# Patient Record
Sex: Male | Born: 1945 | Race: White | Hispanic: No | Smoking: Former smoker
Health system: Southern US, Community
[De-identification: ages and names within clinical notes are randomized; demographics above are authoritative.]

## PROBLEM LIST (undated history)

## (undated) DIAGNOSIS — J9691 Respiratory failure, unspecified with hypoxia: Secondary | ICD-10-CM

## (undated) DIAGNOSIS — Z8709 Personal history of other diseases of the respiratory system: Secondary | ICD-10-CM

## (undated) DIAGNOSIS — I10 Essential (primary) hypertension: Secondary | ICD-10-CM

## (undated) DIAGNOSIS — J449 Chronic obstructive pulmonary disease, unspecified: Secondary | ICD-10-CM

## (undated) DIAGNOSIS — Z9889 Other specified postprocedural states: Secondary | ICD-10-CM

## (undated) HISTORY — PX: HEMORRHOID SURGERY: SHX153

## (undated) HISTORY — DX: Other specified postprocedural states: Z98.890

## (undated) HISTORY — PX: CHOLECYSTECTOMY: SHX55

## (undated) HISTORY — DX: Essential (primary) hypertension: I10

## (undated) HISTORY — PX: CARDIAC CATHETERIZATION: SHX172

## (undated) HISTORY — DX: Respiratory failure, unspecified with hypoxia: J96.91

---

## 1996-10-28 HISTORY — PX: VASECTOMY: SHX75

## 2006-10-28 HISTORY — PX: HERNIA REPAIR: SHX51

## 2008-10-28 DIAGNOSIS — Z8709 Personal history of other diseases of the respiratory system: Secondary | ICD-10-CM

## 2008-10-28 HISTORY — DX: Personal history of other diseases of the respiratory system: Z87.09

## 2010-10-28 DIAGNOSIS — Z9889 Other specified postprocedural states: Secondary | ICD-10-CM

## 2010-10-28 HISTORY — DX: Other specified postprocedural states: Z98.890

## 2017-12-08 ENCOUNTER — Other Ambulatory Visit: Payer: Self-pay | Admitting: Urology

## 2017-12-12 NOTE — Patient Instructions (Addendum)
Timothy [name]  12/12/2017   Your procedure is scheduled on: 12-17-17   Report to [facility] Main  Entrance Report to Admitting at 6:30 AM   Call this number if you have problems the morning of surgery [phone]   Remember: Do not eat food or drink liquids :After Midnight.     Take these medicines the morning of surgery with A SIP OF WATER: None                                You may not have any metal on your body including hair pins and              piercings  Do not wear jewelry, lotions, powders or deodorant             Men may shave face and neck.   Do not bring valuables to the hospital. [place].  Contacts, dentures or bridgework may not be worn into surgery.      Patients discharged the day of surgery will not be allowed to drive home.  Name and phone number of your driver: Timothy [name] [phone]               Please read over the following fact sheets you were given: _____________________________________________________________________             [facility] - Preparing for Surgery Before surgery, you can play an important role.  Because skin is not sterile, your skin needs to be as [place] of germs as possible.  You can reduce the number of germs on your skin by washing with CHG (chlorahexidine gluconate) soap before surgery.  CHG is an antiseptic cleaner which kills germs and bonds with the skin to continue killing germs even after washing. Please DO NOT use if you have an allergy to CHG or antibacterial soaps.  If your skin becomes reddened/irritated stop using the CHG and inform your nurse when you arrive at Short Stay. Do not shave (including legs and underarms) for at least 48 hours prior to the first CHG shower.  You may shave your face/neck. Please follow these instructions carefully:  1.  Shower with CHG Soap the night before surgery and the  morning of Surgery.  2.  If you choose to  wash your hair, wash your hair first as usual with your  normal  shampoo.  3.  After you shampoo, rinse your hair and body thoroughly to remove the  shampoo.                           4.  Use CHG as you would any other liquid soap.  You can apply chg directly  to the skin and wash                       Gently with a scrungie or clean washcloth.  5.  Apply the CHG Soap to your body ONLY FROM THE NECK DOWN.   Do not use on face/ open                           Wound or open sores.  Avoid contact with eyes, ears mouth and genitals (private parts).                       Wash face,  Genitals (private parts) with your normal soap.             6.  Wash thoroughly, paying special attention to the area where your surgery  will be performed.  7.  Thoroughly rinse your body with warm water from the neck down.  8.  DO NOT shower/wash with your normal soap after using and rinsing off  the CHG Soap.                9.  Pat yourself dry with a clean towel.            10.  Wear clean pajamas.            11.  Place clean sheets on your bed the night of your first shower and do not  sleep with pets. Day of Surgery : Do not apply any lotions/deodorants the morning of surgery.  Please wear clean clothes to the hospital/surgery center.  FAILURE TO FOLLOW THESE INSTRUCTIONS MAY RESULT IN THE CANCELLATION OF YOUR SURGERY PATIENT SIGNATURE_________________________________  NURSE SIGNATURE__________________________________  ________________________________________________________________________

## 2017-12-15 ENCOUNTER — Encounter (HOSPITAL_COMMUNITY): Payer: Self-pay

## 2017-12-15 ENCOUNTER — Other Ambulatory Visit: Payer: Self-pay

## 2017-12-15 ENCOUNTER — Encounter (HOSPITAL_COMMUNITY)
Admission: RE | Admit: 2017-12-15 | Discharge: 2017-12-15 | Disposition: A | Discharge status: Home / Self Care | Payer: Medicare Other | Source: Ambulatory Visit | Attending: Urology | Admitting: Urology

## 2017-12-15 DIAGNOSIS — Z01812 Encounter for preprocedural laboratory examination: Secondary | ICD-10-CM | POA: Diagnosis not present

## 2017-12-15 DIAGNOSIS — I10 Essential (primary) hypertension: Secondary | ICD-10-CM | POA: Diagnosis not present

## 2017-12-15 DIAGNOSIS — J449 Chronic obstructive pulmonary disease, unspecified: Secondary | ICD-10-CM | POA: Diagnosis not present

## 2017-12-15 DIAGNOSIS — Z7951 Long term (current) use of inhaled steroids: Secondary | ICD-10-CM | POA: Diagnosis not present

## 2017-12-15 DIAGNOSIS — Z87891 Personal history of nicotine dependence: Secondary | ICD-10-CM | POA: Diagnosis not present

## 2017-12-15 DIAGNOSIS — Z9114 Patient's other noncompliance with medication regimen: Secondary | ICD-10-CM | POA: Diagnosis not present

## 2017-12-15 DIAGNOSIS — C674 Malignant neoplasm of posterior wall of bladder: Secondary | ICD-10-CM | POA: Diagnosis not present

## 2017-12-15 DIAGNOSIS — D494 Neoplasm of unspecified behavior of bladder: Secondary | ICD-10-CM | POA: Diagnosis present

## 2017-12-15 DIAGNOSIS — Z01810 Encounter for preprocedural cardiovascular examination: Secondary | ICD-10-CM | POA: Diagnosis not present

## 2017-12-15 DIAGNOSIS — Z77090 Contact with and (suspected) exposure to asbestos: Secondary | ICD-10-CM | POA: Diagnosis not present

## 2017-12-15 HISTORY — DX: Personal history of other diseases of the respiratory system: Z87.09

## 2017-12-15 HISTORY — DX: Chronic obstructive pulmonary disease, unspecified: J44.9

## 2017-12-15 LAB — CBC
HCT: 45.6 % (ref 39.0–52.0)
Hemoglobin: 15.3 g/dL (ref 13.0–17.0)
MCH: 31.3 pg (ref 26.0–34.0)
MCHC: 33.6 g/dL (ref 30.0–36.0)
MCV: 93.3 fL (ref 78.0–100.0)
PLATELETS: 212 10*3/uL (ref 150–400)
RBC: 4.89 MIL/uL (ref 4.22–5.81)
RDW: 13.4 % (ref 11.5–15.5)
WBC: 6.4 10*3/uL (ref 4.0–10.5)

## 2017-12-15 LAB — BASIC METABOLIC PANEL
Anion gap: 9 (ref 5–15)
BUN: 12 mg/dL (ref 6–20)
CALCIUM: 9.1 mg/dL (ref 8.9–10.3)
CO2: 25 mmol/L (ref 22–32)
CREATININE: 0.87 mg/dL (ref 0.61–1.24)
Chloride: 108 mmol/L (ref 101–111)
GFR calc Af Amer: >60 mL/min (ref >60)
Glucose, Bld: 103 mg/dL — ABNORMAL HIGH (ref 65–99)
Potassium: 4.9 mmol/L (ref 3.5–5.1)
SODIUM: 142 mmol/L (ref 135–145)

## 2017-12-15 NOTE — H&P (Signed)
Urology Preoperative H&P   Chief Complaint: bladder lesion  History of Present Illness: Timothy [name] is a 72 y.o. male initially seen in the [place], [place] emergency department in November 2018 with painless gross hematuria.  He had a CT performed at that time that demonstrated a large bladder lesion involving the posterior aspects of the bladder wall.  No other upper urinary tract lesions noted on his CT.  He previously smoked 2 ppd for 40 years, but quit in approximately 15 years ago.  He has no prior history of kidney stones or UTIs.  He denies a personal/family history of GU malignancies.       Past Medical History:  Diagnosis Date  . COPD (chronic obstructive pulmonary disease) ([place])   . History of asbestosis 2010  . Hypertension    2010-prescribed Lisinopril, non-complinant with medication    Past Surgical History:  Procedure Laterality Date  . CHOLECYSTECTOMY     2015  . HEMORRHOID SURGERY    . HERNIA REPAIR  2008   Abdominal   . VASECTOMY  1998    Allergies: No Known Allergies  No family history on file.  Social History:  reports that he quit smoking about 15 years ago. His smoking use included cigarettes. He has a 40.00 pack-year smoking history. he has never used smokeless tobacco. He reports that he does not use drugs. His alcohol history is not on file.  ROS: A complete review of systems was performed.  All systems are negative except for pertinent findings as noted.  Physical Exam:  Vital signs in last 24 hours: Temp:  [98.5 F (36.9 C)] 98.5 F (36.9 C) (02/18 1345) Pulse Rate:  [67] 67 (02/18 1345) Resp:  [20] 20 (02/18 1345) BP: (145)/(82) 145/82 (02/18 1345) SpO2:  [98 %] 98 % (02/18 1345) Weight:  [73.6 kg (162 lb 4 oz)] 73.6 kg (162 lb 4 oz) (02/18 1345) Constitutional:  Alert and oriented, No acute distress Cardiovascular: Regular rate and rhythm, No JVD Respiratory: Normal respiratory effort, Lungs clear bilaterally GI: Abdomen is soft, nontender,  nondistended, no abdominal masses GU: No CVA tenderness Lymphatic: No lymphadenopathy Neurologic: Grossly intact, no focal deficits Psychiatric: Normal mood and affect   Laboratory Data:  Recent Labs    12/15/17 1430  WBC 6.4  HGB 15.3  HCT 45.6  PLT 212    Recent Labs    12/15/17 1430  NA 142  K 4.9  CL 108  GLUCOSE 103*  BUN 12  CALCIUM 9.1  CREATININE 0.87     Results for orders placed or performed during the hospital encounter of 12/15/17 (from the past 24 hour(s))  Basic metabolic panel     Status: Abnormal   Collection Time: 12/15/17  2:30 PM  Result Value Ref Range   Sodium 142 135 - 145 mmol/L   Potassium 4.9 3.5 - 5.1 mmol/L   Chloride 108 101 - 111 mmol/L   CO2 25 22 - 32 mmol/L   Glucose, Bld 103 (H) 65 - 99 mg/dL   BUN 12 6 - 20 mg/dL   Creatinine, Ser 0.87 0.61 - 1.24 mg/dL   Calcium 9.1 8.9 - 10.3 mg/dL   GFR calc non Af Amer >60 >60 mL/min   GFR calc Af Amer >60 >60 mL/min   Anion gap 9 5 - 15  CBC     Status: None   Collection Time: 12/15/17  2:30 PM  Result Value Ref Range   WBC 6.4 4.0 - 10.5 K/uL  RBC 4.89 4.22 - 5.81 MIL/uL   Hemoglobin 15.3 13.0 - 17.0 g/dL   HCT 45.6 39.0 - 52.0 %   MCV 93.3 78.0 - 100.0 fL   MCH 31.3 26.0 - 34.0 pg   MCHC 33.6 30.0 - 36.0 g/dL   RDW 13.4 11.5 - 15.5 %   Platelets 212 150 - 400 K/uL   No results found for this or any previous visit (from the past 240 hour(s)).  Renal Function: Recent Labs    12/15/17 1430  CREATININE 0.87   Estimated Creatinine Clearance: 76.7 mL/min (by C-G formula based on SCr of 0.87 mg/dL).  Radiologic Imaging: No results found.  I independently reviewed the above imaging studies.  Assessment and Plan Son Barkan Prezioso is a 72 y.o. male with gross hematuria and a bladder lesion on recent CT   -The risks, benefits and alternatives of cystoscopy with transurethral resection of bladder tumor was discussed with the patient. Risks include bleeding, urinary tract infection,  bladder perforation, the need for multiple surgeries to fully resect the tumor, the possibility for prolonged ureteral catheterization and the inherent risks of general anesthesia. He voices understanding and wishes to proceed.   Ellison Hughs, MD 12/15/2017, 7:55 PM  [facility] Pager: [phone]

## 2017-12-16 NOTE — Anesthesia Preprocedure Evaluation (Addendum)
Anesthesia Evaluation  Patient identified by MRN, date of birth, ID band Patient awake    Reviewed: Allergy & Precautions, NPO status , Patient's Chart, lab work & pertinent test results  Airway Mallampati: II  TM Distance: >3 FB Neck ROM: Full    Dental  (+) Missing,    Pulmonary COPD, former smoker,     + decreased breath sounds      Cardiovascular hypertension, negative cardio ROS   Rhythm:Regular Rate:Normal     Neuro/Psych negative neurological ROS  negative psych ROS   GI/Hepatic negative GI ROS, Neg liver ROS,   Endo/Other  negative endocrine ROS  Renal/GU negative Renal ROS  negative genitourinary   Musculoskeletal negative musculoskeletal ROS (+)   Abdominal Normal abdominal exam  (+)   Peds  Hematology negative hematology ROS (+)   Anesthesia Other Findings   Reproductive/Obstetrics                            Lab Results  Component Value Date   WBC 6.4 12/15/2017   HGB 15.3 12/15/2017   HCT 45.6 12/15/2017   MCV 93.3 12/15/2017   PLT 212 12/15/2017   Lab Results  Component Value Date   CREATININE 0.87 12/15/2017   BUN 12 12/15/2017   NA 142 12/15/2017   K 4.9 12/15/2017   CL 108 12/15/2017   CO2 25 12/15/2017   No results found for: INR, PROTIME  EKG: normal sinus rhythm, PAC's noted.   Anesthesia Physical Anesthesia Plan  ASA: III  Anesthesia Plan: General   Post-op Pain Management:    Induction: Intravenous  PONV Risk Score and Plan: Treatment may vary due to age or medical condition, Ondansetron and Dexamethasone  Airway Management Planned: LMA  Additional Equipment:   Intra-op Plan:   Post-operative Plan: Extubation in OR  Informed Consent: I have reviewed the patients History and Physical, chart, labs and discussed the procedure including the risks, benefits and alternatives for the proposed anesthesia with the patient or authorized  representative who has indicated his/her understanding and acceptance.   Dental advisory given  Plan Discussed with: Anesthesiologist and CRNA  Anesthesia Plan Comments:         Anesthesia Quick Evaluation

## 2017-12-17 ENCOUNTER — Ambulatory Visit (HOSPITAL_COMMUNITY): Payer: Medicare Other

## 2017-12-17 ENCOUNTER — Ambulatory Visit (HOSPITAL_COMMUNITY): Payer: Medicare Other | Admitting: Anesthesiology

## 2017-12-17 ENCOUNTER — Encounter (HOSPITAL_COMMUNITY)
Admission: RE | Disposition: A | Discharge status: Home / Self Care | Payer: Self-pay | Source: Ambulatory Visit | Attending: Urology

## 2017-12-17 ENCOUNTER — Ambulatory Visit (HOSPITAL_COMMUNITY)
Admission: RE | Admit: 2017-12-17 | Discharge: 2017-12-17 | Disposition: A | Discharge status: Home / Self Care | Payer: Medicare Other | Source: Ambulatory Visit | Attending: Urology | Admitting: Urology

## 2017-12-17 ENCOUNTER — Encounter (HOSPITAL_COMMUNITY): Payer: Self-pay

## 2017-12-17 DIAGNOSIS — Z77090 Contact with and (suspected) exposure to asbestos: Secondary | ICD-10-CM | POA: Insufficient documentation

## 2017-12-17 DIAGNOSIS — I10 Essential (primary) hypertension: Secondary | ICD-10-CM | POA: Insufficient documentation

## 2017-12-17 DIAGNOSIS — C674 Malignant neoplasm of posterior wall of bladder: Secondary | ICD-10-CM | POA: Diagnosis not present

## 2017-12-17 DIAGNOSIS — Z7951 Long term (current) use of inhaled steroids: Secondary | ICD-10-CM | POA: Insufficient documentation

## 2017-12-17 DIAGNOSIS — Z01810 Encounter for preprocedural cardiovascular examination: Secondary | ICD-10-CM | POA: Insufficient documentation

## 2017-12-17 DIAGNOSIS — Z01812 Encounter for preprocedural laboratory examination: Secondary | ICD-10-CM | POA: Diagnosis not present

## 2017-12-17 DIAGNOSIS — Z9114 Patient's other noncompliance with medication regimen: Secondary | ICD-10-CM | POA: Insufficient documentation

## 2017-12-17 DIAGNOSIS — Z87891 Personal history of nicotine dependence: Secondary | ICD-10-CM | POA: Insufficient documentation

## 2017-12-17 DIAGNOSIS — J449 Chronic obstructive pulmonary disease, unspecified: Secondary | ICD-10-CM | POA: Insufficient documentation

## 2017-12-17 HISTORY — PX: CYSTOSCOPY W/ RETROGRADES: SHX1426

## 2017-12-17 HISTORY — PX: TRANSURETHRAL RESECTION OF BLADDER TUMOR: SHX2575

## 2017-12-17 SURGERY — TURBT (TRANSURETHRAL RESECTION OF BLADDER TUMOR)
Anesthesia: General

## 2017-12-17 MED ORDER — LACTATED RINGERS IV SOLN
INTRAVENOUS | Status: DC | PRN
Start: 2017-12-17 — End: 2017-12-17
  Administered 2017-12-17: 07:00:00 via INTRAVENOUS

## 2017-12-17 MED ORDER — ROCURONIUM BROMIDE 10 MG/ML (PF) SYRINGE
PREFILLED_SYRINGE | INTRAVENOUS | Status: AC
  Filled 2017-12-17: qty 5

## 2017-12-17 MED ORDER — IOHEXOL 300 MG/ML  SOLN
INTRAMUSCULAR | Status: DC | PRN
  Administered 2017-12-17: 10 mL

## 2017-12-17 MED ORDER — SUCCINYLCHOLINE CHLORIDE 200 MG/10ML IV SOSY
PREFILLED_SYRINGE | INTRAVENOUS | Status: AC
  Filled 2017-12-17: qty 10

## 2017-12-17 MED ORDER — HYDROMORPHONE HCL 1 MG/ML IJ SOLN
INTRAMUSCULAR | Status: AC
  Filled 2017-12-17: qty 1

## 2017-12-17 MED ORDER — HYDROMORPHONE HCL 1 MG/ML IJ SOLN
0.2500 mg | INTRAMUSCULAR | Status: DC | PRN
  Administered 2017-12-17: 0.25 mg via INTRAVENOUS

## 2017-12-17 MED ORDER — PHENAZOPYRIDINE HCL 200 MG PO TABS: 200.0000 mg | ORAL_TABLET | Freq: Three times a day (TID) | ORAL | 0 refills | Status: DC | PRN

## 2017-12-17 MED ORDER — PHENYLEPHRINE HCL 10 MG/ML IJ SOLN
INTRAMUSCULAR | Status: DC | PRN
  Administered 2017-12-17: 50 ug/min via INTRAVENOUS

## 2017-12-17 MED ORDER — ONDANSETRON HCL 4 MG/2ML IJ SOLN
INTRAMUSCULAR | Status: AC
  Filled 2017-12-17: qty 2

## 2017-12-17 MED ORDER — ONDANSETRON HCL 4 MG/2ML IJ SOLN
INTRAMUSCULAR | Status: DC | PRN
  Administered 2017-12-17: 4 mg via INTRAVENOUS

## 2017-12-17 MED ORDER — SUCCINYLCHOLINE CHLORIDE 20 MG/ML IJ SOLN
INTRAMUSCULAR | Status: DC | PRN
  Administered 2017-12-17: 120 mg via INTRAVENOUS

## 2017-12-17 MED ORDER — MEPERIDINE HCL 50 MG/ML IJ SOLN: 6.2500 mg | INTRAMUSCULAR | Status: DC | PRN

## 2017-12-17 MED ORDER — HYDROCODONE-ACETAMINOPHEN 5-325 MG PO TABS: 1.0000 | ORAL_TABLET | ORAL | 0 refills | Status: DC | PRN

## 2017-12-17 MED ORDER — LACTATED RINGERS IV SOLN: INTRAVENOUS | Status: DC

## 2017-12-17 MED ORDER — FENTANYL CITRATE (PF) 100 MCG/2ML IJ SOLN
INTRAMUSCULAR | Status: DC | PRN
  Administered 2017-12-17 (×2): 25 ug via INTRAVENOUS
  Administered 2017-12-17: 50 ug via INTRAVENOUS

## 2017-12-17 MED ORDER — PHENYLEPHRINE 40 MCG/ML (10ML) SYRINGE FOR IV PUSH (FOR BLOOD PRESSURE SUPPORT)
PREFILLED_SYRINGE | INTRAVENOUS | Status: AC
  Filled 2017-12-17: qty 10

## 2017-12-17 MED ORDER — PROPOFOL 10 MG/ML IV BOLUS
INTRAVENOUS | Status: AC
  Filled 2017-12-17: qty 20

## 2017-12-17 MED ORDER — PHENYLEPHRINE HCL 10 MG/ML IJ SOLN
INTRAMUSCULAR | Status: DC | PRN
  Administered 2017-12-17: 40 ug via INTRAVENOUS
  Administered 2017-12-17: 120 ug via INTRAVENOUS
  Administered 2017-12-17: 80 ug via INTRAVENOUS
  Administered 2017-12-17: 40 ug via INTRAVENOUS
  Administered 2017-12-17: 80 ug via INTRAVENOUS

## 2017-12-17 MED ORDER — PROPOFOL 10 MG/ML IV BOLUS
INTRAVENOUS | Status: DC | PRN
  Administered 2017-12-17: 160 mg via INTRAVENOUS
  Administered 2017-12-17: 40 mg via INTRAVENOUS

## 2017-12-17 MED ORDER — BELLADONNA ALKALOIDS-OPIUM 16.2-60 MG RE SUPP
RECTAL | Status: DC | PRN
  Administered 2017-12-17: 1 via RECTAL

## 2017-12-17 MED ORDER — SUGAMMADEX SODIUM 200 MG/2ML IV SOLN
INTRAVENOUS | Status: DC | PRN
  Administered 2017-12-17: 200 mg via INTRAVENOUS

## 2017-12-17 MED ORDER — DEXAMETHASONE SODIUM PHOSPHATE 10 MG/ML IJ SOLN
INTRAMUSCULAR | Status: AC
  Filled 2017-12-17: qty 1

## 2017-12-17 MED ORDER — SUGAMMADEX SODIUM 200 MG/2ML IV SOLN
INTRAVENOUS | Status: AC
  Filled 2017-12-17: qty 2

## 2017-12-17 MED ORDER — LIDOCAINE 2% (20 MG/ML) 5 ML SYRINGE
INTRAMUSCULAR | Status: AC
  Filled 2017-12-17: qty 5

## 2017-12-17 MED ORDER — CEFAZOLIN SODIUM-DEXTROSE 2-4 GM/100ML-% IV SOLN
2.0000 g | Freq: Once | INTRAVENOUS | Status: AC
  Administered 2017-12-17: 2 g via INTRAVENOUS
  Filled 2017-12-17: qty 100

## 2017-12-17 MED ORDER — SULFAMETHOXAZOLE-TRIMETHOPRIM 800-160 MG PO TABS: 1.0000 | ORAL_TABLET | Freq: Two times a day (BID) | ORAL | 0 refills | Status: AC

## 2017-12-17 MED ORDER — LIDOCAINE HCL (CARDIAC) 20 MG/ML IV SOLN
INTRAVENOUS | Status: DC | PRN
  Administered 2017-12-17 (×2): 50 mg via INTRAVENOUS

## 2017-12-17 MED ORDER — ROCURONIUM BROMIDE 100 MG/10ML IV SOLN
INTRAVENOUS | Status: DC | PRN
  Administered 2017-12-17: 30 mg via INTRAVENOUS

## 2017-12-17 MED ORDER — DEXAMETHASONE SODIUM PHOSPHATE 10 MG/ML IJ SOLN
INTRAMUSCULAR | Status: DC | PRN
  Administered 2017-12-17: 10 mg via INTRAVENOUS

## 2017-12-17 MED ORDER — OXYBUTYNIN CHLORIDE ER 10 MG PO TB24: 10.0000 mg | ORAL_TABLET | Freq: Every day | ORAL | 1 refills | Status: DC

## 2017-12-17 MED ORDER — PHENYLEPHRINE HCL 10 MG/ML IJ SOLN
INTRAMUSCULAR | Status: AC
  Filled 2017-12-17: qty 1

## 2017-12-17 MED ORDER — BELLADONNA-OPIUM 16.2-30 MG RE SUPP
RECTAL | Status: AC
  Filled 2017-12-17: qty 1

## 2017-12-17 MED ORDER — ONDANSETRON HCL 4 MG PO TABS: 4.0000 mg | ORAL_TABLET | Freq: Every day | ORAL | 1 refills | Status: DC | PRN

## 2017-12-17 MED ORDER — SODIUM CHLORIDE 0.9 % IR SOLN
Status: DC | PRN
  Administered 2017-12-17: 15000 mL via INTRAVESICAL

## 2017-12-17 MED ORDER — FENTANYL CITRATE (PF) 100 MCG/2ML IJ SOLN
INTRAMUSCULAR | Status: AC
  Filled 2017-12-17: qty 2

## 2017-12-17 MED ORDER — PROMETHAZINE HCL 25 MG/ML IJ SOLN: 6.2500 mg | INTRAMUSCULAR | Status: DC | PRN

## 2017-12-17 SURGICAL SUPPLY — 19 items
BAG URINE DRAINAGE (UROLOGICAL SUPPLIES) ×4 IMPLANT
BAG URO CATCHER STRL LF (MISCELLANEOUS) ×4 IMPLANT
CATH FOLEY 3WAY 30CC 20FR (CATHETERS) ×4 IMPLANT
CATH INTERMIT  6FR 70CM (CATHETERS) ×4 IMPLANT
CLOTH BEACON ORANGE TIMEOUT ST (SAFETY) ×4 IMPLANT
COVER FOOTSWITCH UNIV (MISCELLANEOUS) ×4 IMPLANT
COVER SURGICAL LIGHT HANDLE (MISCELLANEOUS) ×4 IMPLANT
ELECT REM PT RETURN 15FT ADLT (MISCELLANEOUS) ×4 IMPLANT
GLOVE BIOGEL M STRL SZ7.5 (GLOVE) ×4 IMPLANT
GOWN STRL REUS W/TWL LRG LVL3 (GOWN DISPOSABLE) ×8 IMPLANT
GOWN STRL REUS W/TWL XL LVL3 (GOWN DISPOSABLE) ×4 IMPLANT
GUIDEWIRE STR DUAL SENSOR (WIRE) ×4 IMPLANT
LOOP CUT BIPOLAR 24F LRG (ELECTROSURGICAL) ×4 IMPLANT
MANIFOLD NEPTUNE II (INSTRUMENTS) ×4 IMPLANT
PACK CYSTO (CUSTOM PROCEDURE TRAY) ×4 IMPLANT
SET ASPIRATION TUBING (TUBING) IMPLANT
SYRINGE IRR TOOMEY STRL 70CC (SYRINGE) ×4 IMPLANT
TUBING CONNECTING 10 (TUBING) ×3 IMPLANT
TUBING CONNECTING 10' (TUBING) ×1

## 2017-12-17 NOTE — Anesthesia Postprocedure Evaluation (Signed)
Anesthesia Post Note  Patient: Timothy [name]  Procedure(s) Performed: TRANSURETHRAL RESECTION OF BLADDER TUMOR (TURBT) (N/A ) CYSTOSCOPY WITH RETROGRADE PYELOGRAM (Bilateral )     Patient location during evaluation: PACU Anesthesia Type: General Level of consciousness: awake and alert Pain management: pain level controlled Vital Signs Assessment: post-procedure vital signs reviewed and stable Respiratory status: spontaneous breathing, nonlabored ventilation, respiratory function stable and patient connected to nasal cannula oxygen Cardiovascular status: blood pressure returned to baseline and stable Postop Assessment: no apparent nausea or vomiting Anesthetic complications: no    Last Vitals:  Vitals:   12/17/17 1100 12/17/17 1115  BP: (!) 175/89 (!) 175/92  Pulse: 78 93  Resp: 12 16  Temp: (!) 36.3 C   SpO2: 92% 90%    Last Pain:  Vitals:   12/17/17 1115  TempSrc:   PainSc: 0-No pain                 Effie Berkshire

## 2017-12-17 NOTE — Op Note (Signed)
Operative Note  Preoperative diagnosis:  1.  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  Cystoscopy with bilateral retrograde pyelograms 2.  TURBT medium  Surgeon: Ellison Hughs, MD  Assistants: None  Anesthesia: General endotracheal  Complications: None  EBL: 10 mL  Specimens: 1.  Superficial posterior bladder tumor 2.  Deep margin of posterior bladder tumor  Drains/Catheters: 1.  20 French three-way Foley catheter with 20 mL and the balloon  Intraoperative findings:  1.  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  Solitary right collecting system with no filling defects or dilation involving the right ureter or right renal pelvis seen on retrograde pyelogram 3.  Solitary left collecting system with no filling defects or dilation involving the left ureter or left renal pelvis seen on retrograde pyelogram  Indication:  Timothy [name] is a 72 y.o. male initially seen in the [place], [place] emergency department in November 2018 with painless gross hematuria.  He had a CT performed at that time that demonstrated a large bladder lesion involving the posterior aspects of the bladder wall.  No other upper urinary tract lesions noted on his CT.  He previously smoked 2 ppd for 40 years, but quit in approximately 15 years ago.  He has no prior history of kidney stones or UTIs.  He denies a personal/family history of GU malignancies.     He has been consented for the above procedures, voices understanding and wishes to proceed  Description of procedure:  After informed consent was obtained, the patient was brought to the operating room and general endotracheal anesthesia was administered. The patient was then placed in the dorsolithotomy position and prepped and draped in usual sterile fashion.  A timeout was performed. A 21 French rigid cystoscope was then inserted into the urethral meatus and advanced into the bladder under direct vision. 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  Using a 6 [place] open-ended catheter, bilateral retrograde pyelograms were obtained, with the findings listed above.  The rigid cystoscope was then exchanged for a 26 French resectoscope with a bipolar loop working element.  The posterior bladder tumor was then resected superficially and sent as a separate specimen.  I then resected the bladder tumor down to the detrusor musculature.  There are multiple bladder diverticula seen during the deep resection, but all tumor was removed from them.  There was no evidence of bladder perforation identified during cystoscopy.  The area of resection was then extensively fulgurated until hemostasis was achieved.  All bladder tumor was removed from the bladder.  A 20 French three-way Foley catheter was then inserted with the balloon inflated with 20 mL of sterile water.  The catheter was then placed to continuous bladder irrigation.  The patient tolerated the procedure well and was transferred to the postanesthesia unit in stable condition.    Plan: CBI and PACU.  Home with Foley catheter as long as his urine remains clear.  He will follow-up in the clinic in 1 week for a voiding trial and to discuss pathology results.

## 2017-12-17 NOTE — Interval H&P Note (Signed)
History and Physical Interval Note:  12/17/2017 8:36 AM  Timothy [name]  has presented today for surgery, with the diagnosis of BLADDER MASS  The various methods of treatment have been discussed with the patient and family. After consideration of risks, benefits and other options for treatment, the patient has consented to  Procedure(s): TRANSURETHRAL RESECTION OF BLADDER TUMOR (TURBT) (N/A) CYSTOSCOPY WITH RETROGRADE PYELOGRAM (Bilateral) as a surgical intervention .  The patient's history has been reviewed, patient examined, no change in status, stable for surgery.  I have reviewed the patient's chart and labs.  Questions were answered to the patient's satisfaction.     Conception Oms [place] Leap

## 2017-12-17 NOTE — Anesthesia Procedure Notes (Signed)
Procedure Name: Intubation Date/Time: 12/17/2017 8:43 AM Performed by: Glory Buff, CRNA Pre-anesthesia Checklist: Patient identified, Emergency Drugs available, Suction available and Patient being monitored Patient Re-evaluated:Patient Re-evaluated prior to induction Oxygen Delivery Method: Circle system utilized Preoxygenation: Pre-oxygenation with 100% oxygen Induction Type: IV induction Ventilation: Mask ventilation without difficulty Laryngoscope Size: Mac and 4 Tube type: Oral Tube size: 7.5 mm Number of attempts: 1 Airway Equipment and Method: Stylet and Oral airway Placement Confirmation: ETT inserted through vocal cords under direct vision,  positive ETCO2 and breath sounds checked- equal and bilateral Secured at: 21 cm Tube secured with: Tape Dental Injury: Teeth and Oropharynx as per pre-operative assessment  Comments: Placed by Dene Gentry SRNA

## 2017-12-17 NOTE — Transfer of Care (Signed)
Immediate Anesthesia Transfer of Care Note  Patient: Timothy [name]  Procedure(s) Performed: TRANSURETHRAL RESECTION OF BLADDER TUMOR (TURBT) (N/A ) CYSTOSCOPY WITH RETROGRADE PYELOGRAM (Bilateral )  Patient Location: PACU  Anesthesia Type:General  Level of Consciousness: awake, alert  and oriented  Airway & Oxygen Therapy: Patient Spontanous Breathing and Patient connected to face mask oxygen  Post-op Assessment: Report given to RN and Post -op Vital signs reviewed and stable  Post vital signs: Reviewed and stable  Last Vitals:  Vitals:   12/17/17 0622  BP: (!) 171/98  Pulse: 91  Resp: 20  Temp: 36.8 C  SpO2: 98%    Last Pain:  Vitals:   12/17/17 0622  TempSrc: Oral         Complications: No apparent anesthesia complications

## 2017-12-18 ENCOUNTER — Encounter (HOSPITAL_COMMUNITY): Payer: Self-pay | Admitting: Urology

## 2018-01-08 ENCOUNTER — Other Ambulatory Visit: Payer: Self-pay | Admitting: Urology

## 2018-01-28 NOTE — Patient Instructions (Addendum)
Timothy [name]  01/28/2018   Your procedure is scheduled on: Tuesday 02/03/2018  Report to [facility] Main  Entrance   Report to admitting at  0900  AM    Call this number if you have problems the morning of surgery [phone]    Remember: Do not eat food or drink liquids :After Midnight.     Take these medicines the morning of surgery with A SIP OF WATER: Use Albuterol and [place] nebulizer if needed                                 You may not have any metal on your body including hair pins and              piercings  Do not wear jewelry, make-up, lotions, powders or perfumes, deodorant             Do not wear nail polish.  Do not shave  48 hours prior to surgery.              Men may shave face and neck.   Do not bring valuables to the hospital. [place].  Contacts, dentures or bridgework may not be worn into surgery.  Leave suitcase in the car. After surgery it may be brought to your room.     Patients discharged the day of surgery will not be allowed to drive home.  Name and phone number of your driver: [name]                Please read over the following fact sheets you were given: _____________________________________________________________________             [facility] - Preparing for Surgery Before surgery, you can play an important role.  Because skin is not sterile, your skin needs to be as [place] of germs as possible.  You can reduce the number of germs on your skin by washing with CHG (chlorahexidine gluconate) soap before surgery.  CHG is an antiseptic cleaner which kills germs and bonds with the skin to continue killing germs even after washing. Please DO NOT use if you have an allergy to CHG or antibacterial soaps.  If your skin becomes reddened/irritated stop using the CHG and inform your nurse when you arrive at Short Stay. Do not shave (including legs and underarms) for at least  48 hours prior to the first CHG shower.  You may shave your face/neck. Please follow these instructions carefully:  1.  Shower with CHG Soap the night before surgery and the  morning of Surgery.  2.  If you choose to wash your hair, wash your hair first as usual with your  normal  shampoo.  3.  After you shampoo, rinse your hair and body thoroughly to remove the  shampoo.                           4.  Use CHG as you would any other liquid soap.  You can apply chg directly  to the skin and wash                       Gently with a  scrungie or clean washcloth.  5.  Apply the CHG Soap to your body ONLY FROM THE NECK DOWN.   Do not use on face/ open                           Wound or open sores. Avoid contact with eyes, ears mouth and genitals (private parts).                       Wash face,  Genitals (private parts) with your normal soap.             6.  Wash thoroughly, paying special attention to the area where your surgery  will be performed.  7.  Thoroughly rinse your body with warm water from the neck down.  8.  DO NOT shower/wash with your normal soap after using and rinsing off  the CHG Soap.                9.  Pat yourself dry with a clean towel.            10.  Wear clean pajamas.            11.  Place clean sheets on your bed the night of your first shower and do not  sleep with pets. Day of Surgery : Do not apply any lotions/deodorants the morning of surgery.  Please wear clean clothes to the hospital/surgery center.  FAILURE TO FOLLOW THESE INSTRUCTIONS MAY RESULT IN THE CANCELLATION OF YOUR SURGERY PATIENT SIGNATURE_________________________________  NURSE SIGNATURE__________________________________  ________________________________________________________________________

## 2018-01-28 NOTE — Progress Notes (Signed)
12/15/2017- noted in Epic- EKG

## 2018-01-29 ENCOUNTER — Other Ambulatory Visit: Payer: Self-pay

## 2018-01-29 ENCOUNTER — Encounter (HOSPITAL_COMMUNITY): Payer: Self-pay

## 2018-01-29 ENCOUNTER — Encounter (HOSPITAL_COMMUNITY)
Admission: RE | Admit: 2018-01-29 | Discharge: 2018-01-29 | Disposition: A | Discharge status: Home / Self Care | Payer: Medicare Other | Source: Ambulatory Visit | Attending: Urology | Admitting: Urology

## 2018-01-29 DIAGNOSIS — C679 Malignant neoplasm of bladder, unspecified: Secondary | ICD-10-CM | POA: Diagnosis not present

## 2018-01-29 DIAGNOSIS — Z01812 Encounter for preprocedural laboratory examination: Secondary | ICD-10-CM | POA: Diagnosis present

## 2018-01-29 LAB — CBC
HEMATOCRIT: 45.2 % (ref 39.0–52.0)
Hemoglobin: 14.9 g/dL (ref 13.0–17.0)
MCH: 31 pg (ref 26.0–34.0)
MCHC: 33 g/dL (ref 30.0–36.0)
MCV: 94.2 fL (ref 78.0–100.0)
Platelets: 195 10*3/uL (ref 150–400)
RBC: 4.8 MIL/uL (ref 4.22–5.81)
RDW: 13.8 % (ref 11.5–15.5)
WBC: 6.7 10*3/uL (ref 4.0–10.5)

## 2018-01-29 LAB — BASIC METABOLIC PANEL
Anion gap: 7 (ref 5–15)
BUN: 13 mg/dL (ref 6–20)
CALCIUM: 9.1 mg/dL (ref 8.9–10.3)
CO2: 28 mmol/L (ref 22–32)
CREATININE: 0.9 mg/dL (ref 0.61–1.24)
Chloride: 108 mmol/L (ref 101–111)
GFR calc Af Amer: >60 mL/min (ref >60)
GFR calc non Af Amer: >60 mL/min (ref >60)
GLUCOSE: 104 mg/dL — AB (ref 65–99)
Potassium: 5 mmol/L (ref 3.5–5.1)
Sodium: 143 mmol/L (ref 135–145)

## 2018-02-03 NOTE — Progress Notes (Signed)
Patient called with new date of surgery being 02/11/2018 and to ararive at 0900am for 1100am surgery and to follow same preop instructions as for surgery would have been on 02/04/2016.

## 2018-02-05 ENCOUNTER — Other Ambulatory Visit: Payer: Self-pay

## 2018-02-05 ENCOUNTER — Inpatient Hospital Stay (HOSPITAL_COMMUNITY)
Admission: EM | Admit: 2018-02-05 | Discharge: 2018-02-08 | DRG: 190 | Disposition: A | Discharge status: Home Health | Payer: Medicare Other | Attending: Internal Medicine | Admitting: Internal Medicine

## 2018-02-05 ENCOUNTER — Emergency Department (HOSPITAL_COMMUNITY): Payer: Medicare Other

## 2018-02-05 ENCOUNTER — Encounter (HOSPITAL_COMMUNITY): Payer: Self-pay | Admitting: Emergency Medicine

## 2018-02-05 DIAGNOSIS — J61 Pneumoconiosis due to asbestos and other mineral fibers: Secondary | ICD-10-CM | POA: Diagnosis present

## 2018-02-05 DIAGNOSIS — J9600 Acute respiratory failure, unspecified whether with hypoxia or hypercapnia: Secondary | ICD-10-CM | POA: Diagnosis present

## 2018-02-05 DIAGNOSIS — Z7951 Long term (current) use of inhaled steroids: Secondary | ICD-10-CM | POA: Diagnosis not present

## 2018-02-05 DIAGNOSIS — Z87891 Personal history of nicotine dependence: Secondary | ICD-10-CM

## 2018-02-05 DIAGNOSIS — T380X5A Adverse effect of glucocorticoids and synthetic analogues, initial encounter: Secondary | ICD-10-CM | POA: Diagnosis present

## 2018-02-05 DIAGNOSIS — Z79899 Other long term (current) drug therapy: Secondary | ICD-10-CM

## 2018-02-05 DIAGNOSIS — Z885 Allergy status to narcotic agent status: Secondary | ICD-10-CM | POA: Diagnosis not present

## 2018-02-05 DIAGNOSIS — R739 Hyperglycemia, unspecified: Secondary | ICD-10-CM | POA: Diagnosis present

## 2018-02-05 DIAGNOSIS — L989 Disorder of the skin and subcutaneous tissue, unspecified: Secondary | ICD-10-CM

## 2018-02-05 DIAGNOSIS — I5032 Chronic diastolic (congestive) heart failure: Secondary | ICD-10-CM | POA: Diagnosis present

## 2018-02-05 DIAGNOSIS — J441 Chronic obstructive pulmonary disease with (acute) exacerbation: Principal | ICD-10-CM | POA: Diagnosis present

## 2018-02-05 DIAGNOSIS — I10 Essential (primary) hypertension: Secondary | ICD-10-CM | POA: Diagnosis not present

## 2018-02-05 DIAGNOSIS — I110 Hypertensive heart disease with heart failure: Secondary | ICD-10-CM | POA: Diagnosis present

## 2018-02-05 DIAGNOSIS — L409 Psoriasis, unspecified: Secondary | ICD-10-CM | POA: Diagnosis present

## 2018-02-05 DIAGNOSIS — I447 Left bundle-branch block, unspecified: Secondary | ICD-10-CM | POA: Diagnosis present

## 2018-02-05 DIAGNOSIS — Z77090 Contact with and (suspected) exposure to asbestos: Secondary | ICD-10-CM | POA: Diagnosis present

## 2018-02-05 DIAGNOSIS — Z9981 Dependence on supplemental oxygen: Secondary | ICD-10-CM | POA: Diagnosis not present

## 2018-02-05 DIAGNOSIS — J449 Chronic obstructive pulmonary disease, unspecified: Secondary | ICD-10-CM | POA: Diagnosis present

## 2018-02-05 DIAGNOSIS — J9621 Acute and chronic respiratory failure with hypoxia: Secondary | ICD-10-CM | POA: Diagnosis not present

## 2018-02-05 DIAGNOSIS — R9431 Abnormal electrocardiogram [ECG] [EKG]: Secondary | ICD-10-CM | POA: Diagnosis present

## 2018-02-05 DIAGNOSIS — I361 Nonrheumatic tricuspid (valve) insufficiency: Secondary | ICD-10-CM | POA: Diagnosis not present

## 2018-02-05 LAB — COMPREHENSIVE METABOLIC PANEL
ALK PHOS: 53 U/L (ref 38–126)
ALT: 20 U/L (ref 17–63)
ANION GAP: 13 (ref 5–15)
AST: 32 U/L (ref 15–41)
Albumin: 3.6 g/dL (ref 3.5–5.0)
BILIRUBIN TOTAL: 0.7 mg/dL (ref 0.3–1.2)
BUN: 16 mg/dL (ref 6–20)
CALCIUM: 9 mg/dL (ref 8.9–10.3)
CO2: 23 mmol/L (ref 22–32)
CREATININE: 1.15 mg/dL (ref 0.61–1.24)
Chloride: 103 mmol/L (ref 101–111)
GFR calc non Af Amer: >60 mL/min (ref >60)
GLUCOSE: 187 mg/dL — AB (ref 65–99)
Potassium: 3.8 mmol/L (ref 3.5–5.1)
Sodium: 139 mmol/L (ref 135–145)
TOTAL PROTEIN: 7.3 g/dL (ref 6.5–8.1)

## 2018-02-05 LAB — CBC WITH DIFFERENTIAL/PLATELET
BASOS PCT: 0 %
Basophils Absolute: 0 10*3/uL (ref 0.0–0.1)
EOS ABS: 0.2 10*3/uL (ref 0.0–0.7)
EOS PCT: 2 %
HCT: 46 % (ref 39.0–52.0)
Hemoglobin: 14.4 g/dL (ref 13.0–17.0)
LYMPHS ABS: 4.2 10*3/uL — AB (ref 0.7–4.0)
Lymphocytes Relative: 41 %
MCH: 29.8 pg (ref 26.0–34.0)
MCHC: 31.3 g/dL (ref 30.0–36.0)
MCV: 95.2 fL (ref 78.0–100.0)
MONOS PCT: 10 %
Monocytes Absolute: 1 10*3/uL (ref 0.1–1.0)
Neutro Abs: 5 10*3/uL (ref 1.7–7.7)
Neutrophils Relative %: 47 %
PLATELETS: 228 10*3/uL (ref 150–400)
RBC: 4.83 MIL/uL (ref 4.22–5.81)
RDW: 14 % (ref 11.5–15.5)
WBC: 10.5 10*3/uL (ref 4.0–10.5)

## 2018-02-05 LAB — I-STAT TROPONIN, ED: TROPONIN I, POC: 0.03 ng/mL (ref 0.00–0.08)

## 2018-02-05 LAB — BRAIN NATRIURETIC PEPTIDE: B Natriuretic Peptide: 128 pg/mL — ABNORMAL HIGH (ref 0.0–100.0)

## 2018-02-05 LAB — PHOSPHORUS: Phosphorus: 6 mg/dL — ABNORMAL HIGH (ref 2.5–4.6)

## 2018-02-05 LAB — MAGNESIUM: Magnesium: 2.2 mg/dL (ref 1.7–2.4)

## 2018-02-05 MED ORDER — ACETAMINOPHEN 650 MG RE SUPP: 650.0000 mg | Freq: Four times a day (QID) | RECTAL | Status: DC | PRN

## 2018-02-05 MED ORDER — SODIUM CHLORIDE 0.9 % IV BOLUS
500.0000 mL | Freq: Once | INTRAVENOUS | Status: AC
Start: 2018-02-05 — End: 2018-02-05
  Administered 2018-02-05: 500 mL via INTRAVENOUS

## 2018-02-05 MED ORDER — ALBUTEROL SULFATE (2.5 MG/3ML) 0.083% IN NEBU: 2.5000 mg | INHALATION_SOLUTION | RESPIRATORY_TRACT | Status: DC | PRN

## 2018-02-05 MED ORDER — ACETAMINOPHEN 325 MG PO TABS: 650.0000 mg | ORAL_TABLET | Freq: Four times a day (QID) | ORAL | Status: DC | PRN

## 2018-02-05 MED ORDER — IPRATROPIUM-ALBUTEROL 0.5-2.5 (3) MG/3ML IN SOLN
3.0000 mL | Freq: Once | RESPIRATORY_TRACT | Status: AC
Start: 2018-02-05 — End: 2018-02-05
  Administered 2018-02-05: 3 mL via RESPIRATORY_TRACT
  Filled 2018-02-05: qty 3

## 2018-02-05 MED ORDER — METHYLPREDNISOLONE SODIUM SUCC 40 MG IJ SOLR
40.0000 mg | Freq: Four times a day (QID) | INTRAMUSCULAR | Status: AC
  Administered 2018-02-06 (×4): 40 mg via INTRAVENOUS
  Filled 2018-02-05 (×4): qty 1

## 2018-02-05 MED ORDER — IPRATROPIUM-ALBUTEROL 0.5-2.5 (3) MG/3ML IN SOLN
3.0000 mL | Freq: Four times a day (QID) | RESPIRATORY_TRACT | Status: DC
Start: 2018-02-06 — End: 2018-02-08
  Administered 2018-02-06 – 2018-02-08 (×10): 3 mL via RESPIRATORY_TRACT
  Filled 2018-02-05 (×10): qty 3

## 2018-02-05 MED ORDER — IPRATROPIUM-ALBUTEROL 0.5-2.5 (3) MG/3ML IN SOLN
3.0000 mL | Freq: Once | RESPIRATORY_TRACT | Status: AC
  Administered 2018-02-05: 3 mL via RESPIRATORY_TRACT
  Filled 2018-02-05: qty 3

## 2018-02-05 MED ORDER — MAGNESIUM SULFATE 2 GM/50ML IV SOLN
2.0000 g | Freq: Once | INTRAVENOUS | Status: AC
  Administered 2018-02-06: 2 g via INTRAVENOUS
  Filled 2018-02-05: qty 50

## 2018-02-05 MED ORDER — ONDANSETRON HCL 4 MG PO TABS: 4.0000 mg | ORAL_TABLET | Freq: Four times a day (QID) | ORAL | Status: DC | PRN

## 2018-02-05 MED ORDER — ALBUTEROL SULFATE (2.5 MG/3ML) 0.083% IN NEBU: 2.5000 mg | INHALATION_SOLUTION | Freq: Four times a day (QID) | RESPIRATORY_TRACT | Status: DC

## 2018-02-05 MED ORDER — ONDANSETRON HCL 4 MG/2ML IJ SOLN
4.0000 mg | Freq: Four times a day (QID) | INTRAMUSCULAR | Status: DC | PRN
Start: 2018-02-05 — End: 2018-02-08

## 2018-02-05 MED ORDER — IPRATROPIUM BROMIDE 0.02 % IN SOLN: 0.5000 mg | Freq: Four times a day (QID) | RESPIRATORY_TRACT | Status: DC

## 2018-02-05 MED ORDER — ENOXAPARIN SODIUM 40 MG/0.4ML ~~LOC~~ SOLN
40.0000 mg | SUBCUTANEOUS | Status: DC
  Administered 2018-02-06 – 2018-02-07 (×3): 40 mg via SUBCUTANEOUS
  Filled 2018-02-05 (×3): qty 0.4

## 2018-02-05 MED ORDER — POTASSIUM CHLORIDE IN NACL 20-0.45 MEQ/L-% IV SOLN
INTRAVENOUS | Status: DC
  Administered 2018-02-06 (×2): via INTRAVENOUS
  Filled 2018-02-05 (×3): qty 1000

## 2018-02-05 NOTE — ED Notes (Signed)
Per pt request, called Tomi Bamberger at [phone] to notify where pt is, she states they have been at [facility] in the waiting room bc they thought that is where he was since they live just down the street, advised pt is here in room 1 and stable (oked by pt to discuss), family on the way, pt notified of ph call

## 2018-02-05 NOTE — H&P (Signed)
History and Physical    Timothy [name] EGB:[id number] DOB: 1946/08/25 DOA: 02/05/2018  PCP: Patient, [name]   Patient coming from: Home.  I have personally briefly reviewed patient's old medical records in [place]  Chief Complaint: Shortness of breath.  HPI: Timothy [name] is a 72 y.o. male with medical history significant of COPD, asbestosis, hypertension who is coming to the emergency department complaints of shortness of breath since about 1700 today that happened while he was eating watching TV.  He reports his chest felt tight and he was mildly dyspneic.  He tried an albuterol nebulizer treatment without any significant results.  Then he subsequently turned on the oxygen machine on, but this did not help as well.  He mentions that after this he started feeling warm, anxious and was feeling palpitations.  His son subsequently called the patient's ex-wife, which in turn called 44.  When EMS arrived, they found the patient tachypneic gave him a 5 mg albuterol neb, Solu-Medrol 125 in place and on CPAP ventilation.  He denies fever, chills, sore throat, runny nose, lightheadedness, chest pain, PND, orthopnea or pitting edema of the lower extremities.  No abdominal pain, no nausea or emesis, no constipation or diarrhea, no melena or hematochezia.  Denies dysuria, frequency or hematuria.  No polyuria, polydipsia or blurred vision.  He complains of recurrent lower extremities erythematosus rashes, which are recurring at this time.  ED Course: Initial vital signs temperature 35.9C (96.7 F), pulse 128, respirations 18, blood pressure 152/106 mmHg and O2 sat 98% on CPAP.  He was put on BiPAP ventilation and given 2 DuoNeb's.  I added magnesium sulfate 2 g IVPB.  White count was 10.5 with a normal differential, hemoglobin 14.4 g/dL and platelets 228.  His CMP showed a glucose of 187 mg/dL, but all other values were normal.  BNP was 128.0 pg/mL.  EKG was sinus tachycardiac with LBBB.  Chest  radiograph show hyperinflation with emphysematous disease coarse interstitial opacity at the bases.  Please see images and full radiology report for further detail.  Review of Systems: As per HPI otherwise 10 point review of systems negative.    Past Medical History:  Diagnosis Date  . COPD (chronic obstructive pulmonary disease) ([place])   . History of asbestosis 2010  . Hypertension    2010-prescribed Lisinopril, non-complinant with medication    Past Surgical History:  Procedure Laterality Date  . CARDIAC CATHETERIZATION     x 2  . CHOLECYSTECTOMY     2015  . CYSTOSCOPY W/ RETROGRADES Bilateral 12/17/2017   Procedure: CYSTOSCOPY WITH RETROGRADE PYELOGRAM;  Surgeon: Ceasar Mons, MD;  Location: [facility];  Service: Urology;  Laterality: Bilateral;  . HEMORRHOID SURGERY    . HERNIA REPAIR  2008   Abdominal   . TRANSURETHRAL RESECTION OF BLADDER TUMOR N/A 12/17/2017   Procedure: TRANSURETHRAL RESECTION OF BLADDER TUMOR (TURBT);  Surgeon: Ceasar Mons, MD;  Location: [facility];  Service: Urology;  Laterality: N/A;  . [place]     reports that he quit smoking about 15 years ago. His smoking use included cigarettes. He has a 40.00 pack-year smoking history. He has never used smokeless tobacco. He reports that he drinks about 0.6 oz of alcohol per week. He reports that he does not use drugs.  Allergies  Allergen Reactions  . Codeine Itching    Family History  Problem Relation Age of Onset  . Lung disease Mother  Asbestosis.  . Pancreatic cancer Father     Prior to Admission medications   Medication Sig Start Date End Date Taking? Authorizing Provider  albuterol (PROVENTIL) (2.5 MG/3ML) 0.083% nebulizer solution Take 2.5 mg by nebulization every 6 (six) hours as needed for wheezing or shortness of breath.   Yes [provider]  arformoterol (BROVANA) 15 MCG/2ML NEBU Take 15 mcg by nebulization 2 (two) times daily as needed (up to twice  daily (scheduled once daily)).    Yes [provider]  budesonide (PULMICORT) 0.5 MG/2ML nebulizer solution Take 0.5 mg by nebulization 2 (two) times daily as needed (up to twice daily (scheduled once a day)).    Yes [provider]  ibuprofen (ADVIL,MOTRIN) 200 MG tablet Take 600 mg by mouth daily as needed for headache or moderate pain.   Yes [provider]  HYDROcodone-acetaminophen (NORCO) 5-325 MG tablet Take 1 tablet by mouth every 4 (four) hours as needed for moderate pain. Patient not taking: Reported on 01/23/2018 12/17/17   Ceasar Mons, MD  ondansetron (ZOFRAN) 4 MG tablet Take 1 tablet (4 mg total) by mouth daily as needed for nausea or vomiting. Patient not taking: Reported on 01/23/2018 12/17/17 12/17/18  Ceasar Mons, MD  oxybutynin (DITROPAN XL) 10 MG 24 hr tablet Take 1 tablet (10 mg total) by mouth at bedtime. Patient not taking: Reported on 01/23/2018 12/17/17   Ceasar Mons, MD  phenazopyridine (PYRIDIUM) 200 MG tablet Take 1 tablet (200 mg total) by mouth 3 (three) times daily as needed for pain. Patient not taking: Reported on 01/23/2018 12/17/17 12/17/18  Ceasar Mons, MD    Physical Exam: Vitals:   02/05/18 1932 02/05/18 1933 02/05/18 1938 02/05/18 1945  BP: (!) 152/106     Pulse: (!) 134  (!) 124   Resp: 20  (!) 24   Temp: (!) 96.7 F (35.9 C)     TempSrc: Oral     SpO2: 99%  99% 99%  Weight:  73.5 kg (162 lb)    Height:  5' 8.5" (1.74 m)      Constitutional: NAD, calm, comfortable Eyes: PERRL, lids and conjunctivae normal ENMT: Mucous membranes are moist. Posterior pharynx clear of any exudate or lesions. Neck: normal, supple, no masses, no thyromegaly Respiratory: Decreased breath sounds with bibasilar crackles.  Bilateral wheezing.  Normal respiratory effort. No accessory muscle use.  Cardiovascular: Regular rate and rhythm, no murmurs / rubs / gallops. No extremity edema. 2+ pedal  pulses. No carotid bruits.  Abdomen: Soft, no tenderness, no masses palpated. No hepatosplenomegaly. Bowel sounds positive.  Musculoskeletal: no clubbing / cyanosis. Good ROM, no contractures. Normal muscle tone.  Skin: Upper extremities shows ecchymosis from venipuncture, particularly on the left.  Positive abrassion like lesion on left thigh.  Bilateral pretibial erythema areas.  Please see images below for further detail. Neurologic: CN 2-12 grossly intact. Sensation intact, DTR normal. Strength 5/5 in all 4.  Psychiatric: Normal judgment and insight. Alert and oriented x 3. Normal mood.    Left thigh lower medial area.   Left lower extremity mid lateral area.   Right lower extremity mid lateral pretibial area.   Labs on Admission: I have personally reviewed following labs and imaging studies  CBC: Recent Labs  Lab 02/05/18 1940  WBC 10.5  NEUTROABS 5.0  HGB 14.4  HCT 46.0  MCV 95.2  PLT 623   Basic Metabolic Panel: Recent Labs  Lab 02/05/18 1940  NA 139  K 3.8  CL 103  CO2 23  GLUCOSE 187*  BUN 16  CREATININE 1.15  CALCIUM 9.0   GFR: Estimated Creatinine Clearance: 58 mL/min (by C-G formula based on SCr of 1.15 mg/dL). Liver Function Tests: Recent Labs  Lab 02/05/18 1940  AST 32  ALT 20  ALKPHOS 53  BILITOT 0.7  PROT 7.3  ALBUMIN 3.6   No results for input(s): LIPASE, AMYLASE in the last 168 hours. No results for input(s): AMMONIA in the last 168 hours. Coagulation Profile: No results for input(s): INR, PROTIME in the last 168 hours. Cardiac Enzymes: No results for input(s): CKTOTAL, CKMB, CKMBINDEX, TROPONINI in the last 168 hours. BNP (last 3 results) No results for input(s): PROBNP in the last 8760 hours. HbA1C: No results for input(s): HGBA1C in the last 72 hours. CBG: No results for input(s): GLUCAP in the last 168 hours. Lipid Profile: No results for input(s): CHOL, HDL, LDLCALC, TRIG, CHOLHDL, LDLDIRECT in the last 72 hours. Thyroid  Function Tests: No results for input(s): TSH, T4TOTAL, FREET4, T3FREE, THYROIDAB in the last 72 hours. Anemia Panel: No results for input(s): VITAMINB12, FOLATE, FERRITIN, TIBC, IRON, RETICCTPCT in the last 72 hours. Urine analysis: No results found for: COLORURINE, APPEARANCEUR, LABSPEC, PHURINE, GLUCOSEU, HGBUR, BILIRUBINUR, KETONESUR, PROTEINUR, UROBILINOGEN, NITRITE, LEUKOCYTESUR  Radiological Exams on Admission: Dg Chest Portable 1 View  Result Date: 02/05/2018 CLINICAL DATA:  Shortness of breath for 1 hour EXAM: PORTABLE CHEST 1 VIEW COMPARISON:  CT 10/30/2017 FINDINGS: Hyperinflation with emphysematous disease. Coarse bibasilar interstitial opacity. No focal airspace disease or pleural effusion. Normal heart size. No pneumothorax. IMPRESSION: 1. Hyperinflation with emphysematous disease 2. Coarse interstitial opacity at the bases, likely chronic change, although difficult to exclude mild acute superimposed edema or interstitial inflammation without priors. Electronically Signed   By: Donavan Foil M.D.   On: 02/05/2018 19:54   EKG: Independently reviewed.  Vent. rate 143 BPM PR interval * ms QRS duration 134 ms QT/QTc 326/503 ms P-R-T axes 0 -11 109 Sinus tachycardia Probable left atrial enlargement Left bundle branch block  Assessment/Plan Principal Problem:   Acute respiratory failure (HCC) Admit to stepdown/inpatient. Continue supplemental oxygen. Continue BiPAP ventilation now and as needed. Albuterol 2.5 mg plus ipratropium 0.5 mg 4 times a day. Albuterol 2.5 mg every 4 hours as needed. Continue Solu-Medrol 40 mg IVP every 6 hours x4 doses. Follow-up blood glucose closely.  Active Problems:   COPD with acute exacerbation ([place]) Continue treatment as above.    Hypertension  Not on medical therapy. Monitor blood pressure.    Abnormal EKG Check echocardiogram. Advised the patient to see cardiology as an outpatient.    Hyperphosphatemia Likely as a result of  albuterol and dyspnea Follow-up level in the morning. Expand workup, if level still elevated.    Skin lesions Per patient, he has had these recurrent lesions for many decades. Not on flexural surfaces, but looks like psoriasis. Continue local care and dressing as needed. Consider topical corticosteroid if it worsens. I advised the patient to see a dermatologist for further evaluation.    Hyperglycemia This is nonfasting and after Solu-Medrol. Follow-up glucose level in a.m. Check hemoglobin A1c in a.m. Will order carbohydrate modified diet.   DVT prophylaxis: Lovenox SQ. Code Status: Full code. Family Communication:  Disposition Plan: Admit to stepdown for BiPAP ventilation and COPD treatment. Consults called:  Admission status: Inpatient/stepdown.   Reubin Milan MD [facility] Pager [phone].  If 7PM-7AM, please contact night-coverage [email or web address] Password [facility]  02/05/2018, 9:52 PM

## 2018-02-05 NOTE — ED Provider Notes (Signed)
Emergency Department Provider Note   I have reviewed the triage vital signs and the nursing notes.   HISTORY  Chief Complaint Respiratory Distress   HPI Timothy [name] is a 72 y.o. 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  The patient has home oxygen to use as needed so he applied this and used his albuterol nebulizer with no relief.  When his symptoms continued he called for EMS.  On arrival they report decreased breath sounds throughout with significant increased work of breathing.  He was given 5 mg of albuterol, 125 Solu-Medrol, and started on CPAP.  Patient's symptoms have improved since starting CPAP.  He denies any gradually progressing symptoms at home.  No fevers or chills.  No productive cough.  He is not experiencing any chest pain or heart palpitations.   Past Medical History:  Diagnosis Date  . COPD (chronic obstructive pulmonary disease) ([place])   . History of asbestosis 2010  . Hypertension    2010-prescribed Lisinopril, non-complinant with medication    Patient Active Problem List   Diagnosis Date Noted  . COPD with acute exacerbation ([place]) 02/06/2018  . Hypertension 02/06/2018  . Abnormal EKG 02/06/2018  . Hyperphosphatemia 02/06/2018  . Skin lesions 02/06/2018  . Hyperglycemia 02/06/2018  . Acute respiratory failure ([place]) 02/05/2018    Past Surgical History:  Procedure Laterality Date  . CARDIAC CATHETERIZATION     x 2  . CHOLECYSTECTOMY     2015  . CYSTOSCOPY W/ RETROGRADES Bilateral 12/17/2017   Procedure: CYSTOSCOPY WITH RETROGRADE PYELOGRAM;  Surgeon: Ceasar Mons, MD;  Location: [facility];  Service: Urology;  Laterality: Bilateral;  . HEMORRHOID SURGERY    . HERNIA REPAIR  2008   Abdominal   . TRANSURETHRAL RESECTION OF BLADDER TUMOR N/A 12/17/2017   Procedure: TRANSURETHRAL RESECTION OF BLADDER TUMOR (TURBT);  Surgeon: Ceasar Mons, MD;  Location: [facility];  Service: Urology;  Laterality: N/A;  . VASECTOMY  1998      Allergies Codeine  Family History  Problem Relation Age of Onset  . Lung disease Mother        Asbestosis.  . Pancreatic cancer Father     Social History Social History   Tobacco Use  . Smoking status: Former Smoker    Packs/day: 1.00    Years: 40.00    Pack years: 40.00    Types: Cigarettes    Last attempt to quit: 2004    Years since quitting: 15.2  . Smokeless tobacco: Never Used  Substance Use Topics  . Alcohol use: Yes    Alcohol/week: 0.6 oz    Types: 1 Cans of beer per week    Comment: occas  . Drug use: No    Review of Systems  Constitutional: No fever/chills Eyes: No visual changes. ENT: No sore throat. Cardiovascular: Denies chest pain. Respiratory: Positive shortness of breath. Gastrointestinal: No abdominal pain.  No nausea, no vomiting.  No diarrhea.  No constipation. Genitourinary: Negative for dysuria. Musculoskeletal: Negative for back pain. Skin: Negative for rash. Neurological: Negative for headaches, focal weakness or numbness.  10-point ROS otherwise negative.  ____________________________________________   PHYSICAL EXAM:  VITAL SIGNS: Vitals:   02/06/18 0755 02/06/18 0904  BP:    Pulse:    Resp:    Temp: 97.8 F (36.6 C)   SpO2:  96%    Constitutional: Alert but in acute respiratory distress. Patient on  CPAP.  Eyes: Conjunctivae are normal.  Head: Atraumatic. Nose: No congestion/rhinnorhea. Mouth/Throat: Mucous membranes are moist. Neck: No stridor.  Cardiovascular: Tachcyardia. Good peripheral circulation. Grossly normal heart sounds.   Respiratory: Significant increased respiratory effort with subcostal retractions. Lungs with diminished air entry bilaterally.  Gastrointestinal: Soft and nontender. No distention.  Musculoskeletal: No lower extremity tenderness nor edema. No gross deformities of extremities. Neurologic:  Normal  speech and language. No gross focal neurologic deficits are appreciated.  Skin:  Skin is warm, dry and intact. No rash noted.  ____________________________________________   LABS (all labs ordered are listed, but only abnormal results are displayed)  Labs Reviewed  COMPREHENSIVE METABOLIC PANEL - Abnormal; Notable for the following components:      Result Value   Glucose, Bld 187 (*)    All other components within normal limits  BRAIN NATRIURETIC PEPTIDE - Abnormal; Notable for the following components:   B Natriuretic Peptide 128.0 (*)    All other components within normal limits  CBC WITH DIFFERENTIAL/PLATELET - Abnormal; Notable for the following components:   Lymphs Abs 4.2 (*)    All other components within normal limits  PHOSPHORUS - Abnormal; Notable for the following components:   Phosphorus 6.0 (*)    All other components within normal limits  CBC WITH DIFFERENTIAL/PLATELET - Abnormal; Notable for the following components:   Lymphs Abs 0.6 (*)    Monocytes Absolute 0.0 (*)    All other components within normal limits  BASIC METABOLIC PANEL - Abnormal; Notable for the following components:   Glucose, Bld 171 (*)    Calcium 8.3 (*)    All other components within normal limits  GLUCOSE, CAPILLARY - Abnormal; Notable for the following components:   Glucose-Capillary 145 (*)    All other components within normal limits  MRSA PCR SCREENING  MAGNESIUM  PHOSPHORUS  HEMOGLOBIN A1C  I-STAT TROPONIN, ED  I-STAT TROPONIN, ED   ____________________________________________  EKG   EKG Interpretation  Date/Time:  Thursday February 05 2018 19:31:26 EDT Ventricular Rate:  143 PR Interval:    QRS Duration: 134 QT Interval:  326 QTC Calculation: 503 R Axis:   -11 Text Interpretation:  Sinus tachycardia Probable left atrial enlargement Left bundle branch block No STEMI.  Confirmed by Nanda Quinton [phone]) on 02/05/2018 7:37:31 PM        ____________________________________________  RADIOLOGY  Dg Chest Portable 1 View  Result Date: 02/05/2018 CLINICAL DATA:  Shortness of breath for 1 hour EXAM: PORTABLE CHEST 1 VIEW COMPARISON:  CT 10/30/2017 FINDINGS: Hyperinflation with emphysematous disease. Coarse bibasilar interstitial opacity. No focal airspace disease or pleural effusion. Normal heart size. No pneumothorax. IMPRESSION: 1. Hyperinflation with emphysematous disease 2. Coarse interstitial opacity at the bases, likely chronic change, although difficult to exclude mild acute superimposed edema or interstitial inflammation without priors. Electronically Signed   By: Donavan Foil M.D.   On: 02/05/2018 19:54    ____________________________________________   PROCEDURES  Procedure(s) performed:   .Critical Care Performed by: Margette Fast, MD Authorized by: Margette Fast, MD   Critical care provider statement:    Critical care time (minutes):  35   Critical care time was exclusive of:  Separately billable procedures and treating other patients and teaching time   Critical care was necessary to treat or prevent imminent or life-threatening deterioration of the following conditions:  Respiratory failure   Critical care was time spent personally by me on the following activities:  Blood draw for specimens, development of treatment  plan with patient or surrogate, evaluation of patient's response to treatment, examination of patient, obtaining history from patient or surrogate, ordering and performing treatments and interventions, ordering and review of laboratory studies, ordering and review of radiographic studies, pulse oximetry, re-evaluation of patient's condition and review of old charts   I assumed direction of critical care for this patient from another provider in my specialty: no      ____________________________________________   INITIAL IMPRESSION / ASSESSMENT AND PLAN / ED COURSE  Pertinent labs &  imaging results that were available during my care of the patient were reviewed by me and considered in my medical decision making (see chart for details).  Patient with past history of COPD presents with acute onset respiratory difficulty.  He has decreased air entry throughout with accessory muscle use on arrival.  He is able to provide a full history and is speaking in short sentences through the CPAP machine.  EKG shows a left bundle with no acute ischemic changes.  He is afebrile here with significant tachycardia.  I have transition the patient to BiPAP and will give additional nebulizer therapy.  The patient received Solu-Medrol in route with EMS.   Patient is feeling much better after additional nebs and BiPAP. WOB significantly reduced. Labs reviewed with no acute findings. CXR with chronic changes but nothing acute. Patient will be admitted for BiPAP weaning and further treatment.   Discussed patient's case with Hospitalist, Dr. Olevia Bowens to request admission. Patient and family (if present) updated with plan. Care transferred to Hospitalist service.  I reviewed all nursing notes, vitals, pertinent old records, EKGs, labs, imaging (as available).  ____________________________________________  FINAL CLINICAL IMPRESSION(S) / ED DIAGNOSES  Final diagnoses:  COPD exacerbation ([place])  Acute respiratory failure, unspecified whether with hypoxia or hypercapnia ([place])     MEDICATIONS GIVEN DURING THIS VISIT:  Medications  enoxaparin (LOVENOX) injection 40 mg (40 mg Subcutaneous Given 02/06/18 0105)  0.45 % NaCl with KCl 20 mEq / L infusion ( Intravenous Rate/Dose Verify 02/06/18 0400)  acetaminophen (TYLENOL) tablet 650 mg (has no administration in time range)    Or  acetaminophen (TYLENOL) suppository 650 mg (has no administration in time range)  ondansetron (ZOFRAN) tablet 4 mg (has no administration in time range)    Or  ondansetron (ZOFRAN) injection 4 mg (has no administration in time  range)  albuterol (PROVENTIL) (2.5 MG/3ML) 0.083% nebulizer solution 2.5 mg (has no administration in time range)  methylPREDNISolone sodium succinate (SOLU-MEDROL) 40 mg/mL injection 40 mg (40 mg Intravenous Given 02/06/18 0552)  insulin aspart (novoLOG) injection 0-15 Units (has no administration in time range)  ipratropium-albuterol (DUONEB) 0.5-2.5 (3) MG/3ML nebulizer solution 3 mL (3 mLs Nebulization Given 02/06/18 0901)  MEDLINE mouth rinse (15 mLs Mouth Rinse Not Given 02/06/18 0137)  sodium chloride 0.9 % bolus 500 mL (0 mLs Intravenous Stopped 02/05/18 2023)  ipratropium-albuterol (DUONEB) 0.5-2.5 (3) MG/3ML nebulizer solution 3 mL (3 mLs Nebulization Given 02/05/18 1943)  ipratropium-albuterol (DUONEB) 0.5-2.5 (3) MG/3ML nebulizer solution 3 mL (3 mLs Nebulization Given 02/05/18 1942)  magnesium sulfate IVPB 2 g 50 mL (0 g Intravenous Stopped 02/06/18 0203)    Note:  This document was prepared using Dragon voice recognition software and may include unintentional dictation errors.  Nanda Quinton, MD Emergency Medicine    Mazen Marcin, [facility], MD 02/06/18 [phone]

## 2018-02-05 NOTE — ED Triage Notes (Signed)
Per [facility] pt from home with hx of COPD, diminished on arrival, Sinus Tach at 150, given 5 albuterol and 125 Solumedrol, pt placed on CPap with improved O2 increased to 95%

## 2018-02-06 ENCOUNTER — Encounter (HOSPITAL_COMMUNITY): Payer: Self-pay | Admitting: Internal Medicine

## 2018-02-06 ENCOUNTER — Inpatient Hospital Stay (HOSPITAL_COMMUNITY): Payer: Medicare Other

## 2018-02-06 DIAGNOSIS — J441 Chronic obstructive pulmonary disease with (acute) exacerbation: Secondary | ICD-10-CM | POA: Diagnosis present

## 2018-02-06 DIAGNOSIS — I361 Nonrheumatic tricuspid (valve) insufficiency: Secondary | ICD-10-CM

## 2018-02-06 DIAGNOSIS — J449 Chronic obstructive pulmonary disease, unspecified: Secondary | ICD-10-CM | POA: Diagnosis present

## 2018-02-06 DIAGNOSIS — R9431 Abnormal electrocardiogram [ECG] [EKG]: Secondary | ICD-10-CM | POA: Diagnosis present

## 2018-02-06 DIAGNOSIS — J9621 Acute and chronic respiratory failure with hypoxia: Secondary | ICD-10-CM

## 2018-02-06 DIAGNOSIS — R739 Hyperglycemia, unspecified: Secondary | ICD-10-CM | POA: Diagnosis present

## 2018-02-06 DIAGNOSIS — I10 Essential (primary) hypertension: Secondary | ICD-10-CM | POA: Diagnosis present

## 2018-02-06 DIAGNOSIS — L989 Disorder of the skin and subcutaneous tissue, unspecified: Secondary | ICD-10-CM

## 2018-02-06 DIAGNOSIS — I5032 Chronic diastolic (congestive) heart failure: Secondary | ICD-10-CM | POA: Diagnosis present

## 2018-02-06 LAB — CBC WITH DIFFERENTIAL/PLATELET
BASOS ABS: 0 10*3/uL (ref 0.0–0.1)
Basophils Relative: 0 %
EOS PCT: 0 %
Eosinophils Absolute: 0 10*3/uL (ref 0.0–0.7)
HCT: 42.4 % (ref 39.0–52.0)
Hemoglobin: 13.2 g/dL (ref 13.0–17.0)
LYMPHS PCT: 11 %
Lymphs Abs: 0.6 10*3/uL — ABNORMAL LOW (ref 0.7–4.0)
MCH: 29.4 pg (ref 26.0–34.0)
MCHC: 31.1 g/dL (ref 30.0–36.0)
MCV: 94.4 fL (ref 78.0–100.0)
Monocytes Absolute: 0 10*3/uL — ABNORMAL LOW (ref 0.1–1.0)
Monocytes Relative: 0 %
NEUTROS ABS: 5.5 10*3/uL (ref 1.7–7.7)
Neutrophils Relative %: 89 %
PLATELETS: 203 10*3/uL (ref 150–400)
RBC: 4.49 MIL/uL (ref 4.22–5.81)
RDW: 14.1 % (ref 11.5–15.5)
WBC: 6.1 10*3/uL (ref 4.0–10.5)

## 2018-02-06 LAB — ECHOCARDIOGRAM COMPLETE
Height: 68 in
WEIGHTICAEL: 2613.77 [oz_av]

## 2018-02-06 LAB — PHOSPHORUS: PHOSPHORUS: 2.8 mg/dL (ref 2.5–4.6)

## 2018-02-06 LAB — GLUCOSE, CAPILLARY
GLUCOSE-CAPILLARY: 145 mg/dL — AB (ref 65–99)
GLUCOSE-CAPILLARY: 131 mg/dL — AB (ref 65–99)
Glucose-Capillary: 166 mg/dL — ABNORMAL HIGH (ref 65–99)
Glucose-Capillary: 137 mg/dL — ABNORMAL HIGH (ref 65–99)

## 2018-02-06 LAB — BASIC METABOLIC PANEL
ANION GAP: 10 (ref 5–15)
BUN: 15 mg/dL (ref 6–20)
CO2: 22 mmol/L (ref 22–32)
Calcium: 8.3 mg/dL — ABNORMAL LOW (ref 8.9–10.3)
Chloride: 107 mmol/L (ref 101–111)
Creatinine, Ser: 0.86 mg/dL (ref 0.61–1.24)
GFR calc Af Amer: >60 mL/min (ref >60)
GLUCOSE: 171 mg/dL — AB (ref 65–99)
POTASSIUM: 4.5 mmol/L (ref 3.5–5.1)
Sodium: 139 mmol/L (ref 135–145)

## 2018-02-06 LAB — HEMOGLOBIN A1C
Hgb A1c MFr Bld: 5 % (ref 4.8–5.6)
MEAN PLASMA GLUCOSE: 96.8 mg/dL

## 2018-02-06 LAB — MRSA PCR SCREENING: MRSA BY PCR: NEGATIVE

## 2018-02-06 MED ORDER — ORAL CARE MOUTH RINSE: 15.0000 mL | Freq: Two times a day (BID) | OROMUCOSAL | Status: DC

## 2018-02-06 MED ORDER — INSULIN ASPART 100 UNIT/ML ~~LOC~~ SOLN
0.0000 [IU] | Freq: Three times a day (TID) | SUBCUTANEOUS | Status: DC
  Administered 2018-02-06 (×2): 2 [IU] via SUBCUTANEOUS
  Administered 2018-02-06: 3 [IU] via SUBCUTANEOUS
  Administered 2018-02-07 – 2018-02-08 (×2): 2 [IU] via SUBCUTANEOUS

## 2018-02-06 MED ORDER — FUROSEMIDE 10 MG/ML IJ SOLN
20.0000 mg | Freq: Once | INTRAMUSCULAR | Status: AC
  Administered 2018-02-06: 20 mg via INTRAVENOUS
  Filled 2018-02-06: qty 2

## 2018-02-06 MED ORDER — ALPRAZOLAM 0.5 MG PO TABS: 0.5000 mg | ORAL_TABLET | Freq: Once | ORAL | Status: DC

## 2018-02-06 NOTE — Progress Notes (Signed)
PROGRESS NOTE  Timothy [name] LKG:[id number] DOB: Feb 14, 1946 DOA: 02/05/2018 PCP: Patient, [name]  HPI/Recap of past 62 hours: 72 year old male with past medical history of COPD, asbestosis and hypertension presented to the emergency room on 4/11 with complaints of shortness of breath that started earlier that day causing causing dyspnea on exertion.  By paramedics, patient found to have acute respiratory failure requiring nebulizers, steroids & requiring CPAP/BiPAP.  No events overnight.  Able to be weaned off of BiPAP and currently on 1-2 L nasal cannula. (Baseline is as needed 1 L at home) breathing much more comfortably.  Patient admits his baseline is not great and he gets easily winded at home, even walking short distances.  Echocardiogram noted chronic diastolic dysfunction.  Assessment/Plan: Principal Problem:   Acute on chronic respiratory failure (HCC) secondary to COPD with acute exacerbation, underlying asbestosis and possibly mild acute diastolic heart failure: We will try 1 dose of Lasix and see output.  Much more stable.  Transfer to floor.  Finished 1 day round of steroids.  Continue nebulizers. Active Problems:   COPD with acute exacerbation ([place])   Hypertension: Stable   Abnormal EKG: Left bundle branch block.  Will ask cardiology to see.  Block not present 2 months ago.   Hyperphosphatemia: Initially elevated at 6.0.  Possibly secondary albuterol, now normalized   Skin lesions: Ongoing, not on the extensor surfaces, so I do not think psoriasis.  One small lesion noted on his chest on the left side near collarbone looks almost like eczema.  Outpatient follow-up with dermatology   Hyperglycemia: A1c of 5.0.  Likely secondary to steroids.  Stable   Chronic diastolic heart failure ([place]): Instantly noted on echocardiogram.  We will try 1 dose of IV Lasix   Code Status: Full code  Family Communication: Left message for family  Disposition Plan: Potential discharge tomorrow  if able to ambulate on current oxygen   Consultants:  Cardiology  Procedures:  Echocardiogram done 4/12: Preserved ejection fraction, diastolic dysfunction  Antimicrobials:  None  DVT prophylaxis: Lovenox   Objective: Vitals:   02/06/18 0455 02/06/18 0755 02/06/18 0904 02/06/18 1251  BP:      Pulse:      Resp:      Temp: 98 F (36.7 C) 97.8 F (36.6 C)  98 F (36.7 C)  TempSrc: Oral Oral  Oral  SpO2:   96%   Weight: 74.1 kg (163 lb 5.8 oz)     Height:        Intake/Output Summary (Last 24 hours) at 02/06/2018 1258 Last data filed at 02/06/2018 1251 Gross per 24 hour  Intake 1571.67 ml  Output 400 ml  Net 1171.67 ml   Filed Weights   02/05/18 1933 02/06/18 0053 02/06/18 0455  Weight: 73.5 kg (162 lb) 74.1 kg (163 lb 5.8 oz) 74.1 kg (163 lb 5.8 oz)   Body mass index is 24.84 kg/m.  Exam:   General: Alert and oriented x3, no acute distress  HEENT: Normocephalic and atraumatic mucous members are moist  Neck: Supple, no JVD  Cardiovascular: Regular rate and rhythm, S1-S2  Respiratory: Decreased breath sounds throughout  Abdomen: Soft, nontender, nondistended, positive bowel sounds  Musculoskeletal: 1-2+ pitting edema from knees down bilaterally  Skin: Noted small erythematous lesions on anterior aspects of both legs below the knees as well as smaller circumferential old erythematous lesion above left collarbone  Neuro: No focal deficits  Psychiatry: Appropriate, no evidence of psychosis   Data Reviewed: CBC:  Recent Labs  Lab 02/05/18 1940 02/06/18 0357  WBC 10.5 6.1  NEUTROABS 5.0 5.5  HGB 14.4 13.2  HCT 46.0 42.4  MCV 95.2 94.4  PLT 228 650   Basic Metabolic Panel: Recent Labs  Lab 02/05/18 1940 02/06/18 0357  NA 139 139  K 3.8 4.5  CL 103 107  CO2 23 22  GLUCOSE 187* 171*  BUN 16 15  CREATININE 1.15 0.86  CALCIUM 9.0 8.3*  MG 2.2  --   PHOS 6.0* 2.8   GFR: Estimated Creatinine Clearance: 76.2 mL/min (by C-G formula  based on SCr of 0.86 mg/dL). Liver Function Tests: Recent Labs  Lab 02/05/18 1940  AST 32  ALT 20  ALKPHOS 53  BILITOT 0.7  PROT 7.3  ALBUMIN 3.6   No results for input(s): LIPASE, AMYLASE in the last 168 hours. No results for input(s): AMMONIA in the last 168 hours. Coagulation Profile: No results for input(s): INR, PROTIME in the last 168 hours. Cardiac Enzymes: No results for input(s): CKTOTAL, CKMB, CKMBINDEX, TROPONINI in the last 168 hours. BNP (last 3 results) No results for input(s): PROBNP in the last 8760 hours. HbA1C: Recent Labs    02/06/18 0357  HGBA1C 5.0   CBG: Recent Labs  Lab 02/06/18 0827 02/06/18 1149  GLUCAP 145* 166*   Lipid Profile: No results for input(s): CHOL, HDL, LDLCALC, TRIG, CHOLHDL, LDLDIRECT in the last 72 hours. Thyroid Function Tests: No results for input(s): TSH, T4TOTAL, FREET4, T3FREE, THYROIDAB in the last 72 hours. Anemia Panel: No results for input(s): VITAMINB12, FOLATE, FERRITIN, TIBC, IRON, RETICCTPCT in the last 72 hours. Urine analysis: No results found for: COLORURINE, APPEARANCEUR, LABSPEC, PHURINE, GLUCOSEU, HGBUR, BILIRUBINUR, KETONESUR, PROTEINUR, UROBILINOGEN, NITRITE, LEUKOCYTESUR Sepsis Labs: @LABRCNTIP (procalcitonin:4,lacticidven:4)  ) Recent Results (from the past 240 hour(s))  MRSA PCR Screening     Status: None   Collection Time: 02/06/18 12:08 AM  Result Value Ref Range Status   MRSA by PCR NEGATIVE NEGATIVE Final    Comment:        The GeneXpert MRSA Assay (FDA approved for NASAL specimens only), is one component of a comprehensive MRSA colonization surveillance program. It is not intended to diagnose MRSA infection nor to guide or monitor treatment for MRSA infections. Performed at [facility], [address], [place], [place] [address]       Studies: Dg Chest Portable 1 View  Result Date: 02/05/2018 CLINICAL DATA:  Shortness of breath for 1 hour EXAM: PORTABLE CHEST 1 VIEW COMPARISON:  CT  10/30/2017 FINDINGS: Hyperinflation with emphysematous disease. Coarse bibasilar interstitial opacity. No focal airspace disease or pleural effusion. Normal heart size. No pneumothorax. IMPRESSION: 1. Hyperinflation with emphysematous disease 2. Coarse interstitial opacity at the bases, likely chronic change, although difficult to exclude mild acute superimposed edema or interstitial inflammation without priors. Electronically Signed   By: Donavan Foil M.D.   On: 02/05/2018 19:54    Scheduled Meds: . enoxaparin (LOVENOX) injection  40 mg Subcutaneous Q24H  . furosemide  20 mg Intravenous Once  . insulin aspart  0-15 Units Subcutaneous TID WC  . ipratropium-albuterol  3 mL Nebulization Q6H  . mouth rinse  15 mL Mouth Rinse BID  . methylPREDNISolone (SOLU-MEDROL) injection  40 mg Intravenous Q6H    Continuous Infusions:   LOS: 1 day     Annita Brod, MD [facility]  To reach me or the doctor on call, go to: [email or web address] Password [email or web address]  02/06/2018, 12:58 PM

## 2018-02-06 NOTE — Progress Notes (Signed)
BIPAP on standby. Pt tolerating [place] well

## 2018-02-06 NOTE — Care Management Important Message (Signed)
Important Message  Patient Details  Name: Timothy [name] MRN: [id number] Date of Birth: 07/27/1946   Medicare Important Message Given:  Yes    Shelda Altes 02/06/2018, 12:56 PM

## 2018-02-06 NOTE — Progress Notes (Signed)
Nutrition Brief Note  Nutrition Screen performed as part of COPD Gold Protocol  Wt Readings from Last 15 Encounters:  02/06/18 163 lb 5.8 oz (74.1 kg)  12/17/17 162 lb 4 oz (73.6 kg)  12/15/17 162 lb 4 oz (73.6 kg)   Body mass index is 24.84 kg/m. Patient meets criteria for healthy wt/ht  based on current BMI.   Patient is seen at lunch time. He ate 100 of lunch and 100% of breakfast.   He says his appetite is excellent and he never has any trouble with eating at all. He denies his COPD having any impact on his appetite whatsoever.   He denies any weight changes. His UBW is 162.   Patient is doing excellent nutritionally. No nutrition interventions warranted at this time. If nutrition issues arise, please consult RD.   Burtis Junes RD, LDN, CNSC Clinical Nutrition Available Tues-Sat via Pager: [phone] 02/06/2018 12:21 PM

## 2018-02-06 NOTE — Progress Notes (Signed)
*  PRELIMINARY RESULTS* Echocardiogram 2D Echocardiogram has been performed.  Timothy [name] 02/06/2018, 9:09 AM

## 2018-02-06 NOTE — Progress Notes (Signed)
Ambulated patient around unit once. Half was while on 2L [place] sats were between 94-96% and the other half pt ambulated without oxygen and sat maintained between 92 and 93%. By the end of walking pt wob had increased and he was pursed lip breathing. His sats were between 88-91% but he knew that they were dropping and he knew how to slow his breathing to catch his breath. He has no complaints and in no signs of distress. Gait was steady, denies any dizziness. Pt uses o2 at home as needed.   Ellamae Lybeck Rica Mote, RN

## 2018-02-07 DIAGNOSIS — L989 Disorder of the skin and subcutaneous tissue, unspecified: Secondary | ICD-10-CM

## 2018-02-07 LAB — CBC WITH DIFFERENTIAL/PLATELET
Basophils Absolute: 0 10*3/uL (ref 0.0–0.1)
Basophils Relative: 0 %
Eosinophils Absolute: 0 10*3/uL (ref 0.0–0.7)
Eosinophils Relative: 0 %
HEMATOCRIT: 40.3 % (ref 39.0–52.0)
HEMOGLOBIN: 12.9 g/dL — AB (ref 13.0–17.0)
Lymphocytes Relative: 9 %
Lymphs Abs: 1.4 10*3/uL (ref 0.7–4.0)
MCH: 30 pg (ref 26.0–34.0)
MCHC: 32 g/dL (ref 30.0–36.0)
MCV: 93.7 fL (ref 78.0–100.0)
MONOS PCT: 5 %
Monocytes Absolute: 0.8 10*3/uL (ref 0.1–1.0)
NEUTROS ABS: 14.1 10*3/uL — AB (ref 1.7–7.7)
NEUTROS PCT: 86 %
Platelets: 218 10*3/uL (ref 150–400)
RBC: 4.3 MIL/uL (ref 4.22–5.81)
RDW: 14.4 % (ref 11.5–15.5)
WBC: 16.3 10*3/uL — AB (ref 4.0–10.5)

## 2018-02-07 LAB — GLUCOSE, CAPILLARY
GLUCOSE-CAPILLARY: 134 mg/dL — AB (ref 65–99)
Glucose-Capillary: 119 mg/dL — ABNORMAL HIGH (ref 65–99)
Glucose-Capillary: 88 mg/dL (ref 65–99)

## 2018-02-07 MED ORDER — ASPIRIN 81 MG PO CHEW
81.0000 mg | CHEWABLE_TABLET | Freq: Every day | ORAL | Status: DC
  Administered 2018-02-07 – 2018-02-08 (×2): 81 mg via ORAL
  Filled 2018-02-07 (×2): qty 1

## 2018-02-07 MED ORDER — METHYLPREDNISOLONE SODIUM SUCC 40 MG IJ SOLR
40.0000 mg | Freq: Four times a day (QID) | INTRAMUSCULAR | Status: AC
  Administered 2018-02-07 – 2018-02-08 (×4): 40 mg via INTRAVENOUS
  Filled 2018-02-07 (×4): qty 1

## 2018-02-07 MED ORDER — FUROSEMIDE 10 MG/ML IJ SOLN
40.0000 mg | Freq: Once | INTRAMUSCULAR | Status: AC
  Administered 2018-02-07: 40 mg via INTRAVENOUS
  Filled 2018-02-07: qty 4

## 2018-02-07 NOTE — Progress Notes (Signed)
[facility] PROGRESS NOTE  Timothy [name] KYH:[id number] DOB: May 29, 1946 DOA: 02/05/2018 PCP: Patient, [name]  Brief summary   72 year old male with past medical history of COPD, asbestosis, chronic intermittent oxygen use,  and hypertension presented to the emergency room on 4/11 with complaints of shortness of breath that started earlier that day causing causing dyspnea on exertion.  By paramedics, patient found to have acute respiratory failure requiring nebulizers, steroids & requiring CPAP/BiPAP.   Assessment/Plan:  Principal Problem:  Acute on chronic respiratory failure (HCC) secondary to COPD with acute exacerbation, underlying asbestosis and possibly mild acute diastolic heart failure: remains congested on exam, will try another dose of lasix today. Wean to oral steroids in 24-48 hrs.  Continue nebulizers. weaned from oxygen, Transitioned to room air   Active Problems:  COPD with acute exacerbation ([place]). As above. Needs advair at discharge  Hypertension: Stable Hyperphosphatemia: Initially elevated at 6.0.  Possibly secondary albuterol, now normalized Skin lesions: Ongoing, not on the extensor surfaces, so I do not think psoriasis.  One small lesion noted on his chest on the left side near collarbone looks almost like eczema.  Outpatient follow-up with dermatology Hyperglycemia: A1c of 5.0.  Likely secondary to steroids.  Stable Heart failure, suspect systolic HF, possible mild subacute. echo: ef-40%. Regional wall motionabnormality: Akinesis of the apical  myocardium. clinically congested. We will try 1 dose of IV Lasix. Will consider ace./arb if BP tolerates.  -Abnormal EKG: Left bundle branch block.  Will ask cardiology to see for ischemic work up.  Block not present 2 months ago. Start aspirin. not on BB due to severe  COPD    Code Status: full Family Communication: d/w patient (indicate person spoken with, relationship, and if by phone, the number) Disposition  Plan: home in 24-48 hrs    Consultants:  Cardiology   Procedures:  Echo   Antibiotics: Anti-infectives (From admission, onward)   None        (indicate start date, and stop date if known)  HPI/Subjective: Reports dyspnea, improving. No acute chest pains.    Objective: Vitals:   02/07/18 0537 02/07/18 0746  BP: 125/76   Pulse: 78   Resp: 18   Temp: 97.9 F (36.6 C)   SpO2: 97% 97%    Intake/Output Summary (Last 24 hours) at 02/07/2018 0954 Last data filed at 02/06/2018 1922 Gross per 24 hour  Intake 840 ml  Output 1100 ml  Net -260 ml   Filed Weights   02/05/18 1933 02/06/18 0053 02/06/18 0455  Weight: 73.5 kg (162 lb) 74.1 kg (163 lb 5.8 oz) 74.1 kg (163 lb 5.8 oz)    Exam:   General:  No distress  Cardiovascular: s1,s2 rrr  Respiratory: crackles LL. Poor ventilation   Abdomen: soft, nt  Musculoskeletal: no leg edema    Data Reviewed: Basic Metabolic Panel: Recent Labs  Lab 02/05/18 1940 02/06/18 0357  NA 139 139  K 3.8 4.5  CL 103 107  CO2 23 22  GLUCOSE 187* 171*  BUN 16 15  CREATININE 1.15 0.86  CALCIUM 9.0 8.3*  MG 2.2  --   PHOS 6.0* 2.8   Liver Function Tests: Recent Labs  Lab 02/05/18 1940  AST 32  ALT 20  ALKPHOS 53  BILITOT 0.7  PROT 7.3  ALBUMIN 3.6   No results for input(s): LIPASE, AMYLASE in the last 168 hours. No results for input(s): AMMONIA in the last 168 hours. CBC: Recent Labs  Lab 02/05/18 1940 02/06/18 0357  02/07/18 0609  WBC 10.5 6.1 16.3*  NEUTROABS 5.0 5.5 14.1*  HGB 14.4 13.2 12.9*  HCT 46.0 42.4 40.3  MCV 95.2 94.4 93.7  PLT 228 203 218   Cardiac Enzymes: No results for input(s): CKTOTAL, CKMB, CKMBINDEX, TROPONINI in the last 168 hours. BNP (last 3 results) Recent Labs    02/05/18 1940  BNP 128.0*    ProBNP (last 3 results) No results for input(s): PROBNP in the last 8760 hours.  CBG: Recent Labs  Lab 02/06/18 0827 02/06/18 1149 02/06/18 1701 02/06/18 2059 02/07/18 0739   GLUCAP 145* 166* 137* 131* 119*    Recent Results (from the past 240 hour(s))  MRSA PCR Screening     Status: None   Collection Time: 02/06/18 12:08 AM  Result Value Ref Range Status   MRSA by PCR NEGATIVE NEGATIVE Final    Comment:        The GeneXpert MRSA Assay (FDA approved for NASAL specimens only), is one component of a comprehensive MRSA colonization surveillance program. It is not intended to diagnose MRSA infection nor to guide or monitor treatment for MRSA infections. Performed at [facility] [address], [place], [place] [address]      Studies: Dg Chest Portable 1 View  Result Date: 02/05/2018 CLINICAL DATA:  Shortness of breath for 1 hour EXAM: PORTABLE CHEST 1 VIEW COMPARISON:  CT 10/30/2017 FINDINGS: Hyperinflation with emphysematous disease. Coarse bibasilar interstitial opacity. No focal airspace disease or pleural effusion. Normal heart size. No pneumothorax. IMPRESSION: 1. Hyperinflation with emphysematous disease 2. Coarse interstitial opacity at the bases, likely chronic change, although difficult to exclude mild acute superimposed edema or interstitial inflammation without priors. Electronically Signed   By: Donavan Foil M.D.   On: 02/05/2018 19:54    Scheduled Meds: . enoxaparin (LOVENOX) injection  40 mg Subcutaneous Q24H  . insulin aspart  0-15 Units Subcutaneous TID WC  . ipratropium-albuterol  3 mL Nebulization Q6H  . mouth rinse  15 mL Mouth Rinse BID   Continuous Infusions:  Principal Problem:   Acute respiratory failure (HCC) Active Problems:   COPD with acute exacerbation (HCC)   Hypertension   Abnormal EKG   Hyperphosphatemia   Skin lesions   Hyperglycemia   Chronic diastolic heart failure ([place])    Time spent: >35 minutes     Kinnie Feil  [facility] Pager [phone]. If 7PM-7AM, please contact night-coverage at [email or web address], password [facility] 02/07/2018, 9:54 AM  LOS: 2 days

## 2018-02-08 DIAGNOSIS — J441 Chronic obstructive pulmonary disease with (acute) exacerbation: Principal | ICD-10-CM

## 2018-02-08 DIAGNOSIS — R9431 Abnormal electrocardiogram [ECG] [EKG]: Secondary | ICD-10-CM

## 2018-02-08 DIAGNOSIS — J9600 Acute respiratory failure, unspecified whether with hypoxia or hypercapnia: Secondary | ICD-10-CM

## 2018-02-08 DIAGNOSIS — I5032 Chronic diastolic (congestive) heart failure: Secondary | ICD-10-CM

## 2018-02-08 DIAGNOSIS — I10 Essential (primary) hypertension: Secondary | ICD-10-CM

## 2018-02-08 LAB — CBC WITH DIFFERENTIAL/PLATELET
Basophils Absolute: 0 10*3/uL (ref 0.0–0.1)
Basophils Relative: 0 %
EOS ABS: 0 10*3/uL (ref 0.0–0.7)
Eosinophils Relative: 0 %
HCT: 43.2 % (ref 39.0–52.0)
HEMOGLOBIN: 13.5 g/dL (ref 13.0–17.0)
LYMPHS ABS: 1.1 10*3/uL (ref 0.7–4.0)
LYMPHS PCT: 9 %
MCH: 29.5 pg (ref 26.0–34.0)
MCHC: 31.3 g/dL (ref 30.0–36.0)
MCV: 94.3 fL (ref 78.0–100.0)
Monocytes Absolute: 0.3 10*3/uL (ref 0.1–1.0)
Monocytes Relative: 3 %
NEUTROS PCT: 88 %
Neutro Abs: 10.7 10*3/uL — ABNORMAL HIGH (ref 1.7–7.7)
Platelets: 245 10*3/uL (ref 150–400)
RBC: 4.58 MIL/uL (ref 4.22–5.81)
RDW: 14.5 % (ref 11.5–15.5)
WBC: 12.2 10*3/uL — ABNORMAL HIGH (ref 4.0–10.5)

## 2018-02-08 LAB — GLUCOSE, CAPILLARY
GLUCOSE-CAPILLARY: 129 mg/dL — AB (ref 65–99)
Glucose-Capillary: 120 mg/dL — ABNORMAL HIGH (ref 65–99)

## 2018-02-08 MED ORDER — ARFORMOTEROL TARTRATE 15 MCG/2ML IN NEBU: 15.0000 ug | INHALATION_SOLUTION | Freq: Two times a day (BID) | RESPIRATORY_TRACT | Status: DC

## 2018-02-08 MED ORDER — TIOTROPIUM BROMIDE MONOHYDRATE 18 MCG IN CAPS: 18.0000 ug | ORAL_CAPSULE | Freq: Every day | RESPIRATORY_TRACT | 2 refills | Status: DC

## 2018-02-08 MED ORDER — BISOPROLOL FUMARATE 5 MG PO TABS
5.0000 mg | ORAL_TABLET | Freq: Every day | ORAL | Status: DC
  Administered 2018-02-08: 5 mg via ORAL
  Filled 2018-02-08: qty 1

## 2018-02-08 MED ORDER — ASPIRIN 81 MG PO CHEW: 81.0000 mg | CHEWABLE_TABLET | Freq: Every day | ORAL | 0 refills | Status: DC

## 2018-02-08 MED ORDER — FUROSEMIDE 20 MG PO TABS: 20.0000 mg | ORAL_TABLET | Freq: Every day | ORAL | 0 refills | Status: DC

## 2018-02-08 MED ORDER — CARVEDILOL 3.125 MG PO TABS: 3.1250 mg | ORAL_TABLET | Freq: Two times a day (BID) | ORAL | Status: DC

## 2018-02-08 MED ORDER — GUAIFENESIN ER 600 MG PO TB12: 600.0000 mg | ORAL_TABLET | Freq: Two times a day (BID) | ORAL | 2 refills | Status: DC

## 2018-02-08 MED ORDER — BISOPROLOL FUMARATE 5 MG PO TABS: 5.0000 mg | ORAL_TABLET | Freq: Every day | ORAL | 0 refills | Status: DC

## 2018-02-08 MED ORDER — BUDESONIDE 0.5 MG/2ML IN SUSP
0.5000 mg | Freq: Two times a day (BID) | RESPIRATORY_TRACT | 12 refills | Status: DC
Start: 2018-02-08 — End: 2019-06-10

## 2018-02-08 MED ORDER — PREDNISONE 10 MG PO TABS: ORAL_TABLET | ORAL | 0 refills | Status: DC

## 2018-02-08 MED ORDER — POTASSIUM CHLORIDE ER 10 MEQ PO TBCR: 10.0000 meq | EXTENDED_RELEASE_TABLET | Freq: Every day | ORAL | 1 refills | Status: DC

## 2018-02-08 NOTE — Care Management Note (Signed)
Case Management Note  Patient Details  Name: Timothy [name] MRN: [id number] Date of Birth: 1946-03-12  Subjective/Objective:       Pt presented for COPD exacerbation.  Pt has used [facility] through his insurance company in the past and has DME O2 equipment provided by [employer].    Action/Plan: Pt chose [facility].  Tommi Rumps with [facility] contacted and accepted referral.    Expected Discharge Date:  02/08/18               Expected Discharge Plan:  [place]  In-House Referral:  NA  Discharge planning Services  CM Consult  Post Acute Care Choice:  Home Health Choice offered to:  Patient  DME Arranged:  N/A DME Agency:  NA  HH Arranged:  RN [place] Agency:  [place]  Status of Service:  Completed, signed off  If discussed at [place] of Stay Meetings, dates discussed:    Additional Comments:  Claudie Leach, RN 02/08/2018, 4:41 PM

## 2018-02-08 NOTE — Discharge Summary (Signed)
Physician Discharge Summary  Timothy [name] JJO:[id number] DOB: 07-06-1946 DOA: 02/05/2018  PCP: Timothy [name]  Admit date: 02/05/2018 Discharge date: 02/08/2018  Admitted From: Home Disposition: Home  Recommendations for Outpatient Follow-up:  1. Follow up with PCP in 1-2 weeks 2. Please obtain BMP/CBC in one week 3. Patient will be set up with outpatient cardiology  Home Health: Home health RN Equipment/Devices:  Discharge Condition: Stable CODE STATUS: Full code Diet recommendation: Heart Healthy   Brief/Interim Summary: 72 year old male with a history of COPD and chronic intermittent oxygen use, presented to the hospital with shortness of breath with dyspnea on exertion.  He was found to have a COPD exacerbation, started on steroids, nebulizers and positive pressure therapy.  He was also felt to have a possible component of congestive heart failure and was given a dose of Lasix.  Echocardiogram was ordered that showed ejection fraction of 40% with apical wall akinesis.  Patient began to improve from respiratory standpoint and oxygen was weaned to room air.  He is feeling better at this time and feels that his respiratory status is back to baseline.  Steroids have been transitioned to prednisone taper.  He is completed a course of antibiotics and we will can be continued on bronchodilators at home.  Regarding congestive heart failure, he did not have any evidence of ACS with chest pain or elevated cardiac markers.  His EKG did show left bundle branch block, but when compared to EKG from 11/2017 there was noted to be an incomplete left bundle at that time as well.  Case was reviewed with Dr. Aundra Dubin on-call for cardiology who felt it would be appropriate for patient to follow-up as an outpatient to pursue further cardiac workup.  He is continued on aspirin, he was started on low-dose bisoprolol as well as low-dose Lasix.  He reports having a cardiac cath done in 2011 in the [place] area  which was reportedly normal.  Unfortunately, I do not have access to this report in care everywhere.  Due to his echocardiogram findings, he will likely need repeat cardiac catheterization after outpatient cardiology evaluation.  Discharge Diagnoses:  Principal Problem:   Acute respiratory failure ([place]) Active Problems:   COPD with acute exacerbation (HCC)   Hypertension   Abnormal EKG   Hyperphosphatemia   Skin lesions   Hyperglycemia   Chronic diastolic heart failure [facility])    Discharge Instructions  Discharge Instructions    Diet - low sodium heart healthy   Complete by:  As directed    Increase activity slowly   Complete by:  As directed      Allergies as of 02/08/2018      Reactions   Codeine Itching      Medication List    TAKE these medications   albuterol (2.5 MG/3ML) 0.083% nebulizer solution Commonly known as:  PROVENTIL Take 2.5 mg by nebulization every 6 (six) hours as needed for wheezing or shortness of breath.   arformoterol 15 MCG/2ML Nebu Commonly known as:  BROVANA Take 2 mLs (15 mcg total) by nebulization 2 (two) times daily. What changed:    when to take this  reasons to take this   aspirin 81 MG chewable tablet Chew 1 tablet (81 mg total) by mouth daily. Start taking on:  02/09/2018   bisoprolol 5 MG tablet Commonly known as:  ZEBETA Take 1 tablet (5 mg total) by mouth daily. Start taking on:  02/09/2018   budesonide 0.5 MG/2ML nebulizer solution Commonly known as:  PULMICORT Take 2 mLs (0.5 mg total) by nebulization 2 (two) times daily. What changed:    when to take this  reasons to take this   furosemide 20 MG tablet Commonly known as:  LASIX Take 1 tablet (20 mg total) by mouth daily.   guaiFENesin 600 MG 12 hr tablet Commonly known as:  MUCINEX Take 1 tablet (600 mg total) by mouth 2 (two) times daily.   ibuprofen 200 MG tablet Commonly known as:  ADVIL,MOTRIN Take 600 mg by mouth daily as needed for headache or moderate  pain.   potassium chloride 10 MEQ tablet Commonly known as:  K-DUR Take 1 tablet (10 mEq total) by mouth daily.   predniSONE 10 MG tablet Commonly known as:  DELTASONE Take 40mg  po daily for 2 days then 30mg  daily for 2 days then 20mg  daily for 2 days then 10mg  daily for 2 days then stop   tiotropium 18 MCG inhalation capsule Commonly known as:  SPIRIVA HANDIHALER Place 1 capsule (18 mcg total) into inhaler and inhale daily.      Follow-up Information    Care, [facility] Follow up.   Specialty:  Home Health Services Why:  RN will contact you to arrange visit.  Contact information: [place] [address] [phone]          Allergies  Allergen Reactions  . Codeine Itching    Consultations:     Procedures/Studies: Dg Chest Portable 1 View  Result Date: 02/05/2018 CLINICAL DATA:  Shortness of breath for 1 hour EXAM: PORTABLE CHEST 1 VIEW COMPARISON:  CT 10/30/2017 FINDINGS: Hyperinflation with emphysematous disease. Coarse bibasilar interstitial opacity. No focal airspace disease or pleural effusion. Normal heart size. No pneumothorax. IMPRESSION: 1. Hyperinflation with emphysematous disease 2. Coarse interstitial opacity at the bases, likely chronic change, although difficult to exclude mild acute superimposed edema or interstitial inflammation without priors. Electronically Signed   By: Donavan Foil M.D.   On: 02/05/2018 19:54    Echo:- Left ventricle: The cavity size was normal. Wall thickness was   normal. Systolic function was mildly to moderately reduced. The   estimated ejection fraction was = 40%. Doppler parameters are   consistent with abnormal left ventricular relaxation (grade 1   diastolic dysfunction). - Regional wall motion abnormality: Akinesis of the apical   myocardium. - Aortic valve: Mildly calcified annulus. Trileaflet; mildly   thickened leaflets. Valve area (VTI): 2.95 cm^2. Valve area   (Vmax): 2.67  cm^2. - Left atrium: The atrium was mildly dilated. - Pulmonary arteries: PA peak pressure: 31 mm Hg (S). PASP is   borderline elevated. - Technically adequate study.   Subjective: Feeling better.  No wheezing or cough.  Feels that breathing is back to baseline  Discharge Exam: Vitals:   02/08/18 1303 02/08/18 1357  BP: (!) 150/68   Pulse: 84   Resp: (!) 22   Temp: 98.1 F (36.7 C)   SpO2: 91% 98%   Vitals:   02/08/18 0427 02/08/18 0805 02/08/18 1303 02/08/18 1357  BP: 139/75  (!) 150/68   Pulse: 76  84   Resp: 20  (!) 22   Temp: 97.9 F (36.6 C)  98.1 F (36.7 C)   TempSrc: Oral  Oral   SpO2: 93% 99% 91% 98%  Weight:      Height:        General: Pt is alert, awake, not in acute distress Cardiovascular: RRR, S1/S2 +, no rubs, no gallops Respiratory: CTA  bilaterally, no wheezing, no rhonchi Abdominal: Soft, NT, ND, bowel sounds + Extremities: no edema, no cyanosis    The results of significant diagnostics from this hospitalization (including imaging, microbiology, ancillary and laboratory) are listed below for reference.     Microbiology: Recent Results (from the past 240 hour(s))  MRSA PCR Screening     Status: None   Collection Time: 02/06/18 12:08 AM  Result Value Ref Range Status   MRSA by PCR NEGATIVE NEGATIVE Final    Comment:        The GeneXpert MRSA Assay (FDA approved for NASAL specimens only), is one component of a comprehensive MRSA colonization surveillance program. It is not intended to diagnose MRSA infection nor to guide or monitor treatment for MRSA infections. Performed at [facility], [address] [place], [place] [address]      Labs: BNP (last 3 results) Recent Labs    02/05/18 1940  BNP 017.5*   Basic Metabolic Panel: Recent Labs  Lab 02/05/18 1940 02/06/18 0357  NA 139 139  K 3.8 4.5  CL 103 107  CO2 23 22  GLUCOSE 187* 171*  BUN 16 15  CREATININE 1.15 0.86  CALCIUM 9.0 8.3*  MG 2.2  --   PHOS 6.0* 2.8    Liver Function Tests: Recent Labs  Lab 02/05/18 1940  AST 32  ALT 20  ALKPHOS 53  BILITOT 0.7  PROT 7.3  ALBUMIN 3.6   No results for input(s): LIPASE, AMYLASE in the last 168 hours. No results for input(s): AMMONIA in the last 168 hours. CBC: Recent Labs  Lab 02/05/18 1940 02/06/18 0357 02/07/18 0609 02/08/18 0632  WBC 10.5 6.1 16.3* 12.2*  NEUTROABS 5.0 5.5 14.1* 10.7*  HGB 14.4 13.2 12.9* 13.5  HCT 46.0 42.4 40.3 43.2  MCV 95.2 94.4 93.7 94.3  PLT 228 203 218 245   Cardiac Enzymes: No results for input(s): CKTOTAL, CKMB, CKMBINDEX, TROPONINI in the last 168 hours. BNP: Invalid input(s): POCBNP CBG: Recent Labs  Lab 02/07/18 0739 02/07/18 1113 02/07/18 1616 02/08/18 0734 02/08/18 1114  GLUCAP 119* 88 134* 120* 129*   D-Dimer No results for input(s): DDIMER in the last 72 hours. Hgb A1c Recent Labs    02/06/18 0357  HGBA1C 5.0   Lipid Profile No results for input(s): CHOL, HDL, LDLCALC, TRIG, CHOLHDL, LDLDIRECT in the last 72 hours. Thyroid function studies No results for input(s): TSH, T4TOTAL, T3FREE, THYROIDAB in the last 72 hours.  Invalid input(s): FREET3 Anemia work up No results for input(s): VITAMINB12, FOLATE, FERRITIN, TIBC, IRON, RETICCTPCT in the last 72 hours. Urinalysis No results found for: COLORURINE, APPEARANCEUR, [place], [place], [place], [place], [place], [place], PROTEINUR, UROBILINOGEN, NITRITE, LEUKOCYTESUR Sepsis Labs Invalid input(s): PROCALCITONIN,  WBC,  LACTICIDVEN Microbiology Recent Results (from the past 240 hour(s))  MRSA PCR Screening     Status: None   Collection Time: 02/06/18 12:08 AM  Result Value Ref Range Status   MRSA by PCR NEGATIVE NEGATIVE Final    Comment:        The GeneXpert MRSA Assay (FDA approved for NASAL specimens only), is one component of a comprehensive MRSA colonization surveillance program. It is not intended to diagnose MRSA infection nor to guide or monitor treatment  for MRSA infections. Performed at [facility], [address], [place], [place] [address]      Time coordinating discharge: 35mins  SIGNED:   Kathie Dike, MD  [facility] 02/08/2018, 6:33 PM Pager   If 7PM-7AM, please contact night-coverage [email or web address] Password  [email or web address]

## 2018-02-08 NOTE — Progress Notes (Signed)
Patient's IV removed, site intact. Patient discharged with personal belongings and prescriptions.

## 2018-02-09 NOTE — Care Management (Signed)
CM received msg from [facility] rep that pt does not have PCP and can not be seen. Pt has Dr. Guillermina City listed on insurance card. CM called pt who says he has not seen that MD for a year (since he moved from [place]). He has contacted MD's in [place] who are not accepting pt's. CM e-mailed pt list of PCP's accepting new pt's in [place]. He is aware it is his responsibility to contact offices to establish care. Pt plans to do this. He not concerned about [facility] services stating "I can take care of myself".

## 2018-02-10 NOTE — H&P (Signed)
Urology Preoperative H&P   Chief Complaint: bladder cancer  History of Present Illness: Timothy [name] is a 72 y.o. male s/p TURBT on 12/17/17.   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  After a long discussion about the management of MIBC, the patient is adamantly against extirpative management.  He is here today for repeat TURBT with mitomycin injection.    Past Medical History:  Diagnosis Date  . COPD (chronic obstructive pulmonary disease) ([place])   . History of asbestosis 2010  . Hypertension    2010-prescribed Lisinopril, non-complinant with medication    Past Surgical History:  Procedure Laterality Date  . CARDIAC CATHETERIZATION     x 2  . CHOLECYSTECTOMY     2015  . CYSTOSCOPY W/ RETROGRADES Bilateral 12/17/2017   Procedure: CYSTOSCOPY WITH RETROGRADE PYELOGRAM;  Surgeon: Ceasar Mons, MD;  Location: [facility]S;  Service: Urology;  Laterality: Bilateral;  . HEMORRHOID SURGERY    . HERNIA REPAIR  2008   Abdominal   . TRANSURETHRAL RESECTION OF BLADDER TUMOR N/A 12/17/2017   Procedure: TRANSURETHRAL RESECTION OF BLADDER TUMOR (TURBT);  Surgeon: Ceasar Mons, MD;  Location: [facility]S;  Service: Urology;  Laterality: N/A;  . VASECTOMY  1998    Allergies:  Allergies  Allergen Reactions  . Codeine Itching    Family History  Problem Relation Age of Onset  . Lung disease Mother        Asbestosis.  . Pancreatic cancer Father     Social History:  reports that he quit smoking about 15 years ago. His smoking use included cigarettes. He has a 40.00 pack-year smoking history. He has never used smokeless tobacco. He reports that he drinks about 0.6 oz of alcohol per week. He reports that he does not use drugs.  ROS: A complete review of systems was performed.  All systems are negative except for pertinent findings as noted.  Physical Exam:  Vital signs in last 24 hours:   Constitutional:  Alert and oriented, No  acute distress Cardiovascular: Regular rate and rhythm, No JVD Respiratory: Normal respiratory effort, Lungs clear bilaterally GI: Abdomen is soft, nontender, nondistended, no abdominal masses GU: No CVA tenderness Lymphatic: No lymphadenopathy Neurologic: Grossly intact, no focal deficits Psychiatric: Normal mood and affect  Laboratory Data:  Recent Labs    02/08/18 0632  WBC 12.2*  HGB 13.5  HCT 43.2  PLT 245    No results for input(s): NA, K, CL, GLUCOSE, BUN, CALCIUM, CREATININE in the last 72 hours.  Invalid input(s): CO3   No results found for this or any previous visit (from the past 24 hour(s)). Recent Results (from the past 240 hour(s))  MRSA PCR Screening     Status: None   Collection Time: 02/06/18 12:08 AM  Result Value Ref Range Status   MRSA by PCR NEGATIVE NEGATIVE Final    Comment:        The GeneXpert MRSA Assay (FDA approved for NASAL specimens only), is one component of a comprehensive MRSA colonization surveillance program. It is not intended to diagnose MRSA infection nor to guide or monitor treatment for MRSA infections. Performed at [facility], [address], [place], [place] [address]     Renal Function: Recent Labs    02/05/18 1940 02/06/18 0357  CREATININE 1.15 0.86   Estimated Creatinine Clearance: 76.2 mL/min (by C-G formula based on SCr of 0.86 mg/dL).  Radiologic Imaging: No results found.  I independently reviewed the above imaging studies.  Assessment and Plan Timothy [name] is a 72 y.o. 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  He is adamantly opposed to a cystectomy.   The risks, benefits and alternatives of cystoscopy with TURBT was discussed with the patient.  The risks included, but are not limited to, bleeding,  urinary tract infection, bladder perforation requiring prolonged catheterization and/or open bladder repair, ureteral obstruction, voiding dysfunction and the inherent risks of general anesthesia.  The  patient voices understanding and wishes to proceed.   Ellison Hughs, MD 02/10/2018, 1:17 PM  [facility] Pager: [phone]

## 2018-02-11 ENCOUNTER — Encounter (HOSPITAL_COMMUNITY)
Admission: RE | Disposition: A | Discharge status: Home / Self Care | Payer: Self-pay | Source: Ambulatory Visit | Attending: Urology

## 2018-02-11 ENCOUNTER — Ambulatory Visit (HOSPITAL_COMMUNITY): Payer: Medicare Other | Admitting: Anesthesiology

## 2018-02-11 ENCOUNTER — Encounter (HOSPITAL_COMMUNITY): Payer: Self-pay | Admitting: *Deleted

## 2018-02-11 ENCOUNTER — Ambulatory Visit (HOSPITAL_COMMUNITY)
Admission: RE | Admit: 2018-02-11 | Discharge: 2018-02-11 | Disposition: A | Discharge status: Home / Self Care | Payer: Medicare Other | Source: Ambulatory Visit | Attending: Urology | Admitting: Urology

## 2018-02-11 DIAGNOSIS — C674 Malignant neoplasm of posterior wall of bladder: Secondary | ICD-10-CM | POA: Insufficient documentation

## 2018-02-11 DIAGNOSIS — Z9049 Acquired absence of other specified parts of digestive tract: Secondary | ICD-10-CM | POA: Insufficient documentation

## 2018-02-11 DIAGNOSIS — Z87898 Personal history of other specified conditions: Secondary | ICD-10-CM | POA: Diagnosis not present

## 2018-02-11 DIAGNOSIS — Z885 Allergy status to narcotic agent status: Secondary | ICD-10-CM | POA: Diagnosis not present

## 2018-02-11 DIAGNOSIS — Z87891 Personal history of nicotine dependence: Secondary | ICD-10-CM | POA: Insufficient documentation

## 2018-02-11 DIAGNOSIS — C679 Malignant neoplasm of bladder, unspecified: Secondary | ICD-10-CM | POA: Diagnosis present

## 2018-02-11 DIAGNOSIS — Z8551 Personal history of malignant neoplasm of bladder: Secondary | ICD-10-CM | POA: Diagnosis not present

## 2018-02-11 DIAGNOSIS — J449 Chronic obstructive pulmonary disease, unspecified: Secondary | ICD-10-CM | POA: Insufficient documentation

## 2018-02-11 DIAGNOSIS — I10 Essential (primary) hypertension: Secondary | ICD-10-CM | POA: Diagnosis not present

## 2018-02-11 DIAGNOSIS — Z800 Family history of malignant neoplasm of digestive organs: Secondary | ICD-10-CM | POA: Diagnosis not present

## 2018-02-11 DIAGNOSIS — Z836 Family history of other diseases of the respiratory system: Secondary | ICD-10-CM | POA: Diagnosis not present

## 2018-02-11 HISTORY — PX: TRANSURETHRAL RESECTION OF BLADDER TUMOR: SHX2575

## 2018-02-11 SURGERY — TURBT (TRANSURETHRAL RESECTION OF BLADDER TUMOR)
Anesthesia: General

## 2018-02-11 MED ORDER — MITOMYCIN CHEMO FOR BLADDER INSTILLATION 40 MG
INTRAVENOUS | Status: DC | PRN
  Administered 2018-02-11: 40 mg via INTRAVESICAL

## 2018-02-11 MED ORDER — SODIUM CHLORIDE 0.9 % IR SOLN
Status: DC | PRN
  Administered 2018-02-11: 6000 mL

## 2018-02-11 MED ORDER — FENTANYL CITRATE (PF) 100 MCG/2ML IJ SOLN
INTRAMUSCULAR | Status: AC
  Filled 2018-02-11: qty 2

## 2018-02-11 MED ORDER — DEXAMETHASONE SODIUM PHOSPHATE 10 MG/ML IJ SOLN
INTRAMUSCULAR | Status: DC | PRN
  Administered 2018-02-11: 5 mg via INTRAVENOUS

## 2018-02-11 MED ORDER — CEPHALEXIN 500 MG PO CAPS: 500.0000 mg | ORAL_CAPSULE | Freq: Three times a day (TID) | ORAL | 0 refills | Status: AC

## 2018-02-11 MED ORDER — PHENAZOPYRIDINE HCL 200 MG PO TABS: 200.0000 mg | ORAL_TABLET | Freq: Three times a day (TID) | ORAL | 0 refills | Status: DC | PRN

## 2018-02-11 MED ORDER — ONDANSETRON HCL 4 MG/2ML IJ SOLN
INTRAMUSCULAR | Status: DC | PRN
  Administered 2018-02-11: 4 mg via INTRAVENOUS

## 2018-02-11 MED ORDER — FENTANYL CITRATE (PF) 100 MCG/2ML IJ SOLN
INTRAMUSCULAR | Status: DC | PRN
  Administered 2018-02-11 (×3): 50 ug via INTRAVENOUS

## 2018-02-11 MED ORDER — PROPOFOL 10 MG/ML IV BOLUS
INTRAVENOUS | Status: AC
  Filled 2018-02-11: qty 20

## 2018-02-11 MED ORDER — FENTANYL CITRATE (PF) 100 MCG/2ML IJ SOLN: 25.0000 ug | INTRAMUSCULAR | Status: DC | PRN

## 2018-02-11 MED ORDER — MITOMYCIN CHEMO FOR BLADDER INSTILLATION 40 MG
40.0000 mg | Freq: Once | INTRAVENOUS | Status: DC
  Filled 2018-02-11: qty 40

## 2018-02-11 MED ORDER — DEXAMETHASONE SODIUM PHOSPHATE 10 MG/ML IJ SOLN
INTRAMUSCULAR | Status: AC
  Filled 2018-02-11: qty 1

## 2018-02-11 MED ORDER — LIDOCAINE 2% (20 MG/ML) 5 ML SYRINGE
INTRAMUSCULAR | Status: DC | PRN
  Administered 2018-02-11: 100 mg via INTRAVENOUS

## 2018-02-11 MED ORDER — ONDANSETRON HCL 4 MG/2ML IJ SOLN
INTRAMUSCULAR | Status: AC
  Filled 2018-02-11: qty 2

## 2018-02-11 MED ORDER — PROMETHAZINE HCL 25 MG/ML IJ SOLN: 6.2500 mg | INTRAMUSCULAR | Status: DC | PRN

## 2018-02-11 MED ORDER — SCOPOLAMINE 1 MG/3DAYS TD PT72
1.0000 | MEDICATED_PATCH | TRANSDERMAL | Status: DC
  Administered 2018-02-11: 1.5 mg via TRANSDERMAL
  Filled 2018-02-11: qty 1

## 2018-02-11 MED ORDER — CEFAZOLIN SODIUM-DEXTROSE 2-4 GM/100ML-% IV SOLN
2.0000 g | Freq: Once | INTRAVENOUS | Status: AC
  Administered 2018-02-11: 2 g via INTRAVENOUS
  Filled 2018-02-11: qty 100

## 2018-02-11 MED ORDER — LIDOCAINE 2% (20 MG/ML) 5 ML SYRINGE
INTRAMUSCULAR | Status: AC
  Filled 2018-02-11: qty 5

## 2018-02-11 MED ORDER — HYDROCODONE-ACETAMINOPHEN 5-325 MG PO TABS: 1.0000 | ORAL_TABLET | ORAL | 0 refills | Status: DC | PRN

## 2018-02-11 MED ORDER — PROPOFOL 10 MG/ML IV BOLUS
INTRAVENOUS | Status: DC | PRN
  Administered 2018-02-11: 150 mg via INTRAVENOUS
  Administered 2018-02-11: 50 mg via INTRAVENOUS

## 2018-02-11 MED ORDER — LACTATED RINGERS IV SOLN
INTRAVENOUS | Status: DC
Start: 2018-02-11 — End: 2018-02-11
  Administered 2018-02-11: 10:00:00 via INTRAVENOUS

## 2018-02-11 MED ORDER — EPHEDRINE 5 MG/ML INJ
INTRAVENOUS | Status: AC
  Filled 2018-02-11: qty 10

## 2018-02-11 MED ORDER — EPHEDRINE SULFATE-NACL 50-0.9 MG/10ML-% IV SOSY
PREFILLED_SYRINGE | INTRAVENOUS | Status: DC | PRN
  Administered 2018-02-11 (×2): 5 mg via INTRAVENOUS

## 2018-02-11 SURGICAL SUPPLY — 18 items
BAG URINE DRAINAGE (UROLOGICAL SUPPLIES) ×3 IMPLANT
BAG URO CATCHER STRL LF (MISCELLANEOUS) ×3 IMPLANT
CATH FOLEY 2WAY SLVR  5CC 16FR (CATHETERS) ×2
CATH FOLEY 2WAY SLVR 5CC 16FR (CATHETERS) ×1 IMPLANT
COVER FOOTSWITCH UNIV (MISCELLANEOUS) ×3 IMPLANT
ELECT REM PT RETURN 15FT ADLT (MISCELLANEOUS) ×3 IMPLANT
GLOVE BIOGEL M STRL SZ7.5 (GLOVE) ×3 IMPLANT
GOWN STRL REUS W/TWL LRG LVL3 (GOWN DISPOSABLE) ×3 IMPLANT
GOWN STRL REUS W/TWL XL LVL3 (GOWN DISPOSABLE) ×3 IMPLANT
LOOP CUT BIPOLAR 24F LRG (ELECTROSURGICAL) ×3 IMPLANT
MANIFOLD NEPTUNE II (INSTRUMENTS) ×3 IMPLANT
PACK CYSTO (CUSTOM PROCEDURE TRAY) ×3 IMPLANT
PLUG CATH AND CAP STER (CATHETERS) ×3 IMPLANT
SET ASPIRATION TUBING (TUBING) IMPLANT
SYRINGE IRR TOOMEY STRL 70CC (SYRINGE) IMPLANT
TUBING CONNECTING 10 (TUBING) ×2 IMPLANT
TUBING CONNECTING 10' (TUBING) ×1
TUBING UROLOGY SET (TUBING) ×3 IMPLANT

## 2018-02-11 NOTE — Anesthesia Preprocedure Evaluation (Signed)
Anesthesia Evaluation  Patient identified by MRN, date of birth, ID band Patient awake    Reviewed: Allergy & Precautions, NPO status , Patient's Chart, lab work & pertinent test results  History of Anesthesia Complications Negative for: history of anesthetic complications  Airway Mallampati: II  TM Distance: >3 FB Neck ROM: Full    Dental  (+) Missing,    Pulmonary COPD, former smoker,     + decreased breath sounds      Cardiovascular hypertension, negative cardio ROS   Rhythm:Regular Rate:Normal     Neuro/Psych negative neurological ROS  negative psych ROS   GI/Hepatic negative GI ROS, Neg liver ROS,   Endo/Other  negative endocrine ROS  Renal/GU negative Renal ROS  negative genitourinary   Musculoskeletal negative musculoskeletal ROS (+)   Abdominal Normal abdominal exam  (+)   Peds  Hematology negative hematology ROS (+)   Anesthesia Other Findings   Reproductive/Obstetrics                             Lab Results  Component Value Date   WBC 12.2 (H) 02/08/2018   HGB 13.5 02/08/2018   HCT 43.2 02/08/2018   MCV 94.3 02/08/2018   PLT 245 02/08/2018   Lab Results  Component Value Date   CREATININE 0.86 02/06/2018   BUN 15 02/06/2018   NA 139 02/06/2018   K 4.5 02/06/2018   CL 107 02/06/2018   CO2 22 02/06/2018   No results found for: INR, PROTIME  EKG: normal sinus rhythm, PAC's noted.   Anesthesia Physical  Anesthesia Plan  ASA: III  Anesthesia Plan: General   Post-op Pain Management:    Induction: Intravenous  PONV Risk Score and Plan: 3 and Treatment may vary due to age or medical condition, Ondansetron, Dexamethasone and Scopolamine patch - Pre-op  Airway Management Planned: LMA  Additional Equipment:   Intra-op Plan:   Post-operative Plan: Extubation in OR  Informed Consent: I have reviewed the patients History and Physical, chart, labs and  discussed the procedure including the risks, benefits and alternatives for the proposed anesthesia with the patient or authorized representative who has indicated his/her understanding and acceptance.   Dental advisory given  Plan Discussed with: Anesthesiologist and CRNA  Anesthesia Plan Comments:         Anesthesia Quick Evaluation

## 2018-02-11 NOTE — Transfer of Care (Signed)
Immediate Anesthesia Transfer of Care Note  Patient: Timothy [name]  Procedure(s) Performed: TRANSURETHRAL RESECTION OF BLADDER TUMOR (TURBT) WITH INSTILLATION OF MITOMYCIN (N/A )  Patient Location: PACU  Anesthesia Type:General  Level of Consciousness: awake, alert  and patient cooperative  Airway & Oxygen Therapy: Patient Spontanous Breathing and Patient connected to face mask oxygen  Post-op Assessment: Report given to RN and Post -op Vital signs reviewed and stable  Post vital signs: Reviewed and stable  Last Vitals:  Vitals Value Taken Time  BP 135/79 02/11/2018 11:36 AM  Temp    Pulse 71 02/11/2018 11:38 AM  Resp 11 02/11/2018 11:38 AM  SpO2 100 % 02/11/2018 11:38 AM  Vitals shown include unvalidated device data.  Last Pain:  Vitals:   02/11/18 0931  TempSrc:   PainSc: 0-No pain         Complications: No apparent anesthesia complications

## 2018-02-11 NOTE — Op Note (Signed)
Operative Note  Preoperative diagnosis:  1.  Muscle invasive bladder cancer  Postoperative diagnosis: 1.  Muscle invasive bladder cancer 2.  1 cm posterior bladder wall tumor recurrence  Procedure(s): 1.  Cystoscopy with TURBT  2.  Intravesical installation of mitomycin-C  Surgeon: Ellison Hughs, MD  Assistants:  None   Anesthesia:  Gen LMA  Complications: None   EBL: Less than 5 mL  Specimens: 1.  Posterior bladder wall tumor 16 French Foley catheter  Drains/Catheters: 1.  16 French Foley catheter  Intraoperative findings:   1. 1 cm posterior bladder wall wall tumor recurrence  Indication:  Timothy [name] is a 72 y.o. male with s/p TURBT on 12/17/17.   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  After a long discussion about the management of MIBC, the patient is adamantly against extirpative management.  He is here today for repeat TURBT with mitomycin injection.  Description of procedure:  After informed consent was obtained, the patient was brought to the operating room and general LMA anesthesia was administered. The patient was then placed in the dorsolithotomy position and prepped and draped in usual sterile fashion. A timeout was performed. A 23 French rigid cystoscope was then inserted into the urethral meatus and advanced into the bladder under direct vision. A complete bladder survey revealed a 1 cm bladder tumor recurrence involving the posterior bladder wall at his previous resection site .  There were no other intravesical bladder tumors or lesions.     The  rigid cystoscope was then exchanged for a 26 French resectoscope with a bipolar loop working element.  The bladder tumor involving the posterior bladder wall was then systematically resected down to the detrusor musculature.  There is no evidence of bladder perforation during the procedure.  The tumor specimen was then retrieved through the sheath of the  resectoscope and sent to pathology for permanent section.  The area of resection was then extensively fulgurated until hemostasis was achieved.  The resectoscope was then removed under direct vision and replaced with a 16 French Foley catheter, completely draining his bladder.  A 40 mg dose of mitomycin-C was then instilled through the catheter and into the bladder, which was then immediately plugged 11:22 AM.  The patient tolerated the procedure well and was transferred to the postanesthesia unit in stable condition.  Plan: The patient will have his catheter removed at 12:22 PM in the PACU.  Follow-up in 6 weeks to start intravesical therapy

## 2018-02-11 NOTE — Anesthesia Procedure Notes (Signed)
Procedure Name: LMA Insertion Date/Time: 02/11/2018 10:57 AM Performed by: Lollie Sails, CRNA Pre-anesthesia Checklist: Patient identified, Emergency Drugs available, Suction available, Patient being monitored and Timeout performed Patient Re-evaluated:Patient Re-evaluated prior to induction Oxygen Delivery Method: Circle system utilized Preoxygenation: Pre-oxygenation with 100% oxygen Induction Type: IV induction Ventilation: Mask ventilation without difficulty LMA: LMA inserted LMA Size: 5.0 Number of attempts: 1 Placement Confirmation: positive ETCO2 and breath sounds checked- equal and bilateral Tube secured with: Tape Dental Injury: Teeth and Oropharynx as per pre-operative assessment

## 2018-02-11 NOTE — Anesthesia Postprocedure Evaluation (Signed)
Anesthesia Post Note  Patient: Timothy [name]  Procedure(s) Performed: TRANSURETHRAL RESECTION OF BLADDER TUMOR (TURBT) WITH INSTILLATION OF MITOMYCIN (N/A )     Patient location during evaluation: PACU Anesthesia Type: General Level of consciousness: sedated Pain management: pain level controlled Vital Signs Assessment: post-procedure vital signs reviewed and stable Respiratory status: spontaneous breathing and respiratory function stable Cardiovascular status: stable Postop Assessment: no apparent nausea or vomiting Anesthetic complications: no    Last Vitals:  Vitals:   02/11/18 1230 02/11/18 1240  BP: 127/73 (!) 141/76  Pulse: 64 62  Resp: 14 12  Temp: 37.1 C 36.5 C  SpO2: 94% 94%    Last Pain:  Vitals:   02/11/18 1240  TempSrc: Oral  PainSc: 0-No pain                 Allen Basista DANIEL

## 2018-02-11 NOTE — Interval H&P Note (Signed)
History and Physical Interval Note:  02/11/2018 9:34 AM  Lendon Ka Randon  has presented today for surgery, with the diagnosis of BLADDER CANCER  The various methods of treatment have been discussed with the patient and family. After consideration of risks, benefits and other options for treatment, the patient has consented to  Procedure(s) with comments: TRANSURETHRAL RESECTION OF BLADDER TUMOR (TURBT) WITH INSTILLATION OF MITOMYCIN (N/A) - ONLY NEEDS 30 MIN as a surgical intervention .  The patient's history has been reviewed, patient examined, no change in status, stable for surgery.  I have reviewed the patient's chart and labs.  Questions were answered to the patient's satisfaction.     Timothy [name]

## 2018-02-11 NOTE — Discharge Instructions (Signed)
Transurethral Resection of Bladder Tumor, Care After °Refer to this sheet in the next few weeks. These instructions provide you with information about caring for yourself after your procedure. Your health care provider may also give you more specific instructions. Your treatment has been planned according to current medical practices, but problems sometimes occur. Call your health care provider if you have any problems or questions after your procedure. °What can I expect after the procedure? °After the procedure, it is common to have: °· A small amount of blood in your urine for up to 2 weeks. °· Soreness or mild discomfort from your catheter. After your catheter is removed, you may have mild soreness, especially when urinating. °· Pain in your lower abdomen. ° °Follow these instructions at home: ° °Medicines °· Take over-the-counter and prescription medicines only as told by your health care provider. °· Do not drive or operate heavy machinery while taking prescription pain medicine. °· Do not drive for 24 hours if you received a sedative. °· If you were prescribed antibiotic medicine, take it as told by your health care provider. Do not stop taking the antibiotic even if you start to feel better. °Activity °· Return to your normal activities as told by your health care provider. Ask your health care provider what activities are safe for you. °· Do not lift anything that is heavier than 10 lb (4.5 kg) for as long as told by your health care provider. °· Avoid intense physical activity for as long as told by your health care provider. °· Walk at least one time every day. This helps to prevent blood clots. You may increase your physical activity gradually as you start to feel better. °General instructions °· Do not drink alcohol for as long as told by your health care provider. This is especially important if you are taking prescription pain medicines. °· Do not take baths, swim, or use a hot tub until your health  care provider approves. °· If you have a catheter, follow instructions from your health care provider about caring for your catheter and your drainage bag. °· Drink enough fluid to keep your urine clear or pale yellow. °· Wear compression stockings as told by your health care provider. These stockings help to prevent blood clots and reduce swelling in your legs. °· Keep all follow-up visits as told by your health care provider. This is important. °Contact a health care provider if: °· You have pain that gets worse or does not improve with medicine. °· You have blood in your urine for more than 2 weeks. °· You have cloudy or bad-smelling urine. °· You become constipated. Signs of constipation may include having: °? Fewer than three bowel movements in a week. °? Difficulty having a bowel movement. °? Stools that are dry, hard, or larger than normal. °· You have a fever. °Get help right away if: °· You have: °? Severe pain. °? Bright-red blood in your urine. °? Blood clots in your urine. °? A lot of blood in your urine. °· Your catheter has been removed and you are not able to urinate. °· You have a catheter in place and the catheter is not draining urine. °This information is not intended to replace advice given to you by your health care provider. Make sure you discuss any questions you have with your health care provider. °Document Released: 09/25/2015 Document Revised: 06/16/2016 Document Reviewed: 07/06/2015 °Elsevier Interactive Patient Education © 2018 [employer] ° °General Anesthesia, Adult, Care After °These instructions   provide you with information about caring for yourself after your procedure. Your health care provider may also give you more specific instructions. Your treatment has been planned according to current medical practices, but problems sometimes occur. Call your health care provider if you have any problems or questions after your procedure. °What can I expect after the procedure? °After the  procedure, it is common to have: °· Vomiting. °· A sore throat. °· Mental slowness. ° °It is common to feel: °· Nauseous. °· Cold or shivery. °· Sleepy. °· Tired. °· Sore or achy, even in parts of your body where you did not have surgery. ° °Follow these instructions at home: °For at least 24 hours after the procedure: °· Do not: °? Participate in activities where you could fall or become injured. °? Drive. °? Use heavy machinery. °? Drink alcohol. °? Take sleeping pills or medicines that cause drowsiness. °? Make important decisions or sign legal documents. °? Take care of children on your own. °· Rest. °Eating and drinking °· If you vomit, drink water, juice, or soup when you can drink without vomiting. °· Drink enough fluid to keep your urine clear or pale yellow. °· Make sure you have little or no nausea before eating solid foods. °· Follow the diet recommended by your health care provider. °General instructions °· Have a responsible adult stay with you until you are awake and alert. °· Return to your normal activities as told by your health care provider. Ask your health care provider what activities are safe for you. °· Take over-the-counter and prescription medicines only as told by your health care provider. °· If you smoke, do not smoke without supervision. °· Keep all follow-up visits as told by your health care provider. This is important. °Contact a health care provider if: °· You continue to have nausea or vomiting at home, and medicines are not helpful. °· You cannot drink fluids or start eating again. °· You cannot urinate after 8-12 hours. °· You develop a skin rash. °· You have fever. °· You have increasing redness at the site of your procedure. °Get help right away if: °· You have difficulty breathing. °· You have chest pain. °· You have unexpected bleeding. °· You feel that you are having a life-threatening or urgent problem. °This information is not intended to replace advice given to you by your  health care provider. Make sure you discuss any questions you have with your health care provider. °Document Released: 01/20/2001 Document Revised: 03/18/2016 Document Reviewed: 09/28/2015 °Elsevier Interactive Patient Education © 2018 [employer] Inc[employer] ° °

## 2018-02-12 ENCOUNTER — Encounter (HOSPITAL_COMMUNITY): Payer: Self-pay | Admitting: Urology

## 2018-03-03 ENCOUNTER — Encounter: Payer: Self-pay | Admitting: Cardiology

## 2018-03-03 NOTE — Progress Notes (Signed)
Cardiology Office Note  Date: 03/04/2018   ID: Timothy [name], DOB February 07, 1946, MRN [id number]  PCP: Patient, [name]  Consulting Cardiologist: Timothy Lesches, MD   Chief Complaint  Patient presents with  . Cardiomyopathy    History of Present Illness: Timothy [name] is a 72 y.o. male referred for cardiology consultation by Dr. Roderic [name] for the assessment of cardiomyopathy.  At this point he does not have a regular PCP, moved here from [place] about a year ago.  He has a history of asbestosis and states that he follows with a pulmonologist in [place].  Also reportedly COPD with intermittent oxygen requirement.  He states that he had a cardiac catheterization, possibly sometime in 2012 that demonstrated normal coronary arteries.  Not entirely clear about why he underwent this test, he does not report any history of chest pain, perhaps he had documented cardiomyopathy previously.  His recent echocardiogram showed LVEF approximately 40% with reported apical akinesis.  He stopped the bisoprolol and Lasix that were started at his recent hospital stay.  States that he felt somewhat weak in the afternoon.  He does not report any palpitations or syncope.  Past Medical History:  Diagnosis Date  . COPD (chronic obstructive pulmonary disease) ([place])   . Essential hypertension   . History of asbestosis 2010  . History of cardiac catheterization 2012   Reportedly normal coronaries  . Respiratory failure with hypoxia (HCC)    Home oxygen    Past Surgical History:  Procedure Laterality Date  . CARDIAC CATHETERIZATION     x 2  . CHOLECYSTECTOMY     2015  . CYSTOSCOPY W/ RETROGRADES Bilateral 12/17/2017   Procedure: CYSTOSCOPY WITH RETROGRADE PYELOGRAM;  Surgeon: Timothy Mons, MD;  Location: [facility];  Service: Urology;  Laterality: Bilateral;  . HEMORRHOID SURGERY    . HERNIA REPAIR  2008   Abdominal   . TRANSURETHRAL RESECTION OF BLADDER TUMOR N/A 12/17/2017   Procedure:  TRANSURETHRAL RESECTION OF BLADDER TUMOR (TURBT);  Surgeon: Timothy Mons, MD;  Location: [facility];  Service: Urology;  Laterality: N/A;  . TRANSURETHRAL RESECTION OF BLADDER TUMOR N/A 02/11/2018   Procedure: TRANSURETHRAL RESECTION OF BLADDER TUMOR (TURBT) WITH INSTILLATION OF MITOMYCIN;  Surgeon: Timothy Mons, MD;  Location: [facility];  Service: Urology;  Laterality: N/A;  ONLY NEEDS 30 MIN  . VASECTOMY  1998    Current Outpatient Medications  Medication Sig Dispense Refill  . albuterol (PROVENTIL) (2.5 MG/3ML) 0.083% nebulizer solution Take 2.5 mg by nebulization every 6 (six) hours as needed for wheezing or shortness of breath.    Marland Kitchen arformoterol (BROVANA) 15 MCG/2ML NEBU Take 2 mLs (15 mcg total) by nebulization 2 (two) times daily. 120 mL   . aspirin 81 MG chewable tablet Chew 1 tablet (81 mg total) by mouth daily. 30 tablet 0  . budesonide (PULMICORT) 0.5 MG/2ML nebulizer solution Take 2 mLs (0.5 mg total) by nebulization 2 (two) times daily.  12  . bisoprolol (ZEBETA) 5 MG tablet Take 0.5 tablets (2.5 mg total) by mouth daily. 45 tablet 0   No current facility-administered medications for this visit.    Allergies:  Codeine   Social History: The patient  reports that he quit smoking about 15 years ago. His smoking use included cigarettes. He has a 40.00 pack-year smoking history. He has never used smokeless tobacco. He reports that he drinks about 0.6 oz of alcohol per week. He reports that he does not use drugs.  Family History: The patient's family history includes Lung disease in his mother; Pancreatic cancer in his father.   ROS:  Please see the history of present illness. Otherwise, complete review of systems is positive for NYHA class II-III dyspnea.  All other systems are reviewed and negative.   Physical Exam: VS:  BP 134/72   Pulse 84   Ht 5' 8.5" (1.74 m)   Wt 165 lb (74.8 kg)   SpO2 93%   BMI 24.72 kg/m , BMI Body mass index is 24.72 kg/m.  Wt  Readings from Last 3 Encounters:  03/04/18 165 lb (74.8 kg)  02/11/18 163 lb (73.9 kg)  02/06/18 163 lb 5.8 oz (74.1 kg)    General: Chronically ill-appearing male, no distress. HEENT: Conjunctiva and lids normal, oropharynx clear. Neck: Supple, no elevated JVP or carotid bruits, no thyromegaly. Lungs: Decreased breath sounds throughout without wheezing, nonlabored breathing at rest. Cardiac: Regular rate and rhythm, no S3, soft systolic murmur. Abdomen: Soft, nontender, bowel sounds present. Extremities: No pitting edema, distal pulses 2+. Skin: Warm and dry. Musculoskeletal: No kyphosis. Neuropsychiatric: Alert and oriented x3, affect grossly appropriate.  ECG: I personally reviewed the tracing from 02/05/2018 which showed probable sinus tachycardia with left bundle branch block.  Recent Labwork: 02/05/2018: ALT 20; AST 32; B Natriuretic Peptide 128.0; Magnesium 2.2 02/06/2018: BUN 15; Creatinine, Ser 0.86; Potassium 4.5; Sodium 139 02/08/2018: Hemoglobin 13.5; Platelets 245   Other Studies Reviewed Today:  Echocardiogram 02/06/2018: Study Conclusions  - Left ventricle: The cavity size was normal. Wall thickness was   normal. Systolic function was mildly to moderately reduced. The   estimated ejection fraction was = 40%. Doppler parameters are   consistent with abnormal left ventricular relaxation (grade 1   diastolic dysfunction). - Regional wall motion abnormality: Akinesis of the apical   myocardium. - Aortic valve: Mildly calcified annulus. Trileaflet; mildly   thickened leaflets. Valve area (VTI): 2.95 cm^2. Valve area   (Vmax): 2.67 cm^2. - Left atrium: The atrium was mildly dilated. - Pulmonary arteries: PA peak pressure: 31 mm Hg (S). PASP is   borderline elevated. - Technically adequate study.  Assessment and Plan:  1.  Cardiomyopathy with LVEF approximately 40% and reported apical akinesis.  I do not have details about his previous cardiac work-up, but he is under  the impression that he had normal coronary arteries at cardiac catheterization in 2012, and was told that he did not have any history of heart attack.  Plan at this time is to have him resume bisoprolol although at 2.5 mg taken in the evening.  He does not report any leg swelling and we will hold off on diuretics for now.  Next aim to add ARB.  We will request records regarding his prior cardiac work-up and have him return for office follow-up.  2.  COPD and reported history of asbestosis.  He states that he follows with a pulmonologist in [place].  He has oxygen available to use as needed.  3.  Essential hypertension.  4.  Tobacco abuse in remission.  Current medicines were reviewed with the patient today.  Disposition: Follow-up in 6 weeks.  Signed, Satira Sark, MD, [facility] 03/04/2018 3:18 PM    [facility] [place], [place], [place] [address] Phone: [phone]; Fax: [phone]

## 2018-03-04 ENCOUNTER — Encounter: Payer: Self-pay | Admitting: Cardiology

## 2018-03-04 ENCOUNTER — Ambulatory Visit: Payer: Medicare Other | Admitting: Cardiology

## 2018-03-04 VITALS — BP 134/72 | HR 84 | Ht 68.5 in | Wt 165.0 lb

## 2018-03-04 DIAGNOSIS — F17201 Nicotine dependence, unspecified, in remission: Secondary | ICD-10-CM

## 2018-03-04 DIAGNOSIS — I429 Cardiomyopathy, unspecified: Secondary | ICD-10-CM | POA: Diagnosis not present

## 2018-03-04 DIAGNOSIS — J449 Chronic obstructive pulmonary disease, unspecified: Secondary | ICD-10-CM

## 2018-03-04 DIAGNOSIS — I10 Essential (primary) hypertension: Secondary | ICD-10-CM | POA: Diagnosis not present

## 2018-03-04 MED ORDER — BISOPROLOL FUMARATE 5 MG PO TABS: 2.5000 mg | ORAL_TABLET | Freq: Every day | ORAL | 0 refills | Status: DC

## 2018-03-04 NOTE — Patient Instructions (Signed)
Medication Instructions:  Your physician has recommended you make the following change in your medication:    START Bisoprolol 2.5 mg daily   Please continue all other medications that you have been prescribed   Labwork: NONE  Testing/Procedures: NONE  Follow-Up: Your physician recommends that you schedule a follow-up appointment in: [place]  Any Other Special Instructions Will Be Listed Below (If Applicable).  If you need a refill on your cardiac medications before your next appointment, please call your pharmacy.

## 2018-04-23 ENCOUNTER — Ambulatory Visit: Payer: Medicare Other | Admitting: Cardiology

## 2018-05-01 NOTE — Progress Notes (Signed)
Cardiology Office Note  Date: 05/04/2018   ID: Timothy [name], DOB April 19, 1946, MRN [id number]  PCP: Patient, [name]  Primary Cardiologist: Rozann Lesches, MD   Chief Complaint  Patient presents with  . Cardiomyopathy    History of Present Illness: Timothy [name] is a 72 y.o. male seen in consultation in May.  He presents for a follow-up visit.  He states that he has been tolerating bisoprolol at 2.5 mg daily, also continues on low-dose aspirin.  I did not receive any of his previous cardiac catheterization records as yet.  He does not report any exertional chest pain.  States that he has follow-up with his pulmonologist soon, history of COPD and asbestosis.  Today we discussed his cardiomyopathy and plan to further optimize medical therapy assuming he does not have any intolerances.  We will plan to go with low-dose ARB next and then possibly Aldactone.  Past Medical History:  Diagnosis Date  . COPD (chronic obstructive pulmonary disease) ([place])   . Essential hypertension   . History of asbestosis 2010  . History of cardiac catheterization 2012   Reportedly normal coronaries  . Respiratory failure with hypoxia (HCC)    Home oxygen    Past Surgical History:  Procedure Laterality Date  . CARDIAC CATHETERIZATION     x 2  . CHOLECYSTECTOMY     2015  . CYSTOSCOPY W/ RETROGRADES Bilateral 12/17/2017   Procedure: CYSTOSCOPY WITH RETROGRADE PYELOGRAM;  Surgeon: Ceasar Mons, MD;  Location: [facility];  Service: Urology;  Laterality: Bilateral;  . HEMORRHOID SURGERY    . HERNIA REPAIR  2008   Abdominal   . TRANSURETHRAL RESECTION OF BLADDER TUMOR N/A 12/17/2017   Procedure: TRANSURETHRAL RESECTION OF BLADDER TUMOR (TURBT);  Surgeon: Ceasar Mons, MD;  Location[facility];  Service: Urology;  Laterality: N/A;  . TRANSURETHRAL RESECTION OF BLADDER TUMOR N/A 02/11/2018   Procedure: TRANSURETHRAL RESECTION OF BLADDER TUMOR (TURBT) WITH INSTILLATION OF MITOMYCIN;   Surgeon: Ceasar Mons, MD;  Location: [facility];  Service: Urology;  Laterality: N/A;  ONLY NEEDS 30 MIN  . VASECTOMY  1998    Current Outpatient Medications  Medication Sig Dispense Refill  . albuterol (PROVENTIL) (2.5 MG/3ML) 0.083% nebulizer solution Take 2.5 mg by nebulization every 6 (six) hours as needed for wheezing or shortness of breath.    Marland Kitchen arformoterol (BROVANA) 15 MCG/2ML NEBU Take 2 mLs (15 mcg total) by nebulization 2 (two) times daily. 120 mL   . aspirin 81 MG chewable tablet Chew 1 tablet (81 mg total) by mouth daily. 30 tablet 0  . bisoprolol (ZEBETA) 5 MG tablet Take 0.5 tablets (2.5 mg total) by mouth daily. 45 tablet 0  . budesonide (PULMICORT) 0.5 MG/2ML nebulizer solution Take 2 mLs (0.5 mg total) by nebulization 2 (two) times daily.  12  . losartan (COZAAR) 25 MG tablet Take 0.5 tablets (12.5 mg total) by mouth daily. 45 tablet 0   No current facility-administered medications for this visit.    Allergies:  Codeine   Social History: The patient  reports that he quit smoking about 15 years ago. His smoking use included cigarettes. He has a 40.00 pack-year smoking history. He has never used smokeless tobacco. He reports that he drinks about 0.6 oz of alcohol per week. He reports that he does not use drugs.   ROS:  Please see the history of present illness. Otherwise, complete review of systems is positive for hearing loss.  All other systems  are reviewed and negative.   Physical Exam: VS:  BP 132/68   Pulse 64   Ht 5' 8.5" (1.74 m)   Wt 167 lb (75.8 kg)   SpO2 96%   BMI 25.02 kg/m , BMI Body mass index is 25.02 kg/m.  Wt Readings from Last 3 Encounters:  05/04/18 167 lb (75.8 kg)  03/04/18 165 lb (74.8 kg)  02/11/18 163 lb (73.9 kg)    General: Elderly male, appears comfortable at rest.  Has oxygen condenser but not wearing it this time. HEENT: Conjunctiva and lids normal, oropharynx clear. Neck: Supple, no elevated JVP or carotid bruits, no  thyromegaly. Lungs: Decreased breath sounds without wheezing, nonlabored breathing at rest. Cardiac: Distant RRR, no S3 or significant systolic murmur. Abdomen: Soft, nontender, bowel sounds present. Extremities: No pitting edema, distal pulses 2+. Skin: Warm and dry. Musculoskeletal: No kyphosis. Neuropsychiatric: Alert and oriented x3, affect grossly appropriate.  ECG: I personally reviewed the tracing from 02/05/2018 which showed probable sinus tachycardia with left bundle branch block.  Recent Labwork: 02/05/2018: ALT 20; AST 32; B Natriuretic Peptide 128.0; Magnesium 2.2 02/06/2018: BUN 15; Creatinine, Ser 0.86; Potassium 4.5; Sodium 139 02/08/2018: Hemoglobin 13.5; Platelets 245   Other Studies Reviewed Today:  Echocardiogram 02/06/2018: Study Conclusions  - Left ventricle: The cavity size was normal. Wall thickness was normal. Systolic function was mildly to moderately reduced. The estimated ejection fraction was = 40%. Doppler parameters are consistent with abnormal left ventricular relaxation (grade 1 diastolic dysfunction). - Regional wall motion abnormality: Akinesis of the apical myocardium. - Aortic valve: Mildly calcified annulus. Trileaflet; mildly thickened leaflets. Valve area (VTI): 2.95 cm^2. Valve area (Vmax): 2.67 cm^2. - Left atrium: The atrium was mildly dilated. - Pulmonary arteries: PA peak pressure: 31 mm Hg (S). PASP is borderline elevated. - Technically adequate study.  Assessment and Plan:  1.  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  I have not received records regarding previous cardiac catheterization as yet.  He does not report any active angina at this time.  He is tolerating low-dose bisoprolol, we will add Cozaar 12.5 mg daily next.  Follow-up BMET in 2 weeks and then clinical visit thereafter.  Likely to add Aldactone next.  2.  COPD and reported history of asbestosis.  He follows with a pulmonologist in  [place].  3.  Chronic hypoxic respiratory failure.  4.  Essential hypertension by history, blood pressure is reasonable today.  Current medicines were reviewed with the patient today.   Orders Placed This Encounter  Procedures  . Basic metabolic panel    Disposition: Follow-up in 6 weeks.  Signed, Satira Sark, MD, [facility] 05/04/2018 2:13 PM    [facility] [place], [place], [place] [address] Phone: [phone]; Fax: [phone]

## 2018-05-04 ENCOUNTER — Ambulatory Visit: Payer: Medicare Other | Admitting: Cardiology

## 2018-05-04 ENCOUNTER — Encounter: Payer: Self-pay | Admitting: Cardiology

## 2018-05-04 VITALS — BP 132/68 | HR 64 | Ht 68.5 in | Wt 167.0 lb

## 2018-05-04 DIAGNOSIS — J449 Chronic obstructive pulmonary disease, unspecified: Secondary | ICD-10-CM | POA: Diagnosis not present

## 2018-05-04 DIAGNOSIS — I10 Essential (primary) hypertension: Secondary | ICD-10-CM

## 2018-05-04 DIAGNOSIS — I429 Cardiomyopathy, unspecified: Secondary | ICD-10-CM

## 2018-05-04 MED ORDER — LOSARTAN POTASSIUM 25 MG PO TABS: 12.5000 mg | ORAL_TABLET | Freq: Every day | ORAL | 0 refills | Status: DC

## 2018-05-04 NOTE — Patient Instructions (Signed)
Medication Instructions:  Your physician has recommended you make the following change in your medication:    START Losartan 12.5 mg daily   Please continue all other medications as prescribed  Labwork:  BMET  In 2 weeks  Orders given today   Testing/Procedures: NONE  Follow-Up: Your physician recommends that you schedule a follow-up appointment in: [place].  Any Other Special Instructions Will Be Listed Below (If Applicable).  If you need a refill on your cardiac medications before your next appointment, please call your pharmacy.

## 2018-06-18 NOTE — Progress Notes (Deleted)
Cardiology Office Note  Date: 06/18/2018   ID: Timothy [name], DOB Jul 26, 1946, MRN [id number]  PCP: Patient, [name]  Primary Cardiologist: Rozann Lesches, MD   No chief complaint on file.   History of Present Illness: Timothy [name] is a 72 y.o. male last seen in July.  Low-dose Cozaar was added last visit, he is due for follow-up lab work.  Past Medical History:  Diagnosis Date  . COPD (chronic obstructive pulmonary disease) ([place])   . Essential hypertension   . History of asbestosis 2010  . History of cardiac catheterization 2012   Reportedly normal coronaries  . Respiratory failure with hypoxia (HCC)    Home oxygen    Past Surgical History:  Procedure Laterality Date  . CARDIAC CATHETERIZATION     x 2  . CHOLECYSTECTOMY     2015  . CYSTOSCOPY W/ RETROGRADES Bilateral 12/17/2017   Procedure: CYSTOSCOPY WITH RETROGRADE PYELOGRAM;  Surgeon: Ceasar Mons, MD;  Location: [facility];  Service: Urology;  Laterality: Bilateral;  . HEMORRHOID SURGERY    . HERNIA REPAIR  2008   Abdominal   . TRANSURETHRAL RESECTION OF BLADDER TUMOR N/A 12/17/2017   Procedure: TRANSURETHRAL RESECTION OF BLADDER TUMOR (TURBT);  Surgeon: Ceasar Mons, MD;  Location: [facility];  Service: Urology;  Laterality: N/A;  . TRANSURETHRAL RESECTION OF BLADDER TUMOR N/A 02/11/2018   Procedure: TRANSURETHRAL RESECTION OF BLADDER TUMOR (TURBT) WITH INSTILLATION OF MITOMYCIN;  Surgeon: Ceasar Mons, MD;  Location: [facility];  Service: Urology;  Laterality: N/A;  ONLY NEEDS 30 MIN  . VASECTOMY  1998    Current Outpatient Medications  Medication Sig Dispense Refill  . albuterol (PROVENTIL) (2.5 MG/3ML) 0.083% nebulizer solution Take 2.5 mg by nebulization every 6 (six) hours as needed for wheezing or shortness of breath.    Marland Kitchen arformoterol (BROVANA) 15 MCG/2ML NEBU Take 2 mLs (15 mcg total) by nebulization 2 (two) times daily. 120 mL   . aspirin 81 MG chewable tablet Chew 1  tablet (81 mg total) by mouth daily. 30 tablet 0  . bisoprolol (ZEBETA) 5 MG tablet Take 0.5 tablets (2.5 mg total) by mouth daily. 45 tablet 0  . budesonide (PULMICORT) 0.5 MG/2ML nebulizer solution Take 2 mLs (0.5 mg total) by nebulization 2 (two) times daily.  12  . losartan (COZAAR) 25 MG tablet Take 0.5 tablets (12.5 mg total) by mouth daily. 45 tablet 0   No current facility-administered medications for this visit.    Allergies:  Codeine   Social History: The patient  reports that he quit smoking about 15 years ago. His smoking use included cigarettes. He has a 40.00 pack-year smoking history. He has never used smokeless tobacco. He reports that he drinks about 1.0 standard drinks of alcohol per week. He reports that he does not use drugs.   Family History: The patient's family history includes Lung disease in his mother; Pancreatic cancer in his father.   ROS:  Please see the history of present illness. Otherwise, complete review of systems is positive for {NONE DEFAULTED:18576::"none"}.  All other systems are reviewed and negative.   Physical Exam: VS:  There were no vitals taken for this visit., BMI There is no height or weight on file to calculate BMI.  Wt Readings from Last 3 Encounters:  05/04/18 167 lb (75.8 kg)  03/04/18 165 lb (74.8 kg)  02/11/18 163 lb (73.9 kg)    General: Patient appears comfortable at rest. HEENT: Conjunctiva and lids normal,  oropharynx clear with moist mucosa. Neck: Supple, no elevated JVP or carotid bruits, no thyromegaly. Lungs: Clear to auscultation, nonlabored breathing at rest. Cardiac: Regular rate and rhythm, no S3 or significant systolic murmur, no pericardial rub. Abdomen: Soft, nontender, no hepatomegaly, bowel sounds present, no guarding or rebound. Extremities: No pitting edema, distal pulses 2+. Skin: Warm and dry. Musculoskeletal: No kyphosis. Neuropsychiatric: Alert and oriented x3, affect grossly appropriate.  ECG: I personally  reviewed the tracing from  Recent Labwork: 02/05/2018: ALT 20; AST 32; B Natriuretic Peptide 128.0; Magnesium 2.2 02/06/2018: BUN 15; Creatinine, Ser 0.86; Potassium 4.5; Sodium 139 02/08/2018: Hemoglobin 13.5; Platelets 245   Other Studies Reviewed Today:  Echocardiogram 02/06/2018: Study Conclusions  - Left ventricle: The cavity size was normal. Wall thickness was   normal. Systolic function was mildly to moderately reduced. The   estimated ejection fraction was = 40%. Doppler parameters are   consistent with abnormal left ventricular relaxation (grade 1   diastolic dysfunction). - Regional wall motion abnormality: Akinesis of the apical   myocardium. - Aortic valve: Mildly calcified annulus. Trileaflet; mildly   thickened leaflets. Valve area (VTI): 2.95 cm^2. Valve area   (Vmax): 2.67 cm^2. - Left atrium: The atrium was mildly dilated. - Pulmonary arteries: PA peak pressure: 31 mm Hg (S). PASP is   borderline elevated. - Technically adequate study.  Assessment and Plan:    Current medicines were reviewed with the patient today.  No orders of the defined types were placed in this encounter.   Disposition:  Signed, Satira Sark, MD, [facility] 06/18/2018 8:06 AM    [facility] [place], [place], [place] [address] Phone: [phone]; Fax: [phone]

## 2018-06-19 ENCOUNTER — Ambulatory Visit: Payer: Medicare Other | Admitting: Cardiology

## 2018-08-13 ENCOUNTER — Encounter: Payer: Self-pay | Admitting: *Deleted

## 2018-08-19 ENCOUNTER — Other Ambulatory Visit (HOSPITAL_COMMUNITY): Payer: Self-pay

## 2018-08-19 ENCOUNTER — Other Ambulatory Visit (HOSPITAL_COMMUNITY): Payer: Self-pay | Admitting: Family

## 2018-08-19 DIAGNOSIS — Z77090 Contact with and (suspected) exposure to asbestos: Secondary | ICD-10-CM

## 2018-08-19 DIAGNOSIS — J449 Chronic obstructive pulmonary disease, unspecified: Secondary | ICD-10-CM

## 2018-08-19 DIAGNOSIS — R0902 Hypoxemia: Secondary | ICD-10-CM

## 2018-09-01 ENCOUNTER — Ambulatory Visit (HOSPITAL_COMMUNITY): Payer: Medicare Other

## 2018-09-14 ENCOUNTER — Ambulatory Visit (HOSPITAL_COMMUNITY)
Admission: RE | Admit: 2018-09-14 | Discharge: 2018-09-14 | Disposition: A | Discharge status: Home / Self Care | Payer: Medicare Other | Source: Ambulatory Visit | Attending: Family | Admitting: Family

## 2018-09-14 ENCOUNTER — Encounter (HOSPITAL_COMMUNITY): Payer: Self-pay

## 2019-05-31 ENCOUNTER — Institutional Professional Consult (permissible substitution): Payer: Medicare Other | Admitting: Pulmonary Disease

## 2019-06-10 ENCOUNTER — Ambulatory Visit: Payer: Medicare Other | Admitting: Pulmonary Disease

## 2019-06-10 ENCOUNTER — Other Ambulatory Visit: Payer: Self-pay

## 2019-06-10 ENCOUNTER — Encounter: Payer: Self-pay | Admitting: Pulmonary Disease

## 2019-06-10 VITALS — BP 156/68 | HR 81 | Temp 98.1°F | Ht 66.5 in | Wt 171.0 lb

## 2019-06-10 DIAGNOSIS — J449 Chronic obstructive pulmonary disease, unspecified: Secondary | ICD-10-CM | POA: Diagnosis not present

## 2019-06-10 DIAGNOSIS — Z77090 Contact with and (suspected) exposure to asbestos: Secondary | ICD-10-CM | POA: Diagnosis not present

## 2019-06-10 MED ORDER — TRELEGY ELLIPTA 100-62.5-25 MCG/INH IN AEPB: 1.0000 | INHALATION_SPRAY | Freq: Every day | RESPIRATORY_TRACT | 6 refills | Status: DC

## 2019-06-10 NOTE — Progress Notes (Signed)
Subjective:   PATIENT ID: Timothy [name] GENDER: male DOB: 05/20/1946, MRN: [id number]   HPI  Chief Complaint  Patient presents with  . Consult    exposure to asbestosis    Reason for Visit: New consult for COPD, hx asbestos exposure  Timothy [name] is a 73 year old retired male with COPD who presents for management of his COPD.  He has previously been followed by Pulmonology since 2010 with Dr. Posey Pronto in [place] for his COPD. He is wishing to establish care with a Pulmonary office closer to his hourse. He is well-controlled and compliant with his Trelegy inhaler. Reports chronic shortness of breath with exertion. Associated with wheezing. Denies cough. He has an oxygen concentrator which he purchased and uses continuously especially when he leaves the house. In the past, he reports trying multiple inhalers including nebulizers without any effect. States he was on Budesonide and brovana for 9 years. He tried Trelegy which he felt it improved. Last COPD exacerbation which required hospitalization in Mar 25, 2018.  His previous employer died from respiratory issues related to work. States he is being followed for plaques with last scan around February. Denies fevers, chills, unintentional weight loss, night sweats.  Social History: Quit in 2004. 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   I have personally reviewed patient's past medical/family/social history, allergies, current medications.  Past Medical History:  Diagnosis Date  . COPD (chronic obstructive pulmonary disease) ([place])   . Essential hypertension   . History of asbestosis 2010  . History of cardiac catheterization 2012   Reportedly normal coronaries  . Respiratory failure with hypoxia (HCC)    Home oxygen     Family History  Problem Relation Age of Onset  . Lung disease Mother        Asbestosis.  . Pancreatic cancer Father      Social History   Occupational History  . Not on file   Tobacco Use  . Smoking status: Former Smoker    Packs/day: 1.00    Years: 40.00    Pack years: 40.00    Types: Cigarettes    Quit date: 2004    Years since quitting: 16.6  . Smokeless tobacco: Never Used  Substance and Sexual Activity  . Alcohol use: Yes    Alcohol/week: 1.0 standard drinks    Types: 1 Cans of beer per week    Comment: occas  . Drug use: No  . Sexual activity: Not on file    Allergies  Allergen Reactions  . Codeine Itching     Outpatient Medications Prior to Visit  Medication Sig Dispense Refill  . albuterol (PROVENTIL) (2.5 MG/3ML) 0.083% nebulizer solution Take 2.5 mg by nebulization every 6 (six) hours as needed for wheezing or shortness of breath.    Marland Kitchen arformoterol (BROVANA) 15 MCG/2ML NEBU Take 2 mLs (15 mcg total) by nebulization 2 (two) times daily. 120 mL   . aspirin 81 MG chewable tablet Chew 1 tablet (81 mg total) by mouth daily. 30 tablet 0  . bisoprolol (ZEBETA) 5 MG tablet Take 0.5 tablets (2.5 mg total) by mouth daily. 45 tablet 0  . budesonide (PULMICORT) 0.5 MG/2ML nebulizer solution Take 2 mLs (0.5 mg total) by nebulization 2 (two) times daily.  12  . losartan (COZAAR) 25 MG tablet Take 0.5 tablets (12.5 mg total) by mouth daily. 45 tablet 0   No facility-administered medications prior to visit.     Review of Systems  Constitutional:  Negative for chills, diaphoresis, fever, malaise/fatigue and weight loss.  HENT: Negative for congestion, ear pain and sore throat.   Respiratory: Positive for shortness of breath and wheezing. Negative for cough, hemoptysis and sputum production.   Cardiovascular: Negative for chest pain, palpitations and leg swelling.  Gastrointestinal: Negative for abdominal pain, heartburn and nausea.  Genitourinary: Negative for frequency.  Musculoskeletal: Negative for joint pain and myalgias.  Skin: Negative for itching and rash.  Neurological: Negative for dizziness, weakness and headaches.  Endo/Heme/Allergies:  Does not bruise/bleed easily.  Psychiatric/Behavioral: Negative for depression. The patient is not nervous/anxious.      Objective:   Vitals:   06/10/19 1026  BP: (!) 156/68  Pulse: 81  Temp: 98.1 F (36.7 C)  TempSrc: Oral  SpO2: 93%  Weight: 171 lb (77.6 kg)  Height: 5' 6.5" (1.689 m)      Physical Exam: General: Well-appearing, no acute distress HENT: [place], AT, OP clear, MMM Eyes: EOMI, no scleral icterus Respiratory: Clear to auscultation bilaterally.  No crackles, wheezing or rales Cardiovascular: RRR, -M/R/G, no JVD GI: BS+, soft, nontender Extremities:-Edema,-tenderness Neuro: AAO x4, CNII-XII grossly intact Skin: Intact, no rashes or bruising Psych: Normal mood, normal affect  Data Reviewed:  Imaging: 11/17/18 CT Chest in PACS reviewed: 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 Unable to visualize 15mm pulmonary nodule in RLL mentioned in report due to architectural distortion  PFT: 12/31/11 Interpretation from [facility]: Severe obstructive along with restrictive lung disease  Labs: 07/04/16 CBC Hg 15.8 07/04/16 CMP Glucose 102. Remaining electrolytes unremarkable  Ambulatory O2 No indication for supplemental O2. Sats >91% at rest and ambulation.  Imaging, labs and tests noted above have been reviewed independently by me.    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  COPD/severe emphysema --CONTINUE your Trelegy inhaler as directed. REFILL provided. --Pulmonary function tests ordered to evaluate your lungs prior to our next visit  Abnormal CT Chest, Asbestosis Exposure, ?57mm pulmonary nodule --We will obtain records from [facility] --Next CT Chest due in January 2021  We will request records from Dr. Posey Pronto, Pulmonary.  Health Maintenance Pneumonia Prevnar - 08/26/15 and Pneumovax 08/12/12 Influenza 08/09/18  Orders Placed  This Encounter  Procedures  . Pulmonary function test    Standing Status:   Future    Standing Expiration Date:   06/09/2020    Order Specific Question:   Where should this test be performed?    Answer:   [facility]    Order Specific Question:   Full PFT: includes the following: basic spirometry, spirometry pre & post bronchodilator, diffusion capacity (DLCO), lung volumes    Answer:   Full PFT   Meds ordered this encounter  Medications  . Fluticasone-Umeclidin-Vilant (TRELEGY ELLIPTA) 100-62.5-25 MCG/INH AEPB    Sig: Take 1 puff by mouth daily.    Dispense:  1 each    Refill:  6    Return in about 3 months (around 09/10/2019).  [place], MD [facility] 06/10/2019 8:40 AM  Office Number [phone]

## 2019-06-10 NOTE — Patient Instructions (Addendum)
Presumed COPD --CONTINUE your Trelegy inhaler as directed. REFILL provided. --Pulmonary function tests ordered to evaluate your lungs prior to our next visit  Abnormal CT Chest, Asbestosis Exposure --We will obtain records from [facility] center --Next CT Chest due in January 2021   We will request records from Dr. Posey Pronto, Pulmonary.  Follow-up with me in 3 months

## 2019-09-15 ENCOUNTER — Encounter: Payer: Self-pay | Admitting: Primary Care

## 2019-09-15 ENCOUNTER — Ambulatory Visit: Payer: Medicare Other | Admitting: Primary Care

## 2019-09-15 ENCOUNTER — Other Ambulatory Visit: Payer: Self-pay

## 2019-09-15 DIAGNOSIS — J920 Pleural plaque with presence of asbestos: Secondary | ICD-10-CM | POA: Diagnosis not present

## 2019-09-15 DIAGNOSIS — Z23 Encounter for immunization: Secondary | ICD-10-CM

## 2019-09-15 NOTE — Assessment & Plan Note (Signed)
-   CT January 2020 at outside facility showed bilateral calcified pleural plaques - Repeat CT chest due Jan 2021

## 2019-09-15 NOTE — Patient Instructions (Addendum)
Pleasure meeting you today Mr. Jokerst  Recommendations: Continue Trelegy 1 puff daily Use albuterol every 6 hours as needed for breakthrough shortness of breath/wheezing  Orders: CT chest wo contrast in January/February 2021 re: asbestosis exposure/pleural plaques  Office treatment: Received high dose flu shot today  Follow-up: 6 months with Dr. Loanne Drilling or sooner if needed     Influenza Virus Vaccine (Flucelvax) What is this medicine? INFLUENZA VIRUS VACCINE (in floo EN zuh VAHY ruhs vak SEEN) helps to reduce the risk of getting influenza also known as the flu. The vaccine only helps protect you against some strains of the flu. This medicine may be used for other purposes; ask your health care provider or pharmacist if you have questions. COMMON BRAND NAME(S): FLUCELVAX What should I tell my health care provider before I take this medicine? They need to know if you have any of these conditions:  bleeding disorder like hemophilia  fever or infection  Guillain-Barre syndrome or other neurological problems  immune system problems  infection with the human immunodeficiency virus (HIV) or AIDS  low blood platelet counts  multiple sclerosis  an unusual or allergic reaction to influenza virus vaccine, other medicines, foods, dyes or preservatives  pregnant or trying to get pregnant  breast-feeding How should I use this medicine? This vaccine is for injection into a muscle. It is given by a health care professional. A copy of Vaccine Information Statements will be given before each vaccination. Read this sheet carefully each time. The sheet may change frequently. Talk to your pediatrician regarding the use of this medicine in children. Special care may be needed. Overdosage: If you think you've taken too much of this medicine contact a poison control center or emergency room at once. Overdosage: If you think you have taken too much of this medicine contact a poison control  center or emergency room at once. NOTE: This medicine is only for you. Do not share this medicine with others. What if I miss a dose? This does not apply. What may interact with this medicine?  chemotherapy or radiation therapy  medicines that lower your immune system like etanercept, anakinra, infliximab, and adalimumab  medicines that treat or prevent blood clots like warfarin  phenytoin  steroid medicines like prednisone or cortisone  theophylline  vaccines This list may not describe all possible interactions. Give your health care provider a list of all the medicines, herbs, non-prescription drugs, or dietary supplements you use. Also tell them if you smoke, drink alcohol, or use illegal drugs. Some items may interact with your medicine. What should I watch for while using this medicine? Report any side effects that do not go away within 3 days to your doctor or health care professional. Call your health care provider if any unusual symptoms occur within 6 weeks of receiving this vaccine. You may still catch the flu, but the illness is not usually as bad. You cannot get the flu from the vaccine. The vaccine will not protect against colds or other illnesses that may cause fever. The vaccine is needed every year. What side effects may I notice from receiving this medicine? Side effects that you should report to your doctor or health care professional as soon as possible:  allergic reactions like skin rash, itching or hives, swelling of the face, lips, or tongue Side effects that usually do not require medical attention (Report these to your doctor or health care professional if they continue or are bothersome.):  fever  headache  muscle aches  and pains  pain, tenderness, redness, or swelling at the injection site  tiredness This list may not describe all possible side effects. Call your doctor for medical advice about side effects. You may report side effects to FDA at  1-800-FDA-1088. Where should I keep my medicine? The vaccine will be given by a health care professional in a clinic, pharmacy, doctor's office, or other health care setting. You will not be given vaccine doses to store at home. NOTE: This sheet is a summary. It may not cover all possible information. If you have questions about this medicine, talk to your doctor, pharmacist, or health care provider.  2020 Elsevier/Gold Standard (2011-09-25 14:06:47)

## 2019-09-15 NOTE — Assessment & Plan Note (Addendum)
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

## 2019-09-15 NOTE — Progress Notes (Signed)
@Patient  ID: Timothy [name], male    DOB: Sep 22, 1946, 73 y.o.   MRN: [id number]  Chief Complaint  Patient presents with  . Asbestos Exposure, Severe COPD    3 Month Follow Up.    Referring provider: No ref. provider found  HPI: 73 year old male, former smoker quit 2004 (42 pack year hx). PMH significant for COPD, respiratory failure, asbestosis exposure, hypertension, chronic diastolic heart failure. Patient of Dr. Loanne Drilling, seen for initial consult on 05/29/19. Previously followed by Pulmonologist Dr. Posey Pronto in [place] since 2010. Hospitalized for COPD exacerbation in May 2019. Maintained on Trelegy. Due for CT chest in January 2021. Ordered for PFTs. Up to date with pneumonia vaccines. Due for influenza vaccine today.   09/15/2019 Patient presents today for 3 month follow-up. Breathing is baseline, no acute complaints. Continues Trelegy 1 puff daily. He has only required his albuterol nebulizer twice since last visit. He remains active and is able to complete all of his normal ADLs. He refused pre-procedural covid testing making him unable to complete pulmonary function testing. Denies cough, chest tightness or wheezing.      Data reviewed Imaging: 11/17/18 CT Chest in PACS reviewed: 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 Unable to visualize 23mm pulmonary nodule in RLL mentioned in report due to architectural distortion  PFT: 12/31/11 Interpretation from [facility]: Severe obstructive along with restrictive lung disease  Allergies  Allergen Reactions  . Codeine Itching    Immunization History  Administered Date(s) Administered  . Fluad Quad(high Dose 65+) 09/15/2019  . Influenza Whole 08/09/2018  . Pneumococcal Conjugate-13 08/26/2015  . Pneumococcal Polysaccharide-23 08/12/2012    Past Medical History:  Diagnosis Date  . COPD (chronic obstructive pulmonary disease) ([place])   . Essential hypertension   . History of asbestosis 2010  . History of  cardiac catheterization 2012   Reportedly normal coronaries  . Respiratory failure with hypoxia (HCC)    Home oxygen    Tobacco History: Social History   Tobacco Use  Smoking Status Former Smoker  . Packs/day: 1.00  . Years: 42.00  . Pack years: 42.00  . Types: Cigarettes  . Start date: 33  . Quit date: 2004  . Years since quitting: 16.8  Smokeless Tobacco Never Used   Counseling given: Not Answered   Outpatient Medications Prior to Visit  Medication Sig Dispense Refill  . albuterol (PROVENTIL) (2.5 MG/3ML) 0.083% nebulizer solution Take 2.5 mg by nebulization every 6 (six) hours as needed for wheezing or shortness of breath.    Marland Kitchen aspirin 81 MG chewable tablet Chew 1 tablet (81 mg total) by mouth daily. 30 tablet 0  . bisoprolol (ZEBETA) 5 MG tablet Take 0.5 tablets (2.5 mg total) by mouth daily. 45 tablet 0  . Fluticasone-Umeclidin-Vilant (TRELEGY ELLIPTA) 100-62.5-25 MCG/INH AEPB Take 1 puff by mouth daily. 1 each 6  . losartan (COZAAR) 25 MG tablet Take 0.5 tablets (12.5 mg total) by mouth daily. 45 tablet 0   No facility-administered medications prior to visit.    Review of Systems  Review of Systems  Constitutional: Negative.   HENT: Negative.   Respiratory: Negative for cough, chest tightness, shortness of breath and wheezing.    Physical Exam  BP (!) 152/76 (BP Location: Left Arm, Patient Position: Sitting, Cuff Size: Normal)   Pulse 78   Temp 97.9 F (36.6 C)   Ht 5' 6.5" (1.689 m)   Wt 168 lb 9.6 oz (76.5 kg)   SpO2 94% Comment: on room air  BMI 26.81 kg/m  Physical Exam Constitutional:      Appearance: Normal appearance.  HENT:     Head: Normocephalic and atraumatic.     Mouth/Throat:     Mouth: Mucous membranes are moist.     Pharynx: Oropharynx is clear.  Cardiovascular:     Rate and Rhythm: Normal rate.  Pulmonary:     Effort: Pulmonary effort is normal.     Breath sounds: No wheezing, rhonchi or rales.     Comments: CTA Musculoskeletal:  Normal range of motion.  Skin:    General: Skin is warm and dry.  Neurological:     General: No focal deficit present.     Mental Status: He is alert and oriented to person, place, and time. Mental status is at baseline.  Psychiatric:        Mood and Affect: Mood normal.        Behavior: Behavior normal.        Thought Content: Thought content normal.        Judgment: Judgment normal.      Lab Results:  CBC    Component Value Date/Time   WBC 12.2 (H) 02/08/2018 0632   RBC 4.58 02/08/2018 0632   HGB 13.5 02/08/2018 0632   HCT 43.2 02/08/2018 0632   PLT 245 02/08/2018 0632   MCV 94.3 02/08/2018 0632   MCH 29.5 02/08/2018 0632   MCHC 31.3 02/08/2018 0632   RDW 14.5 02/08/2018 0632   LYMPHSABS 1.1 02/08/2018 0632   MONOABS 0.3 02/08/2018 0632   EOSABS 0.0 02/08/2018 0632   BASOSABS 0.0 02/08/2018 0632    BMET    Component Value Date/Time   NA 139 02/06/2018 0357   K 4.5 02/06/2018 0357   CL 107 02/06/2018 0357   CO2 22 02/06/2018 0357   GLUCOSE 171 (H) 02/06/2018 0357   BUN 15 02/06/2018 0357   CREATININE 0.86 02/06/2018 0357   CALCIUM 8.3 (L) 02/06/2018 0357   GFRNONAA >60 02/06/2018 0357   GFRAA >60 02/06/2018 0357    BNP    Component Value Date/Time   BNP 128.0 (H) 02/05/2018 1940    ProBNP No results found for: PROBNP  Imaging: No results found.   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

## 2019-09-16 ENCOUNTER — Ambulatory Visit: Payer: Medicare Other | Admitting: Pulmonary Disease

## 2019-11-03 DIAGNOSIS — J449 Chronic obstructive pulmonary disease, unspecified: Secondary | ICD-10-CM | POA: Diagnosis not present

## 2019-11-03 DIAGNOSIS — J61 Pneumoconiosis due to asbestos and other mineral fibers: Secondary | ICD-10-CM | POA: Diagnosis not present

## 2019-11-15 DIAGNOSIS — Z8551 Personal history of malignant neoplasm of bladder: Secondary | ICD-10-CM | POA: Diagnosis not present

## 2019-12-04 DIAGNOSIS — J449 Chronic obstructive pulmonary disease, unspecified: Secondary | ICD-10-CM | POA: Diagnosis not present

## 2019-12-04 DIAGNOSIS — J61 Pneumoconiosis due to asbestos and other mineral fibers: Secondary | ICD-10-CM | POA: Diagnosis not present

## 2020-01-01 DIAGNOSIS — J449 Chronic obstructive pulmonary disease, unspecified: Secondary | ICD-10-CM | POA: Diagnosis not present

## 2020-01-01 DIAGNOSIS — J61 Pneumoconiosis due to asbestos and other mineral fibers: Secondary | ICD-10-CM | POA: Diagnosis not present

## 2020-02-01 DIAGNOSIS — J449 Chronic obstructive pulmonary disease, unspecified: Secondary | ICD-10-CM | POA: Diagnosis not present

## 2020-02-01 DIAGNOSIS — J61 Pneumoconiosis due to asbestos and other mineral fibers: Secondary | ICD-10-CM | POA: Diagnosis not present

## 2020-02-17 DIAGNOSIS — Z8551 Personal history of malignant neoplasm of bladder: Secondary | ICD-10-CM | POA: Diagnosis not present

## 2020-02-21 ENCOUNTER — Ambulatory Visit: Payer: Medicare Other | Admitting: Primary Care

## 2020-02-21 ENCOUNTER — Other Ambulatory Visit: Payer: Self-pay

## 2020-02-21 ENCOUNTER — Encounter: Payer: Self-pay | Admitting: Primary Care

## 2020-02-21 VITALS — BP 136/82 | HR 84 | Temp 97.4°F | Ht 66.5 in | Wt 172.4 lb

## 2020-02-21 DIAGNOSIS — J439 Emphysema, unspecified: Secondary | ICD-10-CM

## 2020-02-21 DIAGNOSIS — R9389 Abnormal findings on diagnostic imaging of other specified body structures: Secondary | ICD-10-CM | POA: Diagnosis not present

## 2020-02-21 DIAGNOSIS — J920 Pleural plaque with presence of asbestos: Secondary | ICD-10-CM

## 2020-02-21 MED ORDER — ALBUTEROL SULFATE HFA 108 (90 BASE) MCG/ACT IN AERS: 2.0000 | INHALATION_SPRAY | Freq: Four times a day (QID) | RESPIRATORY_TRACT | 3 refills | Status: DC | PRN

## 2020-02-21 MED ORDER — TRELEGY ELLIPTA 100-62.5-25 MCG/INH IN AEPB: 1.0000 | INHALATION_SPRAY | Freq: Every day | RESPIRATORY_TRACT | 6 refills | Status: DC

## 2020-02-21 MED ORDER — ALBUTEROL SULFATE (2.5 MG/3ML) 0.083% IN NEBU: 2.5000 mg | INHALATION_SOLUTION | Freq: Four times a day (QID) | RESPIRATORY_TRACT | 6 refills | Status: DC | PRN

## 2020-02-21 NOTE — Patient Instructions (Addendum)
Continue Trelegy 1 puff daily (rinse mouth after use Use albuterol rescue inhaler or nebulizer every 4-6 hours as needed for breakthrough shortness of breath/wheezing Continue to stay active Continue 1 L oxygen at rest; 4L on exertion   Refills sent: Albuterol nebulizer solution  Albuterol rescue inhaler  Trelegy Elipta  Follow-up: 6 months with Dr. Loanne Drilling    COPD and Physical Activity Chronic obstructive pulmonary disease (COPD) is a long-term (chronic) condition that affects the lungs. COPD is a general term that can be used to describe many different lung problems that cause lung swelling (inflammation) and limit airflow, including chronic bronchitis and emphysema. The main symptom of COPD is shortness of breath, which makes it harder to do even simple tasks. This can also make it harder to exercise and be active. Talk with your health care provider about treatments to help you breathe better and actions you can take to prevent breathing problems during physical activity. What are the benefits of exercising with COPD? Exercising regularly is an important part of a healthy lifestyle. You can still exercise and do physical activities even though you have COPD. Exercise and physical activity improve your shortness of breath by increasing blood flow (circulation). This causes your heart to pump more oxygen through your body. Moderate exercise can improve your:  Oxygen use.  Energy level.  Shortness of breath.  Strength in your breathing muscles.  Heart health.  Sleep.  Self-esteem and feelings of self-worth.  Depression, stress, and anxiety levels. Exercise can benefit everyone with COPD. The severity of your disease may affect how hard you can exercise, especially at first, but everyone can benefit. Talk with your health care provider about how much exercise is safe for you, and which activities and exercises are safe for you. What actions can I take to prevent breathing problems  during physical activity?  Sign up for a pulmonary rehabilitation program. This type of program may include: ? Education about lung diseases. ? Exercise classes that teach you how to exercise and be more active while improving your breathing. This usually involves:  Exercise using your lower extremities, such as a stationary bicycle.  About 30 minutes of exercise, 2 to 5 times per week, for 6 to 12 weeks  Strength training, such as push ups or leg lifts. ? Nutrition education. ? Group classes in which you can talk with others who also have COPD and learn ways to manage stress.  If you use an oxygen tank, you should use it while you exercise. Work with your health care provider to adjust your oxygen for your physical activity. Your resting flow rate is different from your flow rate during physical activity.  While you are exercising: ? Take slow breaths. ? Pace yourself and do not try to go too fast. ? Purse your lips while breathing out. Pursing your lips is similar to a kissing or whistling position. ? If doing exercise that uses a quick burst of effort, such as weight lifting:  Breathe in before starting the exercise.  Breathe out during the hardest part of the exercise (such as raising the weights). Where to find support You can find support for exercising with COPD from:  Your health care provider.  A pulmonary rehabilitation program.  Your local health department or community health programs.  Support groups, online or in-person. Your health care provider may be able to recommend support groups. Where to find more information You can find more information about exercising with COPD from:  American Lung  Association: ClassInsider.se.  COPD Foundation: https://www.rivera.net/. Contact a health care provider if:  Your symptoms get worse.  You have chest pain.  You have nausea.  You have a fever.  You have trouble talking or catching your breath.  You want to start a new  exercise program or a new activity. Summary  COPD is a general term that can be used to describe many different lung problems that cause lung swelling (inflammation) and limit airflow. This includes chronic bronchitis and emphysema.  Exercise and physical activity improve your shortness of breath by increasing blood flow (circulation). This causes your heart to provide more oxygen to your body.  Contact your health care provider before starting any exercise program or new activity. Ask your health care provider what exercises and activities are safe for you. This information is not intended to replace advice given to you by your health care provider. Make sure you discuss any questions you have with your health care provider. Document Revised: 02/03/2019 Document Reviewed: 11/06/2017 Elsevier Patient Education  2020 [employer].

## 2020-02-21 NOTE — Assessment & Plan Note (Signed)
-   CT January 2020 at outside facility showed bilateral calcified pleural plaques - He was due for repeat CT chest in Jan 2021 (needs to be scheduled)

## 2020-02-21 NOTE — Assessment & Plan Note (Signed)
-   Stable, no recent exacerbations or hospitalizations  - Continue Trelegy Ellipta 100  - Use albuterol hfa or nebulizer every 4-6 hours as needed for breakthrough shortness of breath/wheezing - He could benefit from pulmonary rehab, patient will consider and notify us if he wishes order to be placed  - Continue supplemental oxygen (1L oxygen at rest; 4L on exertion) - Follow-up: 6 months with Dr. Loanne Drilling

## 2020-02-21 NOTE — Progress Notes (Signed)
@Patient  ID: Timothy [name], male    DOB: January 11, 1946, 74 y.o.   MRN: [id number]  Chief Complaint  Patient presents with  . Follow-up    4 month f/u. Increased SOB for the past 3 months. Uses around 2-3L of O2. Denies any coughing.      Referring provider: No ref. provider found  HPI:  74 year old male, former smoker quit 2004 (42 pack year hx). PMH significant for COPD, respiratory failure, asbestosis exposure, hypertension, chronic diastolic heart failure. Patient of Dr. Loanne Drilling, seen for initial consult on 05/29/19. Previously followed by Pulmonologist Dr. Posey Pronto in [place] since 2010. Hospitalized for COPD exacerbation in May 2019. Maintained on Trelegy. Due for CT chest in January 2021. Ordered for PFTs. Up to date with pneumonia vaccines. Due for influenza vaccine today.   Previous [facility] pulmonary encounters: 09/15/2019 Patient presents today for 3 month follow-up. Breathing is baseline, no acute complaints. Continues Trelegy 1 puff daily. He has only required his albuterol nebulizer twice since last visit. He remains active and is able to complete all of his normal ADLs. He refused pre-procedural covid testing making him unable to complete pulmonary function testing. Denies cough, chest tightness or wheezing.      02/21/2020 Patient presents today 5-6 month follow-up. He is doing well. States Trelegy has been working well for him since he has been on it the last coupld of years. No recent hospitalizations or COPD exacerbations since last visit. He is sleeping well at night. Needs refill of Trelegy. Uses 1L of oxygen at rest and 4L on exertion. Remains active. He is unsure if he would like to attend pulmonary rehab at this time, he works out at home but will consider it. Denies cough, chest tightness or wheezing.   Data reviewed Imaging: 11/20/18 CT Chest in PACS reviewed: Severe COPD. 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 Codeine Itching    Immunization History  Administered Date(s) Administered  . Fluad Quad(high Dose 65+) 09/15/2019  . Influenza Whole 08/09/2018  . Pneumococcal Conjugate-13 08/26/2015  . Pneumococcal Polysaccharide-23 08/12/2012    Past Medical History:  Diagnosis Date  . COPD (chronic obstructive pulmonary disease) ([place])   . Essential hypertension   . History of asbestosis 2010  . History of cardiac catheterization 2012   Reportedly normal coronaries  . Respiratory failure with hypoxia (HCC)    Home oxygen    Tobacco History: Social History   Tobacco Use  Smoking Status Former Smoker  . Packs/day: 1.00  . Years: 42.00  . Pack years: 42.00  . Types: Cigarettes  . Start date: 48  . Quit date: 2004  . Years since quitting: 17.3  Smokeless Tobacco Never Used   Counseling given: Not Answered   Outpatient Medications Prior to Visit  Medication Sig Dispense Refill  . aspirin 81 MG chewable tablet Chew 1 tablet (81 mg total) by mouth daily. 30 tablet 0  . bisoprolol (ZEBETA) 5 MG tablet Take 0.5 tablets (2.5 mg total) by mouth daily. 45 tablet 0  . albuterol (PROVENTIL) (2.5 MG/3ML) 0.083% nebulizer solution Take 2.5 mg by nebulization every 6 (six) hours as needed for wheezing or shortness of breath.    . Fluticasone-Umeclidin-Vilant (TRELEGY ELLIPTA) 100-62.5-25 MCG/INH AEPB Take 1 puff by mouth daily. 1 each  6   No facility-administered medications prior to visit.    Review of Systems  Review of Systems  Constitutional: Negative for diaphoresis and fatigue.  HENT: Negative for congestion.   Respiratory: Negative for cough, shortness of breath and wheezing.        DOE    Physical Exam  BP 136/82   Pulse 84   Temp (!) 97.4 F (36.3 C) (Temporal)    Ht 5' 6.5" (1.689 m)   Wt 172 lb 6.4 oz (78.2 kg)   SpO2 96% Comment: on 2L  BMI 27.41 kg/m  Physical Exam Constitutional:      Appearance: Normal appearance.  HENT:     Mouth/Throat:     Mouth: Mucous membranes are moist.     Pharynx: Oropharynx is clear.  Cardiovascular:     Rate and Rhythm: Normal rate and regular rhythm.  Pulmonary:     Effort: Pulmonary effort is normal.     Breath sounds: No wheezing.     Comments: Diminished ; on 1L oxygen  Musculoskeletal:        General: Normal range of motion.  Neurological:     General: No focal deficit present.     Mental Status: He is alert and oriented to person, place, and time. Mental status is at baseline.  Psychiatric:        Mood and Affect: Mood normal.        Behavior: Behavior normal.        Thought Content: Thought content normal.        Judgment: Judgment normal.      Lab Results:  CBC    Component Value Date/Time   WBC 12.2 (H) 02/08/2018 0632   RBC 4.58 02/08/2018 0632   HGB 13.5 02/08/2018 0632   HCT 43.2 02/08/2018 0632   PLT 245 02/08/2018 0632   MCV 94.3 02/08/2018 0632   MCH 29.5 02/08/2018 0632   MCHC 31.3 02/08/2018 0632   RDW 14.5 02/08/2018 0632   LYMPHSABS 1.1 02/08/2018 0632   MONOABS 0.3 02/08/2018 0632   EOSABS 0.0 02/08/2018 0632   BASOSABS 0.0 02/08/2018 0632    BMET    Component Value Date/Time   NA 139 02/06/2018 0357   K 4.5 02/06/2018 0357   CL 107 02/06/2018 0357   CO2 22 02/06/2018 0357   GLUCOSE 171 (H) 02/06/2018 0357   BUN 15 02/06/2018 0357   CREATININE 0.86 02/06/2018 0357   CALCIUM 8.3 (L) 02/06/2018 0357   GFRNONAA >60 02/06/2018 0357   GFRAA >60 02/06/2018 0357    BNP    Component Value Date/Time   BNP 128.0 (H) 02/05/2018 1940    ProBNP No results found for: PROBNP  Imaging: No results found.   Assessment & Plan:   COPD (chronic obstructive pulmonary disease) ([place]) - Stable, no recent exacerbations or hospitalizations  - Continue Trelegy  Ellipta 100  - Use albuterol hfa or nebulizer every 4-6 hours as needed for breakthrough shortness of breath/wheezing - He could benefit from pulmonary rehab, patient will consider and notify us if he wishes order to be placed  - Continue supplemental oxygen (1L oxygen at rest; 4L on exertion) - Follow-up: 6 months with Dr. Loanne Drilling   Asbestos-induced pleural plaque - CT January 2020 at outside facility showed bilateral calcified pleural plaques - He was due for repeat CT chest in Jan 2021 (needs to be scheduled)     Martyn Ehrich, NP 02/21/2020

## 2020-03-02 DIAGNOSIS — J449 Chronic obstructive pulmonary disease, unspecified: Secondary | ICD-10-CM | POA: Diagnosis not present

## 2020-03-02 DIAGNOSIS — J61 Pneumoconiosis due to asbestos and other mineral fibers: Secondary | ICD-10-CM | POA: Diagnosis not present

## 2020-04-02 DIAGNOSIS — J61 Pneumoconiosis due to asbestos and other mineral fibers: Secondary | ICD-10-CM | POA: Diagnosis not present

## 2020-04-02 DIAGNOSIS — J449 Chronic obstructive pulmonary disease, unspecified: Secondary | ICD-10-CM | POA: Diagnosis not present

## 2020-04-11 DIAGNOSIS — J61 Pneumoconiosis due to asbestos and other mineral fibers: Secondary | ICD-10-CM | POA: Diagnosis not present

## 2020-04-11 DIAGNOSIS — Z299 Encounter for prophylactic measures, unspecified: Secondary | ICD-10-CM | POA: Diagnosis not present

## 2020-04-11 DIAGNOSIS — J449 Chronic obstructive pulmonary disease, unspecified: Secondary | ICD-10-CM | POA: Diagnosis not present

## 2020-04-11 DIAGNOSIS — I10 Essential (primary) hypertension: Secondary | ICD-10-CM | POA: Diagnosis not present

## 2020-04-11 DIAGNOSIS — C679 Malignant neoplasm of bladder, unspecified: Secondary | ICD-10-CM | POA: Diagnosis not present

## 2020-04-11 DIAGNOSIS — W57XXXA Bitten or stung by nonvenomous insect and other nonvenomous arthropods, initial encounter: Secondary | ICD-10-CM | POA: Diagnosis not present

## 2020-05-02 DIAGNOSIS — J449 Chronic obstructive pulmonary disease, unspecified: Secondary | ICD-10-CM | POA: Diagnosis not present

## 2020-05-02 DIAGNOSIS — J61 Pneumoconiosis due to asbestos and other mineral fibers: Secondary | ICD-10-CM | POA: Diagnosis not present

## 2020-05-09 DIAGNOSIS — Z299 Encounter for prophylactic measures, unspecified: Secondary | ICD-10-CM | POA: Diagnosis not present

## 2020-05-09 DIAGNOSIS — Z7189 Other specified counseling: Secondary | ICD-10-CM | POA: Diagnosis not present

## 2020-05-09 DIAGNOSIS — Z0000 Encounter for general adult medical examination without abnormal findings: Secondary | ICD-10-CM | POA: Diagnosis not present

## 2020-05-09 DIAGNOSIS — Z79899 Other long term (current) drug therapy: Secondary | ICD-10-CM | POA: Diagnosis not present

## 2020-05-09 DIAGNOSIS — I10 Essential (primary) hypertension: Secondary | ICD-10-CM | POA: Diagnosis not present

## 2020-05-24 DIAGNOSIS — J449 Chronic obstructive pulmonary disease, unspecified: Secondary | ICD-10-CM | POA: Diagnosis not present

## 2020-05-24 DIAGNOSIS — Z885 Allergy status to narcotic agent status: Secondary | ICD-10-CM | POA: Diagnosis not present

## 2020-05-24 DIAGNOSIS — Z7951 Long term (current) use of inhaled steroids: Secondary | ICD-10-CM | POA: Diagnosis not present

## 2020-05-24 DIAGNOSIS — R0602 Shortness of breath: Secondary | ICD-10-CM | POA: Diagnosis not present

## 2020-05-24 DIAGNOSIS — J439 Emphysema, unspecified: Secondary | ICD-10-CM | POA: Diagnosis not present

## 2020-05-24 DIAGNOSIS — I700 Atherosclerosis of aorta: Secondary | ICD-10-CM | POA: Diagnosis not present

## 2020-05-24 DIAGNOSIS — Z5329 Procedure and treatment not carried out because of patient's decision for other reasons: Secondary | ICD-10-CM | POA: Diagnosis not present

## 2020-05-24 DIAGNOSIS — Z87891 Personal history of nicotine dependence: Secondary | ICD-10-CM | POA: Diagnosis not present

## 2020-05-24 DIAGNOSIS — R42 Dizziness and giddiness: Secondary | ICD-10-CM | POA: Diagnosis not present

## 2020-05-24 DIAGNOSIS — K5730 Diverticulosis of large intestine without perforation or abscess without bleeding: Secondary | ICD-10-CM | POA: Diagnosis not present

## 2020-05-24 DIAGNOSIS — Z20822 Contact with and (suspected) exposure to covid-19: Secondary | ICD-10-CM | POA: Diagnosis not present

## 2020-05-24 DIAGNOSIS — R079 Chest pain, unspecified: Secondary | ICD-10-CM | POA: Diagnosis not present

## 2020-05-24 DIAGNOSIS — R9431 Abnormal electrocardiogram [ECG] [EKG]: Secondary | ICD-10-CM | POA: Diagnosis not present

## 2020-05-24 DIAGNOSIS — K429 Umbilical hernia without obstruction or gangrene: Secondary | ICD-10-CM | POA: Diagnosis not present

## 2020-05-24 DIAGNOSIS — R110 Nausea: Secondary | ICD-10-CM | POA: Diagnosis not present

## 2020-05-24 DIAGNOSIS — E785 Hyperlipidemia, unspecified: Secondary | ICD-10-CM | POA: Diagnosis not present

## 2020-05-24 DIAGNOSIS — I701 Atherosclerosis of renal artery: Secondary | ICD-10-CM | POA: Diagnosis not present

## 2020-05-31 DIAGNOSIS — R079 Chest pain, unspecified: Secondary | ICD-10-CM | POA: Diagnosis not present

## 2020-05-31 DIAGNOSIS — Z09 Encounter for follow-up examination after completed treatment for conditions other than malignant neoplasm: Secondary | ICD-10-CM | POA: Diagnosis not present

## 2020-05-31 DIAGNOSIS — Z299 Encounter for prophylactic measures, unspecified: Secondary | ICD-10-CM | POA: Diagnosis not present

## 2020-05-31 DIAGNOSIS — J9611 Chronic respiratory failure with hypoxia: Secondary | ICD-10-CM | POA: Diagnosis not present

## 2020-05-31 DIAGNOSIS — I10 Essential (primary) hypertension: Secondary | ICD-10-CM | POA: Diagnosis not present

## 2020-05-31 DIAGNOSIS — R42 Dizziness and giddiness: Secondary | ICD-10-CM | POA: Diagnosis not present

## 2020-06-02 DIAGNOSIS — J61 Pneumoconiosis due to asbestos and other mineral fibers: Secondary | ICD-10-CM | POA: Diagnosis not present

## 2020-06-02 DIAGNOSIS — J449 Chronic obstructive pulmonary disease, unspecified: Secondary | ICD-10-CM | POA: Diagnosis not present

## 2020-06-06 DIAGNOSIS — I8310 Varicose veins of unspecified lower extremity with inflammation: Secondary | ICD-10-CM | POA: Diagnosis not present

## 2020-07-03 DIAGNOSIS — J61 Pneumoconiosis due to asbestos and other mineral fibers: Secondary | ICD-10-CM | POA: Diagnosis not present

## 2020-07-03 DIAGNOSIS — J449 Chronic obstructive pulmonary disease, unspecified: Secondary | ICD-10-CM | POA: Diagnosis not present

## 2020-08-02 DIAGNOSIS — J61 Pneumoconiosis due to asbestos and other mineral fibers: Secondary | ICD-10-CM | POA: Diagnosis not present

## 2020-08-02 DIAGNOSIS — J449 Chronic obstructive pulmonary disease, unspecified: Secondary | ICD-10-CM | POA: Diagnosis not present

## 2020-08-21 DIAGNOSIS — Z8551 Personal history of malignant neoplasm of bladder: Secondary | ICD-10-CM | POA: Diagnosis not present

## 2020-09-02 DIAGNOSIS — J449 Chronic obstructive pulmonary disease, unspecified: Secondary | ICD-10-CM | POA: Diagnosis not present

## 2020-09-02 DIAGNOSIS — J61 Pneumoconiosis due to asbestos and other mineral fibers: Secondary | ICD-10-CM | POA: Diagnosis not present

## 2020-10-02 DIAGNOSIS — L0390 Cellulitis, unspecified: Secondary | ICD-10-CM | POA: Diagnosis not present

## 2020-10-02 DIAGNOSIS — I10 Essential (primary) hypertension: Secondary | ICD-10-CM | POA: Diagnosis not present

## 2020-10-02 DIAGNOSIS — J449 Chronic obstructive pulmonary disease, unspecified: Secondary | ICD-10-CM | POA: Diagnosis not present

## 2020-10-02 DIAGNOSIS — J61 Pneumoconiosis due to asbestos and other mineral fibers: Secondary | ICD-10-CM | POA: Diagnosis not present

## 2020-10-02 DIAGNOSIS — Z299 Encounter for prophylactic measures, unspecified: Secondary | ICD-10-CM | POA: Diagnosis not present

## 2020-10-02 DIAGNOSIS — J9611 Chronic respiratory failure with hypoxia: Secondary | ICD-10-CM | POA: Diagnosis not present

## 2020-10-16 ENCOUNTER — Encounter: Payer: Self-pay | Admitting: Pulmonary Disease

## 2020-10-16 ENCOUNTER — Other Ambulatory Visit: Payer: Self-pay

## 2020-10-16 ENCOUNTER — Ambulatory Visit: Payer: Medicare Other | Admitting: Pulmonary Disease

## 2020-10-16 VITALS — BP 126/82 | HR 100 | Temp 98.4°F | Ht 68.0 in | Wt 174.4 lb

## 2020-10-16 DIAGNOSIS — J441 Chronic obstructive pulmonary disease with (acute) exacerbation: Secondary | ICD-10-CM | POA: Diagnosis not present

## 2020-10-16 DIAGNOSIS — J9621 Acute and chronic respiratory failure with hypoxia: Secondary | ICD-10-CM

## 2020-10-16 DIAGNOSIS — Z23 Encounter for immunization: Secondary | ICD-10-CM | POA: Diagnosis not present

## 2020-10-16 MED ORDER — TRELEGY ELLIPTA 200-62.5-25 MCG/INH IN AEPB: 1.0000 | INHALATION_SPRAY | Freq: Every day | RESPIRATORY_TRACT | 5 refills | Status: DC

## 2020-10-16 MED ORDER — PREDNISONE 20 MG PO TABS: 40.0000 mg | ORAL_TABLET | Freq: Every day | ORAL | 0 refills | Status: AC

## 2020-10-16 NOTE — Patient Instructions (Addendum)
COPD exacerbation --START Prednisone 40 mg x 5 days --INCREASE Trelegy 200-62.5-25 ONE puff ONCE a day --CONTINUE Albuterol AS NEEDED  Bilateral calcified pleural plaques, Asbestosis Exposure, ?27mm pulmonary nodule --Please request CTA from 05/24/20 and mail to disc to our office  Cellulitis --Patient will contact his PCP regarding antibiotic managment

## 2020-10-16 NOTE — Progress Notes (Signed)
Subjective:   PATIENT ID: Timothy [name] GENDER: male DOB: 12/02/1945, MRN: [id number]   HPI  Chief Complaint  Patient presents with  . Follow-up    Using the rescue inhaler 4-5 times per day.  He stated that as long as he doesn't have to move he is ok.  He gets very [facility] with exertion.  The hot and cold temps are bothersome.      Reason for Visit: Follow-up COPD  Timothy [name] is a 74 year old retired male with COPD who presents for follow-up. Son is present.  Since our last visit with NP Volanda Napoleon in April 2021, he reports compliance with his Trelegy. In the last month he has had worsening shortness of breath with exertion. Denies coughing or wheezing. His activity level in general is limited but typically able to ambulate in the house without issue. He recently has noticed his oxygen levels are dropping to 85% and needing albuterol 4-5 times daily and pursed lip breathing. His oxygen previously was 93% on room air. Currently needs 2L with oxygen. Symptoms triggered with exertion and changes in temperature.  Social History: Quit in 2004. 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   I have personally reviewed patient's past medical/family/social history/allergies/current medications. Past Medical History:  Diagnosis Date  . COPD (chronic obstructive pulmonary disease) ([place])   . Essential hypertension   . History of asbestosis 2010  . History of cardiac catheterization 2012   Reportedly normal coronaries  . Respiratory failure with hypoxia (HCC)    Home oxygen     Family History  Problem Relation Age of Onset  . Lung disease Mother        Asbestosis.  . Pancreatic cancer Father   . Breast cancer Sister      Social History   Occupational History  . Not on file  Tobacco Use  . Smoking status: Former Smoker    Packs/day: 1.00    Years: 42.00    Pack years: 42.00    Types: Cigarettes    Start date: 1962    Quit date: 2004    Years since  quitting: 17.9  . Smokeless tobacco: Never Used  Vaping Use  . Vaping Use: Never used  Substance and Sexual Activity  . Alcohol use: Yes    Alcohol/week: 1.0 standard drink    Types: 1 Cans of beer per week    Comment: occas  . Drug use: No  . Sexual activity: Not on file    Allergies  Allergen Reactions  . Codeine Itching     Outpatient Medications Prior to Visit  Medication Sig Dispense Refill  . albuterol (PROVENTIL) (2.5 MG/3ML) 0.083% nebulizer solution Take 3 mLs (2.5 mg total) by nebulization every 6 (six) hours as needed for wheezing or shortness of breath. 75 mL 6  . albuterol (VENTOLIN HFA) 108 (90 Base) MCG/ACT inhaler Inhale 2 puffs into the lungs every 6 (six) hours as needed for wheezing or shortness of breath. 18 g 3  . aspirin 81 MG chewable tablet Chew 1 tablet (81 mg total) by mouth daily. 30 tablet 0  . bisoprolol (ZEBETA) 5 MG tablet Take 0.5 tablets (2.5 mg total) by mouth daily. 45 tablet 0  . Fluticasone-Umeclidin-Vilant (TRELEGY ELLIPTA) 100-62.5-25 MCG/INH AEPB Take 1 puff by mouth daily. 60 each 6  . OXYGEN 2 LPM     No facility-administered medications prior to visit.   Review of Systems  Constitutional: Negative for chills, diaphoresis,  fever, malaise/fatigue and weight loss.  HENT: Negative for congestion, ear pain and sore throat.   Respiratory: Positive for shortness of breath. Negative for cough, hemoptysis, sputum production and wheezing.   Cardiovascular: Negative for chest pain, palpitations and leg swelling.  Gastrointestinal: Negative for abdominal pain, heartburn and nausea.  Genitourinary: Negative for frequency.  Musculoskeletal: Negative for joint pain and myalgias.  Skin: Negative for itching and rash.  Neurological: Negative for dizziness, weakness and headaches.  Endo/Heme/Allergies: Does not bruise/bleed easily.  Psychiatric/Behavioral: Negative for depression. The patient is not nervous/anxious.       Objective:   Vitals:    10/16/20 1504  BP: 126/82  Pulse: 100  Temp: 98.4 F (36.9 C)  TempSrc: Tympanic  SpO2: 93%  Weight: 174 lb 6 oz (79.1 kg)  Height: 5\' 8"  (1.727 m)   SpO2: 93 % (3 liters)  Physical Exam: General: Well-appearing, no acute distress HENT: [place], AT Eyes: EOMI, no scleral icterus Respiratory: Diminished breath sounds bilaterally.  No crackles, wheezing or rales Cardiovascular: RRR, -M/R/G, no JVD Extremities:-Edema,-tenderness Neuro: AAO x4, CNII-XII grossly intact Skin: Mild erythema of the LLL with excoriations Psych: Normal mood, normal affect  Data Reviewed:  Imaging: 11/17/18 CT Chest in PACS reviewed: 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 for COPD management  COPD exacerbation --START Prednisone 40 mg x 5 days --INCREASE Trelegy 200-62.5-25 ONE puff ONCE a day --CONTINUE Albuterol AS NEEDED --Will arrange pulmonary function tests at next visit  Acute on ?chronic hypoxemic respiratory failure --Continue supplemental oxygen for goal SpO2 >88% --Will perform ambulatory O2 at next visit  Bilateral calcified pleural plaques, Asbestosis Exposure, ?41mm pulmonary nodule --Will request CTA from 05/24/20 to review imaging  Cellulitis --Patient will contact his PCP regarding antibiotic managment  Health Maintenance Immunization History  Administered Date(s) Administered  . Fluad Quad(high Dose 65+) 09/15/2019  . Influenza Split 08/16/2013  . Influenza Whole 08/09/2018  . Influenza, High Dose Seasonal PF 08/26/2015  . Influenza,inj,Quad PF,6+ Mos 11/07/2014, 09/22/2017,  08/19/2018  . PFIZER SARS-COV-2 Vaccination 01/24/2020, 02/18/2020  . Pneumococcal Conjugate-13 08/26/2015  . Pneumococcal Polysaccharide-23 08/12/2012    Orders Placed This Encounter  Procedures  . Flu Vaccine QUAD High Dose(Fluad)   Meds ordered this encounter  Medications  . predniSONE (DELTASONE) 20 MG tablet    Sig: Take 2 tablets (40 mg total) by mouth daily with breakfast for 5 days.    Dispense:  10 tablet    Refill:  0    Return in about 1 month (around 11/16/2020).  Between, MD [facility] 10/16/2020 3:32 PM  Office Number [phone]

## 2020-10-23 DIAGNOSIS — I10 Essential (primary) hypertension: Secondary | ICD-10-CM | POA: Diagnosis not present

## 2020-10-23 DIAGNOSIS — C679 Malignant neoplasm of bladder, unspecified: Secondary | ICD-10-CM | POA: Diagnosis not present

## 2020-10-23 DIAGNOSIS — J449 Chronic obstructive pulmonary disease, unspecified: Secondary | ICD-10-CM | POA: Diagnosis not present

## 2020-10-23 DIAGNOSIS — Z299 Encounter for prophylactic measures, unspecified: Secondary | ICD-10-CM | POA: Diagnosis not present

## 2020-10-23 DIAGNOSIS — L03115 Cellulitis of right lower limb: Secondary | ICD-10-CM | POA: Diagnosis not present

## 2020-10-23 DIAGNOSIS — I714 Abdominal aortic aneurysm, without rupture: Secondary | ICD-10-CM | POA: Diagnosis not present

## 2020-11-02 DIAGNOSIS — J61 Pneumoconiosis due to asbestos and other mineral fibers: Secondary | ICD-10-CM | POA: Diagnosis not present

## 2020-11-02 DIAGNOSIS — J449 Chronic obstructive pulmonary disease, unspecified: Secondary | ICD-10-CM | POA: Diagnosis not present

## 2020-11-10 ENCOUNTER — Telehealth: Payer: Self-pay | Admitting: Pulmonary Disease

## 2020-11-10 DIAGNOSIS — J439 Emphysema, unspecified: Secondary | ICD-10-CM

## 2020-11-10 NOTE — Telephone Encounter (Signed)
Tried calling the pt and there was no answer and no VM picked up   I have sent order to [facility] for nasal cannulas

## 2020-11-17 NOTE — Telephone Encounter (Signed)
Called and spoke with pt letting him know that we did place order for him to receive nasal cannulas and he verbalized understanding. Nothing further needed.

## 2020-11-27 DIAGNOSIS — I714 Abdominal aortic aneurysm, without rupture: Secondary | ICD-10-CM | POA: Diagnosis not present

## 2020-11-27 DIAGNOSIS — J441 Chronic obstructive pulmonary disease with (acute) exacerbation: Secondary | ICD-10-CM | POA: Diagnosis not present

## 2020-11-27 DIAGNOSIS — I700 Atherosclerosis of aorta: Secondary | ICD-10-CM | POA: Diagnosis not present

## 2020-11-27 DIAGNOSIS — I10 Essential (primary) hypertension: Secondary | ICD-10-CM | POA: Diagnosis not present

## 2020-11-27 DIAGNOSIS — Z299 Encounter for prophylactic measures, unspecified: Secondary | ICD-10-CM | POA: Diagnosis not present

## 2020-12-03 DIAGNOSIS — J449 Chronic obstructive pulmonary disease, unspecified: Secondary | ICD-10-CM | POA: Diagnosis not present

## 2020-12-03 DIAGNOSIS — J61 Pneumoconiosis due to asbestos and other mineral fibers: Secondary | ICD-10-CM | POA: Diagnosis not present

## 2020-12-31 DIAGNOSIS — J61 Pneumoconiosis due to asbestos and other mineral fibers: Secondary | ICD-10-CM | POA: Diagnosis not present

## 2020-12-31 DIAGNOSIS — J449 Chronic obstructive pulmonary disease, unspecified: Secondary | ICD-10-CM | POA: Diagnosis not present

## 2021-01-22 ENCOUNTER — Telehealth: Payer: Self-pay | Admitting: Pulmonary Disease

## 2021-01-22 NOTE — Telephone Encounter (Signed)
disregard

## 2021-01-23 NOTE — Telephone Encounter (Signed)
ATC pt. VM box has not been set up yet. WCB.   Pt's most recent imaging is CXR from 2019. Pt doesn't have MRI in chart that I can find.

## 2021-01-23 NOTE — Telephone Encounter (Signed)
Called and spoke with patient. He was requesting to have a copy of his CT chest and CXR that were done last year to be faxed to his lawyers office. He stated that he had the exams done at [facility]. He had tried calling over to the medical records department in [place] but they would not help him. I advised him that he does need a signed medical release but since he lives in [place], I would document that he has given me permission to fax these to his lawyer. He did so.   He is aware that I will fax the report to Levada Dy at [phone].   Nothing further needed at time of call.

## 2021-01-31 DIAGNOSIS — J61 Pneumoconiosis due to asbestos and other mineral fibers: Secondary | ICD-10-CM | POA: Diagnosis not present

## 2021-01-31 DIAGNOSIS — J449 Chronic obstructive pulmonary disease, unspecified: Secondary | ICD-10-CM | POA: Diagnosis not present

## 2021-03-02 DIAGNOSIS — J449 Chronic obstructive pulmonary disease, unspecified: Secondary | ICD-10-CM | POA: Diagnosis not present

## 2021-03-02 DIAGNOSIS — J61 Pneumoconiosis due to asbestos and other mineral fibers: Secondary | ICD-10-CM | POA: Diagnosis not present

## 2021-04-02 DIAGNOSIS — J449 Chronic obstructive pulmonary disease, unspecified: Secondary | ICD-10-CM | POA: Diagnosis not present

## 2021-04-02 DIAGNOSIS — J61 Pneumoconiosis due to asbestos and other mineral fibers: Secondary | ICD-10-CM | POA: Diagnosis not present

## 2021-05-02 DIAGNOSIS — J61 Pneumoconiosis due to asbestos and other mineral fibers: Secondary | ICD-10-CM | POA: Diagnosis not present

## 2021-05-02 DIAGNOSIS — J449 Chronic obstructive pulmonary disease, unspecified: Secondary | ICD-10-CM | POA: Diagnosis not present

## 2021-05-14 DIAGNOSIS — Z7189 Other specified counseling: Secondary | ICD-10-CM | POA: Diagnosis not present

## 2021-05-14 DIAGNOSIS — Z87891 Personal history of nicotine dependence: Secondary | ICD-10-CM | POA: Diagnosis not present

## 2021-05-14 DIAGNOSIS — Z0000 Encounter for general adult medical examination without abnormal findings: Secondary | ICD-10-CM | POA: Diagnosis not present

## 2021-05-14 DIAGNOSIS — I10 Essential (primary) hypertension: Secondary | ICD-10-CM | POA: Diagnosis not present

## 2021-05-14 DIAGNOSIS — R5383 Other fatigue: Secondary | ICD-10-CM | POA: Diagnosis not present

## 2021-05-14 DIAGNOSIS — Z299 Encounter for prophylactic measures, unspecified: Secondary | ICD-10-CM | POA: Diagnosis not present

## 2021-05-14 DIAGNOSIS — Z79899 Other long term (current) drug therapy: Secondary | ICD-10-CM | POA: Diagnosis not present

## 2021-05-28 ENCOUNTER — Other Ambulatory Visit: Payer: Self-pay | Admitting: Pulmonary Disease

## 2021-05-28 DIAGNOSIS — J441 Chronic obstructive pulmonary disease with (acute) exacerbation: Secondary | ICD-10-CM

## 2021-06-02 DIAGNOSIS — J61 Pneumoconiosis due to asbestos and other mineral fibers: Secondary | ICD-10-CM | POA: Diagnosis not present

## 2021-06-02 DIAGNOSIS — J449 Chronic obstructive pulmonary disease, unspecified: Secondary | ICD-10-CM | POA: Diagnosis not present

## 2021-06-18 ENCOUNTER — Other Ambulatory Visit: Payer: Self-pay

## 2021-06-18 ENCOUNTER — Encounter: Payer: Self-pay | Admitting: Pulmonary Disease

## 2021-06-18 ENCOUNTER — Ambulatory Visit: Payer: Medicare Other | Admitting: Pulmonary Disease

## 2021-06-18 DIAGNOSIS — J441 Chronic obstructive pulmonary disease with (acute) exacerbation: Secondary | ICD-10-CM | POA: Diagnosis not present

## 2021-06-18 MED ORDER — ALBUTEROL SULFATE HFA 108 (90 BASE) MCG/ACT IN AERS: 2.0000 | INHALATION_SPRAY | Freq: Four times a day (QID) | RESPIRATORY_TRACT | 3 refills | Status: DC | PRN

## 2021-06-18 MED ORDER — TRELEGY ELLIPTA 200-62.5-25 MCG/INH IN AEPB: INHALATION_SPRAY | RESPIRATORY_TRACT | 6 refills | Status: DC

## 2021-06-18 NOTE — Patient Instructions (Signed)
COPD  --CONTINUE Trelegy 200-62.5-25 ONE puff ONCE a day. REFILLED --CONTINUE Albuterol AS NEEDED --ARRANGE pulmonary function tests at next visit  Chronic hypoxemic respiratory failure --Continue supplemental oxygen for goal SpO2 >88% --Will perform ambulatory O2 at next visit  Bilateral calcified pleural plaques, Asbestosis Exposure, ?39m pulmonary nodule --[facility] without contrast  Follow-up with me in 3 months

## 2021-06-18 NOTE — Progress Notes (Signed)
Subjective:   PATIENT ID: Timothy [name] GENDER: male DOB: 11/20/1945, MRN: [id number]   HPI  Chief Complaint  Patient presents with   Follow-up    Pt states he have concerns his breathing is getting worst have questions of why. Medications are not helping    Reason for Visit: Follow-up COPD  Timothy [name] is a 75 year old retired male with COPD who presents for follow-up.  06/18/21 He is compliant with Trelegy. Denies coughing or wheezing. Shortness of breath with exertion so activity remains limited but able to ambulate within house and perform daily tasks. He is compliant with his oxygen.  He will uses the albuterol three times a week, 2-3 puffs which is improved compared to our last visit. Symptoms usually triggered with exertion or changes in weather. He reports occasional dizziness and unsure if this is related to his blood pressure medications.    Social History: Quit in 2004. 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   Past Medical History:  Diagnosis Date   COPD (chronic obstructive pulmonary disease) ([place])    Essential hypertension    History of asbestosis 2010   History of cardiac catheterization 2012   Reportedly normal coronaries   Respiratory failure with hypoxia (HCC)    Home oxygen     Family History  Problem Relation Age of Onset   Lung disease Mother        Asbestosis.   Pancreatic cancer Father    Breast cancer Sister      Social History   Occupational History   Not on file  Tobacco Use   Smoking status: Former    Packs/day: 1.00    Years: 42.00    Pack years: 42.00    Types: Cigarettes    Start date: 1962    Quit date: 2004    Years since quitting: 18.6   Smokeless tobacco: Never  Vaping Use   Vaping Use: Never used  Substance and Sexual Activity   Alcohol use: Yes    Alcohol/week: 1.0 standard drink    Types: 1 Cans of beer per week    Comment: occas   Drug use: No   Sexual activity: Not on file     Allergies  Allergen Reactions   Codeine Itching     Outpatient Medications Prior to Visit  Medication Sig Dispense Refill   albuterol (PROVENTIL) (2.5 MG/3ML) 0.083% nebulizer solution Take 3 mLs (2.5 mg total) by nebulization every 6 (six) hours as needed for wheezing or shortness of breath. 75 mL 6   albuterol (VENTOLIN HFA) 108 (90 Base) MCG/ACT inhaler Inhale 2 puffs into the lungs every 6 (six) hours as needed for wheezing or shortness of breath. 18 g 3   aspirin 81 MG chewable tablet Chew 1 tablet (81 mg total) by mouth daily. 30 tablet 0   bisoprolol (ZEBETA) 5 MG tablet Take 0.5 tablets (2.5 mg total) by mouth daily. 45 tablet 0   OXYGEN 2 LPM     TRELEGY ELLIPTA 200-62.5-25 MCG/INH AEPB Inhale 1 puff by mouth once daily 60 each 0   No facility-administered medications prior to visit.   Review of Systems  Constitutional:  Negative for chills, diaphoresis, fever, malaise/fatigue and weight loss.  HENT:  Negative for congestion.   Respiratory:  Negative for cough, hemoptysis, sputum production, shortness of breath and wheezing.   Cardiovascular:  Negative for chest pain, palpitations and leg swelling.     Objective:   Vitals:  06/18/21 1546  BP: (!) 150/80  Pulse: 88  Temp: 97.9 F (36.6 C)  TempSrc: Oral  SpO2: 97%  Weight: 172 lb 9.6 oz (78.3 kg)  Height: '5\' 8"'$  (1.727 m)      Physical Exam: General: Well-appearing, no acute distress HENT: [place], AT Eyes: EOMI, no scleral icterus Respiratory: Clear to auscultation bilaterally.  No crackles, wheezing or rales Cardiovascular: RRR, -M/R/G, no JVD Extremities:-Edema,-tenderness Neuro: AAO x4, CNII-XII grossly intact Psych: Normal mood, normal affect   Data Reviewed:  Imaging: 11/17/18 CT Chest in PACS reviewed: 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 Unable to visualize 45m pulmonary nodule in RLL mentioned in report due to architectural distortion CTA 05/24/20 (report only) -  Calcified pleural plaques bilaterally.  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 No recent exacerbations. Discussed bronchodilator management and exacerbation plan.  COPD  --CONTINUE Trelegy 200-62.5-25 ONE puff ONCE a day. REFILLED --CONTINUE Albuterol AS NEEDED --ARRANGE pulmonary function tests at next visit  Acute on ?chronic hypoxemic respiratory failure --Continue supplemental oxygen for goal SpO2 >88% --Will perform ambulatory O2 at next visit  Bilateral calcified pleural plaques, Asbestosis Exposure, ?376mpulmonary nodule --Will request CTA from 05/24/20 to review imaging --ORDER CT Chest without contrast   Health Maintenance Immunization History  Administered Date(s) Administered   Fluad Quad(high Dose 65+) 09/15/2019, 10/16/2020   Influenza Split 08/16/2013   Influenza Whole 08/09/2018   Influenza, High Dose Seasonal PF 08/26/2015   Influenza,inj,Quad PF,6+ Mos 11/07/2014, 09/22/2017, 08/19/2018   PFIZER(Purple Top)SARS-COV-2 Vaccination 01/24/2020, 02/18/2020   Pneumococcal Conjugate-13 08/26/2015   Pneumococcal Polysaccharide-23 08/12/2012    Orders Placed This Encounter  Procedures   CT Chest Wo Contrast    Standing Status:   Future    Standing Expiration Date:   06/18/2022    Scheduling Instructions:     Done next 28m48mo Order Specific Question:   [facility]?    [facility]    Standing Status:   Future    Standing Expiration Date:   06/18/2022    Order Specific Question:   Where should this test be performed?    Answer:   [facility]    Order Specific Question:   Full PFT: includes the following: basic spirometry, spirometry pre & post bronchodilator, diffusion capacity (DLCO), lung volumes    Answer:   Full PFT   Meds  ordered this encounter  Medications   Fluticasone-Umeclidin-Vilant (TRELEGY ELLIPTA) 200-62.5-25 MCG/INH AEPB    Sig: Inhale 1 puff by mouth once daily    Dispense:  60 each    Refill:  6    Must keep next Appointment   albuterol (VENTOLIN HFA) 108 (90 Base) MCG/ACT inhaler    Sig: Inhale 2 puffs into the lungs every 6 (six) hours as needed for wheezing or shortness of breath.    Dispense:  18 g    Refill:  3    Return in about 3 months (around 09/18/2021).  Klea Nall JanRodman PickleD [facility] 06/18/2021 1:20 PM  Office Number [phone]

## 2021-06-27 DIAGNOSIS — R0902 Hypoxemia: Secondary | ICD-10-CM | POA: Diagnosis not present

## 2021-06-27 DIAGNOSIS — L0390 Cellulitis, unspecified: Secondary | ICD-10-CM | POA: Diagnosis not present

## 2021-06-27 DIAGNOSIS — J449 Chronic obstructive pulmonary disease, unspecified: Secondary | ICD-10-CM | POA: Diagnosis not present

## 2021-06-27 DIAGNOSIS — Z299 Encounter for prophylactic measures, unspecified: Secondary | ICD-10-CM | POA: Diagnosis not present

## 2021-06-27 DIAGNOSIS — L309 Dermatitis, unspecified: Secondary | ICD-10-CM | POA: Diagnosis not present

## 2021-06-27 DIAGNOSIS — I10 Essential (primary) hypertension: Secondary | ICD-10-CM | POA: Diagnosis not present

## 2021-07-03 DIAGNOSIS — J449 Chronic obstructive pulmonary disease, unspecified: Secondary | ICD-10-CM | POA: Diagnosis not present

## 2021-07-03 DIAGNOSIS — J61 Pneumoconiosis due to asbestos and other mineral fibers: Secondary | ICD-10-CM | POA: Diagnosis not present

## 2021-07-17 DIAGNOSIS — D692 Other nonthrombocytopenic purpura: Secondary | ICD-10-CM | POA: Diagnosis not present

## 2021-07-17 DIAGNOSIS — I10 Essential (primary) hypertension: Secondary | ICD-10-CM | POA: Diagnosis not present

## 2021-07-17 DIAGNOSIS — Z299 Encounter for prophylactic measures, unspecified: Secondary | ICD-10-CM | POA: Diagnosis not present

## 2021-07-17 DIAGNOSIS — I714 Abdominal aortic aneurysm, without rupture: Secondary | ICD-10-CM | POA: Diagnosis not present

## 2021-07-17 DIAGNOSIS — I700 Atherosclerosis of aorta: Secondary | ICD-10-CM | POA: Diagnosis not present

## 2021-07-17 DIAGNOSIS — Z23 Encounter for immunization: Secondary | ICD-10-CM | POA: Diagnosis not present

## 2021-08-02 DIAGNOSIS — J61 Pneumoconiosis due to asbestos and other mineral fibers: Secondary | ICD-10-CM | POA: Diagnosis not present

## 2021-08-02 DIAGNOSIS — J449 Chronic obstructive pulmonary disease, unspecified: Secondary | ICD-10-CM | POA: Diagnosis not present

## 2021-08-13 ENCOUNTER — Telehealth: Payer: Self-pay | Admitting: Pulmonary Disease

## 2021-08-13 NOTE — Telephone Encounter (Signed)
Pt is UNHAPPY that no one has contacted him regarding his CT scan. I did inform him that the [facility] are working as fast as they can and that he should be getting a call shortly. He stated "the only person I want to hear from is Dr.Ellison" and hung up. Routing to PCCs to make aware of the situation.

## 2021-08-14 NOTE — Telephone Encounter (Addendum)
I had pt's CT order and scheduled it for 11/2 at 3:30 at [facility].  I spoke to pt & gave him appt info.  Nothing further needed.

## 2021-08-29 ENCOUNTER — Ambulatory Visit (HOSPITAL_COMMUNITY)
Admission: RE | Admit: 2021-08-29 | Discharge: 2021-08-29 | Disposition: A | Discharge status: Home / Self Care | Payer: Medicare Other | Source: Ambulatory Visit | Attending: Pulmonary Disease | Admitting: Pulmonary Disease

## 2021-08-29 ENCOUNTER — Other Ambulatory Visit: Payer: Self-pay

## 2021-08-29 DIAGNOSIS — I700 Atherosclerosis of aorta: Secondary | ICD-10-CM | POA: Diagnosis not present

## 2021-08-29 DIAGNOSIS — J449 Chronic obstructive pulmonary disease, unspecified: Secondary | ICD-10-CM | POA: Diagnosis not present

## 2021-08-29 DIAGNOSIS — R911 Solitary pulmonary nodule: Secondary | ICD-10-CM | POA: Diagnosis not present

## 2021-08-29 DIAGNOSIS — J9811 Atelectasis: Secondary | ICD-10-CM | POA: Diagnosis not present

## 2021-08-29 DIAGNOSIS — J439 Emphysema, unspecified: Secondary | ICD-10-CM | POA: Diagnosis not present

## 2021-08-29 DIAGNOSIS — J441 Chronic obstructive pulmonary disease with (acute) exacerbation: Secondary | ICD-10-CM | POA: Insufficient documentation

## 2021-08-29 DIAGNOSIS — R0602 Shortness of breath: Secondary | ICD-10-CM | POA: Diagnosis not present

## 2021-08-31 ENCOUNTER — Telehealth: Payer: Self-pay | Admitting: Pulmonary Disease

## 2021-08-31 NOTE — Telephone Encounter (Signed)
Patient is aware of results and voiced his understanding.  Nothing further needed.  

## 2021-08-31 NOTE — Telephone Encounter (Signed)
CT Chest 08/29/21 reviewed. Unchanged pleural plaques. We can review further on our next visit later this month. Please call to update patient with findings.

## 2021-09-02 DIAGNOSIS — J61 Pneumoconiosis due to asbestos and other mineral fibers: Secondary | ICD-10-CM | POA: Diagnosis not present

## 2021-09-02 DIAGNOSIS — J449 Chronic obstructive pulmonary disease, unspecified: Secondary | ICD-10-CM | POA: Diagnosis not present

## 2021-09-10 ENCOUNTER — Ambulatory Visit: Payer: Medicare Other | Admitting: Pulmonary Disease

## 2021-09-26 ENCOUNTER — Ambulatory Visit: Payer: Medicare Other | Admitting: Pulmonary Disease

## 2021-10-02 DIAGNOSIS — J449 Chronic obstructive pulmonary disease, unspecified: Secondary | ICD-10-CM | POA: Diagnosis not present

## 2021-10-02 DIAGNOSIS — J61 Pneumoconiosis due to asbestos and other mineral fibers: Secondary | ICD-10-CM | POA: Diagnosis not present

## 2021-10-26 DIAGNOSIS — J61 Pneumoconiosis due to asbestos and other mineral fibers: Secondary | ICD-10-CM | POA: Diagnosis not present

## 2021-10-26 DIAGNOSIS — J449 Chronic obstructive pulmonary disease, unspecified: Secondary | ICD-10-CM | POA: Diagnosis not present

## 2021-10-26 DIAGNOSIS — N183 Chronic kidney disease, stage 3 unspecified: Secondary | ICD-10-CM | POA: Diagnosis not present

## 2021-11-02 DIAGNOSIS — J61 Pneumoconiosis due to asbestos and other mineral fibers: Secondary | ICD-10-CM | POA: Diagnosis not present

## 2021-11-02 DIAGNOSIS — J449 Chronic obstructive pulmonary disease, unspecified: Secondary | ICD-10-CM | POA: Diagnosis not present

## 2021-11-07 DIAGNOSIS — Z299 Encounter for prophylactic measures, unspecified: Secondary | ICD-10-CM | POA: Diagnosis not present

## 2021-11-07 DIAGNOSIS — I10 Essential (primary) hypertension: Secondary | ICD-10-CM | POA: Diagnosis not present

## 2021-11-07 DIAGNOSIS — I700 Atherosclerosis of aorta: Secondary | ICD-10-CM | POA: Diagnosis not present

## 2021-11-07 DIAGNOSIS — C679 Malignant neoplasm of bladder, unspecified: Secondary | ICD-10-CM | POA: Diagnosis not present

## 2021-11-07 DIAGNOSIS — J449 Chronic obstructive pulmonary disease, unspecified: Secondary | ICD-10-CM | POA: Diagnosis not present

## 2021-11-16 DIAGNOSIS — Z885 Allergy status to narcotic agent status: Secondary | ICD-10-CM | POA: Diagnosis not present

## 2021-11-16 DIAGNOSIS — S39012A Strain of muscle, fascia and tendon of lower back, initial encounter: Secondary | ICD-10-CM | POA: Diagnosis not present

## 2021-11-16 DIAGNOSIS — R0600 Dyspnea, unspecified: Secondary | ICD-10-CM | POA: Diagnosis not present

## 2021-11-16 DIAGNOSIS — J449 Chronic obstructive pulmonary disease, unspecified: Secondary | ICD-10-CM | POA: Diagnosis not present

## 2021-11-16 DIAGNOSIS — X58XXXA Exposure to other specified factors, initial encounter: Secondary | ICD-10-CM | POA: Diagnosis not present

## 2021-11-16 DIAGNOSIS — J439 Emphysema, unspecified: Secondary | ICD-10-CM | POA: Diagnosis not present

## 2021-11-16 DIAGNOSIS — R109 Unspecified abdominal pain: Secondary | ICD-10-CM | POA: Diagnosis not present

## 2021-11-16 DIAGNOSIS — I700 Atherosclerosis of aorta: Secondary | ICD-10-CM | POA: Diagnosis not present

## 2021-11-16 DIAGNOSIS — Z79899 Other long term (current) drug therapy: Secondary | ICD-10-CM | POA: Diagnosis not present

## 2021-11-16 DIAGNOSIS — E785 Hyperlipidemia, unspecified: Secondary | ICD-10-CM | POA: Diagnosis not present

## 2021-11-16 DIAGNOSIS — G8929 Other chronic pain: Secondary | ICD-10-CM | POA: Diagnosis not present

## 2021-11-16 DIAGNOSIS — Z9981 Dependence on supplemental oxygen: Secondary | ICD-10-CM | POA: Diagnosis not present

## 2021-11-19 ENCOUNTER — Encounter: Payer: Self-pay | Admitting: Pulmonary Disease

## 2021-11-19 ENCOUNTER — Telehealth: Payer: Self-pay | Admitting: Pulmonary Disease

## 2021-11-19 ENCOUNTER — Other Ambulatory Visit: Payer: Self-pay

## 2021-11-19 ENCOUNTER — Ambulatory Visit: Payer: Medicare Other | Admitting: Pulmonary Disease

## 2021-11-19 VITALS — BP 150/84 | HR 87 | Temp 97.7°F | Ht 68.0 in | Wt 173.0 lb

## 2021-11-19 DIAGNOSIS — J9611 Chronic respiratory failure with hypoxia: Secondary | ICD-10-CM | POA: Diagnosis not present

## 2021-11-19 DIAGNOSIS — J439 Emphysema, unspecified: Secondary | ICD-10-CM

## 2021-11-19 MED ORDER — BREZTRI AEROSPHERE 160-9-4.8 MCG/ACT IN AERO: 2.0000 | INHALATION_SPRAY | Freq: Two times a day (BID) | RESPIRATORY_TRACT | 2 refills | Status: DC

## 2021-11-19 NOTE — Patient Instructions (Signed)
Severe COPD  --STOP Trelegy --START Breztri TWO puffs TWICE a day --CONTINUE Albuterol AS NEEDED  Acute on ?chronic hypoxemic respiratory failure --Continue supplemental pulsed oxygen for goal SpO2 >88% --Ambulatory O2 shows 2L with exertion and sleep  Bilateral calcified pleural plaques, Asbestosis Exposure,  42mm pulmonary nodule - resolved --No further imaging indicated  Follow-up with me in 2 months

## 2021-11-19 NOTE — Progress Notes (Signed)
Subjective:   PATIENT ID: Timothy [name], Timothy [name]   HPI  Chief Complaint  Patient presents with   Follow-up    Copd     Reason for Visit: Follow-up COPD  [name] is a 76 year old retired male with COPD who presents for follow-up.  06/18/21 He is compliant with Trelegy. Denies coughing or wheezing. Shortness of breath with exertion so activity remains limited but able to ambulate within house and perform daily tasks. He is compliant with his oxygen.  He will uses the albuterol three times a week, 2-3 puffs which is improved compared to our last visit. Symptoms usually triggered with exertion or changes in weather. He reports occasional dizziness and unsure if this is related to his blood pressure medications.   11/19/21 Since his last visit he reports worsening shortness of breath. He is starting to wheezing with exertion. Occasional cough sometimes with sputum production. He is also having worsening back and joint pain. Had to go to the ED on 1/20 and treated with pain medication. He uses his albuterol twice a day. Has been compliant with Trelegy and feels this is not working. He is compliant with his oxygen and on 2L. Although symptomatic, denies recent exacerbations requiring steroids or antibiotics in the last 5 months.  Social History: Quit in 2004. 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   Past Medical History:  Diagnosis Date   COPD (chronic obstructive pulmonary disease) ([place])    Essential hypertension    History of asbestosis 2010   History of cardiac catheterization 2012   Reportedly normal coronaries   Respiratory failure with hypoxia (HCC)    Home oxygen     Family History  Problem Relation Age of Onset   Lung disease Mother        Asbestosis.   Pancreatic cancer Father    Breast cancer Sister      Social History   Occupational History   Not on file  Tobacco Use   Smoking status:  Former    Packs/day: 1.00    Years: 42.00    Pack years: 42.00    Types: Cigarettes    Start date: 1962    Quit date: 2004    Years since quitting: 19.0   Smokeless tobacco: Never  Vaping Use   Vaping Use: Never used  Substance and Sexual Activity   Alcohol use: Yes    Alcohol/week: 1.0 standard drink    Types: 1 Cans of beer per week    Comment: occas   Drug use: No   Sexual activity: Not on file    Allergies  Allergen Reactions   Codeine Itching     Outpatient Medications Prior to Visit  Medication Sig Dispense Refill   albuterol (VENTOLIN HFA) 108 (90 Base) MCG/ACT inhaler Inhale 2 puffs into the lungs every 6 (six) hours as needed for wheezing or shortness of breath. 18 g 3   aspirin 81 MG chewable tablet Chew 1 tablet (81 mg total) by mouth daily. 30 tablet 0   lisinopril (ZESTRIL) 10 MG tablet Take 10 mg by mouth daily.     OXYGEN 2 LPM     Fluticasone-Umeclidin-Vilant (TRELEGY ELLIPTA) 200-62.5-25 MCG/INH AEPB Inhale 1 puff by mouth once daily (Patient not taking: Reported on 11/19/2021) 60 each 6   No facility-administered medications prior to visit.   Review of Systems  Constitutional:  Negative for chills, diaphoresis, fever, malaise/fatigue and  weight loss.  HENT:  Negative for congestion.   Respiratory:  Positive for cough, shortness of breath and wheezing. Negative for hemoptysis and sputum production.   Cardiovascular:  Negative for chest pain, palpitations and leg swelling.   Objective:   Vitals:   11/19/21 1433 11/19/21 1536  BP: (!) 166/104 (!) 150/84  Pulse: 87   Temp: 97.7 F (36.5 C)   TempSrc: Oral   SpO2: 90%   Weight: 173 lb (78.5 kg)   Height: 5\' 8"  (1.727 m)    SpO2: 90 % O2 Device: Nasal cannula O2 Flow Rate (L/min): 2 L/min O2 Type: Pulse O2  Physical Exam: General: Well-appearing, no acute distress HENT: [place], AT Eyes: EOMI, no scleral icterus Respiratory: Diminished breath sounds bilaterally.  No crackles, wheezing or  rales Cardiovascular: RRR, -M/R/G, no JVD Extremities:-Edema,-tenderness Neuro: AAO x4, CNII-XII grossly intact Psych: Normal mood, normal affect  Data Reviewed:  Imaging: 11/17/18 CT Chest in PACS reviewed: 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 Unable to visualize 35mm pulmonary nodule in RLL mentioned in report due to architectural distortion CTA 05/24/20 (report only) - Calcified pleural plaques bilaterally. 08/29/21 - CT Chest - Calcified pleural plaques. Bullous emphysema. 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 No exacerbations in >1 year. We discussed clinical course of COPD, bronchodilator management and early treatment of exacerbations.  Trelegy - failed to control symptoms despite compliance.   Severe COPD with bullous emphysema - uncontrolled --STOP Trelegy --START Breztri TWO puffs TWICE a day --CONTINUE Albuterol AS NEEDED --Discuss pulmonary rehab at the next visit  Chronic hypoxemic respiratory failure --Continue supplemental oxygen for goal SpO2 >88% --Ambulatory O2 demonstrates need for 2L with exertion and sleep  Bilateral calcified pleural plaques, Asbestosis Exposure,  2mm pulmonary nodule - resolved --Reviewed CT from 08/2021 --No further imaging indicated   Health Maintenance Immunization History  Administered Date(s) Administered   Fluad Quad(high Dose 65+) 09/15/2019, 10/16/2020   Influenza Split 08/16/2013   Influenza Whole 08/09/2018   Influenza, High Dose Seasonal PF 08/26/2015   Influenza,inj,Quad PF,6+ Mos 11/07/2014, 09/22/2017, 08/19/2018   PFIZER(Purple Top)SARS-COV-2 Vaccination 01/24/2020, 02/18/2020   Pneumococcal Conjugate-13 08/26/2015   Pneumococcal Polysaccharide-23 08/12/2012    No orders of the  defined types were placed in this encounter.  Meds ordered this encounter  Medications   Budeson-Glycopyrrol-Formoterol (BREZTRI AEROSPHERE) 160-9-4.8 MCG/ACT AERO    Sig: Inhale 2 puffs into the lungs in the morning and at bedtime.    Dispense:  10.7 g    Refill:  2   Return in about 2 months (around 01/17/2022).  I have spent a total time of 33-minutes on the day of the appointment reviewing prior documentation, coordinating care and discussing medical diagnosis and plan with the patient/family. Past medical history, allergies, medications were reviewed. Pertinent imaging, labs and tests included in this note have been reviewed and interpreted independently by me.  [place], MD [facility] 11/19/2021 6:55 PM  Office Number [phone]

## 2021-11-20 ENCOUNTER — Other Ambulatory Visit (HOSPITAL_COMMUNITY): Payer: Self-pay

## 2021-11-20 NOTE — Telephone Encounter (Signed)
Called patient and he stated that Judithann Sauger was too expensive for him to afford and needed something else ordered

## 2021-11-20 NOTE — Telephone Encounter (Signed)
Patient has Medicare. Prior auth team will run test claims, but if all inhalers are too expensive, patient will have to pursue patient assistance to receive inhaler at no cost directly from manufacturer. Clinic will have to manage patient assistance if this is best option for patient after test claims are run.   Prior auth team: please run test claims for Timothy [name], Timothy [name], Bevespi  If Timothy [name] is inhaler of choice, patient will have to pursue patient assistance through [place]   Please note, [place] team is appropriate pool for inhaler questions.   Timothy [name], PharmD, MPH, BCPS Clinical Pharmacist (Rheumatology and Pulmonology)

## 2021-11-21 NOTE — Telephone Encounter (Signed)
Timothy [name], April L, RN; Cassandria Anger, [place] 23 hours ago (1:37 PM)   They are all $47 copays     Called and spoke with pt letting him know that the inhalers that are preferred with insurance were all $47 copays. Pt said that he called insurance company and it seems like there is an issue with his insurance as the new insurance card that he just received has more numbers on it than his prior card did. After pt received this, he called pharmacy and they told him that they were not sure which number/card they wanted to use and told him to bring the new card to the pharmacy for them to look at and they would then go from there to see what they wanted to do.  Stated to pt after he went to the pharmacy, if the price of the Judithann Sauger was still too expensive to call us to let us know and we could further discuss with JE to see what she wanted to do. While speaking with pt, pt stated that JE had mentioned to him at appt that she would send in neb sol to pharmacy for pt to have. Asked pt if JE had mentioned switching him to neb sol all together or having this as a back up and he stated that he thought that it was for him to have the sol as a back up to the inhaler but he was not sure.  Dr. Loanne Drilling, please advise on this.

## 2021-11-22 DIAGNOSIS — Z299 Encounter for prophylactic measures, unspecified: Secondary | ICD-10-CM | POA: Diagnosis not present

## 2021-11-22 DIAGNOSIS — C679 Malignant neoplasm of bladder, unspecified: Secondary | ICD-10-CM | POA: Diagnosis not present

## 2021-11-22 DIAGNOSIS — I700 Atherosclerosis of aorta: Secondary | ICD-10-CM | POA: Diagnosis not present

## 2021-11-22 DIAGNOSIS — M62830 Muscle spasm of back: Secondary | ICD-10-CM | POA: Diagnosis not present

## 2021-11-22 DIAGNOSIS — Z87891 Personal history of nicotine dependence: Secondary | ICD-10-CM | POA: Diagnosis not present

## 2021-11-22 DIAGNOSIS — I10 Essential (primary) hypertension: Secondary | ICD-10-CM | POA: Diagnosis not present

## 2021-11-23 ENCOUNTER — Other Ambulatory Visit (HOSPITAL_COMMUNITY): Payer: Self-pay

## 2021-11-23 MED ORDER — IPRATROPIUM-ALBUTEROL 0.5-2.5 (3) MG/3ML IN SOLN: 3.0000 mL | Freq: Four times a day (QID) | RESPIRATORY_TRACT | 5 refills | Status: DC | PRN

## 2021-11-23 MED ORDER — BREZTRI AEROSPHERE 160-9-4.8 MCG/ACT IN AERO: 2.0000 | INHALATION_SPRAY | Freq: Two times a day (BID) | RESPIRATORY_TRACT | 5 refills | Status: DC

## 2021-11-23 MED ORDER — TRELEGY ELLIPTA 200-62.5-25 MCG/ACT IN AEPB: 1.0000 | INHALATION_SPRAY | Freq: Every day | RESPIRATORY_TRACT | 5 refills | Status: DC

## 2021-11-23 NOTE — Telephone Encounter (Signed)
Called and spoke with the patient to see if he wanted me to mail him patient assistance paperwork for [facility] or Trelegy. He states that he has called his insurance company and they are working with him on patient assistance. He said that he will call us back Monday or Tuesday once he has more information. Will wait to hear from patient

## 2021-11-23 NOTE — Telephone Encounter (Signed)
Please let patient know that until he meets his deductible that his inhaler cost will likely be high. I have discontinued his order for [facility] and ordered Trelegy but he may have the same cost issue until he meets his deductible. If we have any options for patient assistance for either inhaler Judithann Sauger or Trelegy), please offer it to him.

## 2021-11-23 NOTE — Telephone Encounter (Signed)
Discussed with patient regarding cost of inhaler. He has spoken with his insurance who states the inhaler is $47 however [facility] is asking for payment of ~$600 despite him giving updated insurance information. We will send RX Breztri to [facility].  I have also ordered Duonebs q6h PRN at patient's request.

## 2021-11-25 DIAGNOSIS — Z9981 Dependence on supplemental oxygen: Secondary | ICD-10-CM | POA: Diagnosis not present

## 2021-11-25 DIAGNOSIS — J439 Emphysema, unspecified: Secondary | ICD-10-CM | POA: Diagnosis not present

## 2021-11-25 DIAGNOSIS — R079 Chest pain, unspecified: Secondary | ICD-10-CM | POA: Diagnosis not present

## 2021-11-25 DIAGNOSIS — S32038A Other fracture of third lumbar vertebra, initial encounter for closed fracture: Secondary | ICD-10-CM | POA: Diagnosis not present

## 2021-11-25 DIAGNOSIS — S32048A Other fracture of fourth lumbar vertebra, initial encounter for closed fracture: Secondary | ICD-10-CM | POA: Diagnosis not present

## 2021-11-25 DIAGNOSIS — R9431 Abnormal electrocardiogram [ECG] [EKG]: Secondary | ICD-10-CM | POA: Diagnosis not present

## 2021-11-25 DIAGNOSIS — M4856XA Collapsed vertebra, not elsewhere classified, lumbar region, initial encounter for fracture: Secondary | ICD-10-CM | POA: Diagnosis not present

## 2021-11-25 DIAGNOSIS — R3129 Other microscopic hematuria: Secondary | ICD-10-CM | POA: Diagnosis not present

## 2021-11-25 DIAGNOSIS — R0602 Shortness of breath: Secondary | ICD-10-CM | POA: Diagnosis not present

## 2021-11-25 DIAGNOSIS — X58XXXA Exposure to other specified factors, initial encounter: Secondary | ICD-10-CM | POA: Diagnosis not present

## 2021-11-25 DIAGNOSIS — Z79899 Other long term (current) drug therapy: Secondary | ICD-10-CM | POA: Diagnosis not present

## 2021-11-25 DIAGNOSIS — E785 Hyperlipidemia, unspecified: Secondary | ICD-10-CM | POA: Diagnosis not present

## 2021-11-25 DIAGNOSIS — Z87891 Personal history of nicotine dependence: Secondary | ICD-10-CM | POA: Diagnosis not present

## 2021-11-25 DIAGNOSIS — Z885 Allergy status to narcotic agent status: Secondary | ICD-10-CM | POA: Diagnosis not present

## 2021-11-29 DIAGNOSIS — J209 Acute bronchitis, unspecified: Secondary | ICD-10-CM | POA: Diagnosis not present

## 2021-11-29 DIAGNOSIS — Z299 Encounter for prophylactic measures, unspecified: Secondary | ICD-10-CM | POA: Diagnosis not present

## 2021-11-29 DIAGNOSIS — S32040A Wedge compression fracture of fourth lumbar vertebra, initial encounter for closed fracture: Secondary | ICD-10-CM | POA: Diagnosis not present

## 2021-11-29 DIAGNOSIS — J440 Chronic obstructive pulmonary disease with acute lower respiratory infection: Secondary | ICD-10-CM | POA: Diagnosis not present

## 2021-11-29 DIAGNOSIS — I10 Essential (primary) hypertension: Secondary | ICD-10-CM | POA: Diagnosis not present

## 2021-12-03 DIAGNOSIS — J61 Pneumoconiosis due to asbestos and other mineral fibers: Secondary | ICD-10-CM | POA: Diagnosis not present

## 2021-12-03 DIAGNOSIS — J449 Chronic obstructive pulmonary disease, unspecified: Secondary | ICD-10-CM | POA: Diagnosis not present

## 2021-12-15 DIAGNOSIS — J9811 Atelectasis: Secondary | ICD-10-CM | POA: Diagnosis not present

## 2021-12-15 DIAGNOSIS — Z87891 Personal history of nicotine dependence: Secondary | ICD-10-CM | POA: Diagnosis not present

## 2021-12-15 DIAGNOSIS — Z7902 Long term (current) use of antithrombotics/antiplatelets: Secondary | ICD-10-CM | POA: Diagnosis not present

## 2021-12-15 DIAGNOSIS — R410 Disorientation, unspecified: Secondary | ICD-10-CM | POA: Diagnosis not present

## 2021-12-15 DIAGNOSIS — I110 Hypertensive heart disease with heart failure: Secondary | ICD-10-CM | POA: Diagnosis not present

## 2021-12-15 DIAGNOSIS — Z66 Do not resuscitate: Secondary | ICD-10-CM | POA: Diagnosis not present

## 2021-12-15 DIAGNOSIS — Z9221 Personal history of antineoplastic chemotherapy: Secondary | ICD-10-CM | POA: Diagnosis not present

## 2021-12-15 DIAGNOSIS — Z885 Allergy status to narcotic agent status: Secondary | ICD-10-CM | POA: Diagnosis not present

## 2021-12-15 DIAGNOSIS — G319 Degenerative disease of nervous system, unspecified: Secondary | ICD-10-CM | POA: Diagnosis not present

## 2021-12-15 DIAGNOSIS — R404 Transient alteration of awareness: Secondary | ICD-10-CM | POA: Diagnosis not present

## 2021-12-15 DIAGNOSIS — I6782 Cerebral ischemia: Secondary | ICD-10-CM | POA: Diagnosis not present

## 2021-12-15 DIAGNOSIS — I6381 Other cerebral infarction due to occlusion or stenosis of small artery: Secondary | ICD-10-CM | POA: Diagnosis not present

## 2021-12-15 DIAGNOSIS — J439 Emphysema, unspecified: Secondary | ICD-10-CM | POA: Diagnosis not present

## 2021-12-15 DIAGNOSIS — I5042 Chronic combined systolic (congestive) and diastolic (congestive) heart failure: Secondary | ICD-10-CM | POA: Diagnosis not present

## 2021-12-15 DIAGNOSIS — J61 Pneumoconiosis due to asbestos and other mineral fibers: Secondary | ICD-10-CM | POA: Diagnosis not present

## 2021-12-15 DIAGNOSIS — Z20822 Contact with and (suspected) exposure to covid-19: Secondary | ICD-10-CM | POA: Diagnosis not present

## 2021-12-15 DIAGNOSIS — R0602 Shortness of breath: Secondary | ICD-10-CM | POA: Diagnosis not present

## 2021-12-15 DIAGNOSIS — E785 Hyperlipidemia, unspecified: Secondary | ICD-10-CM | POA: Diagnosis not present

## 2021-12-15 DIAGNOSIS — J9611 Chronic respiratory failure with hypoxia: Secondary | ICD-10-CM | POA: Diagnosis not present

## 2021-12-15 DIAGNOSIS — M549 Dorsalgia, unspecified: Secondary | ICD-10-CM | POA: Diagnosis not present

## 2021-12-15 DIAGNOSIS — R6889 Other general symptoms and signs: Secondary | ICD-10-CM | POA: Diagnosis not present

## 2021-12-15 DIAGNOSIS — M545 Low back pain, unspecified: Secondary | ICD-10-CM | POA: Diagnosis not present

## 2021-12-15 DIAGNOSIS — Z8673 Personal history of transient ischemic attack (TIA), and cerebral infarction without residual deficits: Secondary | ICD-10-CM | POA: Diagnosis not present

## 2021-12-15 DIAGNOSIS — I252 Old myocardial infarction: Secondary | ICD-10-CM | POA: Diagnosis not present

## 2021-12-15 DIAGNOSIS — Z8551 Personal history of malignant neoplasm of bladder: Secondary | ICD-10-CM | POA: Diagnosis not present

## 2021-12-15 DIAGNOSIS — Z923 Personal history of irradiation: Secondary | ICD-10-CM | POA: Diagnosis not present

## 2021-12-15 DIAGNOSIS — G8929 Other chronic pain: Secondary | ICD-10-CM | POA: Diagnosis not present

## 2021-12-15 DIAGNOSIS — Z79899 Other long term (current) drug therapy: Secondary | ICD-10-CM | POA: Diagnosis not present

## 2021-12-15 DIAGNOSIS — I499 Cardiac arrhythmia, unspecified: Secondary | ICD-10-CM | POA: Diagnosis not present

## 2021-12-15 DIAGNOSIS — Z743 Need for continuous supervision: Secondary | ICD-10-CM | POA: Diagnosis not present

## 2021-12-16 DIAGNOSIS — M545 Low back pain, unspecified: Secondary | ICD-10-CM | POA: Diagnosis not present

## 2021-12-16 DIAGNOSIS — J439 Emphysema, unspecified: Secondary | ICD-10-CM | POA: Diagnosis not present

## 2021-12-16 DIAGNOSIS — Z79899 Other long term (current) drug therapy: Secondary | ICD-10-CM | POA: Diagnosis not present

## 2021-12-16 DIAGNOSIS — I110 Hypertensive heart disease with heart failure: Secondary | ICD-10-CM | POA: Diagnosis not present

## 2021-12-16 DIAGNOSIS — I5032 Chronic diastolic (congestive) heart failure: Secondary | ICD-10-CM | POA: Diagnosis not present

## 2021-12-16 DIAGNOSIS — G8929 Other chronic pain: Secondary | ICD-10-CM | POA: Diagnosis not present

## 2021-12-16 DIAGNOSIS — I639 Cerebral infarction, unspecified: Secondary | ICD-10-CM | POA: Diagnosis not present

## 2021-12-17 DIAGNOSIS — M549 Dorsalgia, unspecified: Secondary | ICD-10-CM | POA: Diagnosis not present

## 2021-12-17 DIAGNOSIS — J61 Pneumoconiosis due to asbestos and other mineral fibers: Secondary | ICD-10-CM | POA: Diagnosis not present

## 2021-12-17 DIAGNOSIS — I6523 Occlusion and stenosis of bilateral carotid arteries: Secondary | ICD-10-CM | POA: Diagnosis not present

## 2021-12-17 DIAGNOSIS — I5032 Chronic diastolic (congestive) heart failure: Secondary | ICD-10-CM | POA: Diagnosis not present

## 2021-12-18 DIAGNOSIS — M549 Dorsalgia, unspecified: Secondary | ICD-10-CM | POA: Diagnosis not present

## 2021-12-18 DIAGNOSIS — J61 Pneumoconiosis due to asbestos and other mineral fibers: Secondary | ICD-10-CM | POA: Diagnosis not present

## 2021-12-18 DIAGNOSIS — I5032 Chronic diastolic (congestive) heart failure: Secondary | ICD-10-CM | POA: Diagnosis not present

## 2021-12-19 DIAGNOSIS — M545 Low back pain, unspecified: Secondary | ICD-10-CM | POA: Diagnosis not present

## 2021-12-19 DIAGNOSIS — Z09 Encounter for follow-up examination after completed treatment for conditions other than malignant neoplasm: Secondary | ICD-10-CM | POA: Diagnosis not present

## 2021-12-19 DIAGNOSIS — I10 Essential (primary) hypertension: Secondary | ICD-10-CM | POA: Diagnosis not present

## 2021-12-27 DIAGNOSIS — I10 Essential (primary) hypertension: Secondary | ICD-10-CM | POA: Diagnosis not present

## 2021-12-27 DIAGNOSIS — Z299 Encounter for prophylactic measures, unspecified: Secondary | ICD-10-CM | POA: Diagnosis not present

## 2021-12-27 DIAGNOSIS — S32030A Wedge compression fracture of third lumbar vertebra, initial encounter for closed fracture: Secondary | ICD-10-CM | POA: Diagnosis not present

## 2021-12-30 DIAGNOSIS — G8929 Other chronic pain: Secondary | ICD-10-CM | POA: Diagnosis not present

## 2021-12-30 DIAGNOSIS — N6481 Ptosis of breast: Secondary | ICD-10-CM | POA: Diagnosis not present

## 2021-12-30 DIAGNOSIS — J449 Chronic obstructive pulmonary disease, unspecified: Secondary | ICD-10-CM | POA: Diagnosis not present

## 2021-12-30 DIAGNOSIS — I10 Essential (primary) hypertension: Secondary | ICD-10-CM | POA: Diagnosis not present

## 2021-12-30 DIAGNOSIS — M549 Dorsalgia, unspecified: Secondary | ICD-10-CM | POA: Diagnosis not present

## 2021-12-30 DIAGNOSIS — Z885 Allergy status to narcotic agent status: Secondary | ICD-10-CM | POA: Diagnosis not present

## 2021-12-30 DIAGNOSIS — Z20822 Contact with and (suspected) exposure to covid-19: Secondary | ICD-10-CM | POA: Diagnosis not present

## 2021-12-30 DIAGNOSIS — F32A Depression, unspecified: Secondary | ICD-10-CM | POA: Diagnosis not present

## 2021-12-30 DIAGNOSIS — Z87891 Personal history of nicotine dependence: Secondary | ICD-10-CM | POA: Diagnosis not present

## 2021-12-31 DIAGNOSIS — J449 Chronic obstructive pulmonary disease, unspecified: Secondary | ICD-10-CM | POA: Diagnosis not present

## 2021-12-31 DIAGNOSIS — J61 Pneumoconiosis due to asbestos and other mineral fibers: Secondary | ICD-10-CM | POA: Diagnosis not present

## 2022-01-02 DIAGNOSIS — R6889 Other general symptoms and signs: Secondary | ICD-10-CM | POA: Diagnosis not present

## 2022-01-02 DIAGNOSIS — Z7401 Bed confinement status: Secondary | ICD-10-CM | POA: Diagnosis not present

## 2022-01-02 DIAGNOSIS — Z743 Need for continuous supervision: Secondary | ICD-10-CM | POA: Diagnosis not present

## 2022-01-08 ENCOUNTER — Telehealth: Payer: Self-pay | Admitting: Pulmonary Disease

## 2022-01-08 NOTE — Telephone Encounter (Signed)
Spoke to patient's spouse, Marcia(DPR). ?Tomi Bamberger is questioning what patient's dx is. She is questioning if patient has mesotheliomas. ? ?Dr. Loanne Drilling, please advise. Thanks ?

## 2022-01-08 NOTE — Telephone Encounter (Signed)
I have attempted to call patient and his wife separately. Unable to leave voicemail due to no VM set up. We can discuss his pleural plaques at next visit. ? ?Would recommend serial imaging to monitor for stability. Could consider pleural biopsy however risks outweigh benefits as patient is high risk for pulmonary complications including respiratory failure and pneumothorax. Suspicion for mesothelioma is low but cannot rule out. If he were to develop new pleural effusion, would recommend further evaluation at that time. ? ?Keep follow-up for 01/17/2022. ?

## 2022-01-17 ENCOUNTER — Ambulatory Visit: Payer: Medicare Other | Admitting: Pulmonary Disease

## 2022-01-17 NOTE — Progress Notes (Deleted)
? ? ?Subjective:  ? ?PATIENT ID: Timothy [name] GENDER: male DOB: May 27, 1946, MRN: [id number] ? ? ?HPI ? ?No chief complaint on file. ? ? ?Reason for Visit: Follow-up COPD ? ?[name] is a 76 year old male former smoker  with COPD who presents for follow-up. ? ?06/18/21 ?He is compliant with Trelegy. Denies coughing or wheezing. Shortness of breath with exertion so activity remains limited but able to ambulate within house and perform daily tasks. He is compliant with his oxygen.  He will uses the albuterol three times a week, 2-3 puffs which is improved compared to our last visit. Symptoms usually triggered with exertion or changes in weather. He reports occasional dizziness and unsure if this is related to his blood pressure medications.  ? ?11/19/21 ?Since his last visit he reports worsening shortness of breath. He is starting to wheezing with exertion. Occasional cough sometimes with sputum production. He is also having worsening back and joint pain. Had to go to the ED on 1/20 and treated with pain medication. He uses his albuterol twice a day. Has been compliant with Trelegy and feels this is not working. He is compliant with his oxygen and on 2L. Although symptomatic, denies recent exacerbations requiring steroids or antibiotics in the last 5 months. ? ?01/17/22 ?*** ? ?Social History: ?Quit in 2004. 42 pack history ? 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  ? ?Past Medical History:  ?Diagnosis Date  ? COPD (chronic obstructive pulmonary disease) ([place])   ? Essential hypertension   ? History of asbestosis 2010  ? History of cardiac catheterization 2012  ? Reportedly normal coronaries  ? Respiratory failure with hypoxia ([place])   ? Home oxygen  ?  ? ?Family History  ?Problem Relation Age of Onset  ? Lung disease Mother   ?     Asbestosis.  ? Pancreatic cancer Father   ? Breast cancer Sister   ?  ? ?Social History  ? ?Occupational History  ? Not on file  ?Tobacco Use  ? Smoking status: Former  ?   Packs/day: 1.00  ?  Years: 42.00  ?  Pack years: 42.00  ?  Types: Cigarettes  ?  Start date: 1962  ?  Quit date: 2004  ?  Years since quitting: 19.2  ? Smokeless tobacco: Never  ?Vaping Use  ? Vaping Use: Never used  ?Substance and Sexual Activity  ? Alcohol use: Yes  ?  Alcohol/week: 1.0 standard drink  ?  Types: 1 Cans of beer per week  ?  Comment: occas  ? Drug use: No  ? Sexual activity: Not on file  ? ? ?Allergies  ?Allergen Reactions  ? Codeine Itching  ?  ? ?Outpatient Medications Prior to Visit  ?Medication Sig Dispense Refill  ? albuterol (VENTOLIN HFA) 108 (90 Base) MCG/ACT inhaler Inhale 2 puffs into the lungs every 6 (six) hours as needed for wheezing or shortness of breath. 18 g 3  ? aspirin 81 MG chewable tablet Chew 1 tablet (81 mg total) by mouth daily. 30 tablet 0  ? Fluticasone-Umeclidin-Vilant (TRELEGY ELLIPTA) 200-62.5-25 MCG/ACT AEPB Inhale 1 puff into the lungs daily at 2 PM. 60 each 5  ? ipratropium-albuterol (DUONEB) 0.5-2.5 (3) MG/3ML SOLN Take 3 mLs by nebulization every 6 (six) hours as needed. 360 mL 5  ? lisinopril (ZESTRIL) 10 MG tablet Take 10 mg by mouth daily.    ? OXYGEN 2 LPM    ? ?No facility-administered medications prior to visit.  ? ?  Review of Systems  ?Constitutional:  Negative for chills, diaphoresis, fever, malaise/fatigue and weight loss.  ?HENT:  Negative for congestion.   ?Respiratory:  Negative for cough, hemoptysis, sputum production, shortness of breath and wheezing.   ?Cardiovascular:  Negative for chest pain, palpitations and leg swelling.  ? ?Objective:  ? ?There were no vitals filed for this visit. ? ?  ? ?Physical Exam: ?General: Well-appearing, no acute distress ?HENT: , AT ?Eyes: EOMI, no scleral icterus ?Respiratory: Clear to auscultation bilaterally.  No crackles, wheezing or rales ?Cardiovascular: RRR, -M/R/G, no JVD ?Extremities:-Edema,-tenderness ?Neuro: AAO x4, CNII-XII grossly intact ?Psych: Normal mood, normal affect ? ? ?Data  Reviewed: ? ?Imaging: ?11/17/18 CT Chest in PACS reviewed: 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 Unable to visualize 63m pulmonary nodule in RLL mentioned in report due to architectural distortion ?CTA 05/24/20 (report only) - Calcified pleural plaques bilaterally. ?08/29/21 - CT Chest - Calcified pleural plaques. Bullous emphysema. Bilateral Bochdalek's hernias ? ?PFT: ?12/31/11 Interpretation from [facility]: Severe obstructive along with restrictive lung disease ?   ?Assessment & Plan:  ? 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 No exacerbations in >1 year. We discussed clinical course of COPD, bronchodilator management and early treatment of exacerbations. ? 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 ? ?Trelegy - failed to control symptoms despite compliance.  ? ?Severe COPD with bullous emphysema - uncontrolled ?--STOP Trelegy ?--START Breztri TWO puffs TWICE a day ?--CONTINUE Albuterol AS NEEDED ?--Discuss pulmonary rehab at the next visit ? ?Chronic hypoxemic respiratory failure ?--Continue supplemental oxygen for goal SpO2 >88% ?--Ambulatory O2 demonstrates need for 2L with exertion and sleep ? 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 ? ?Health Maintenance ?Immunization History  ?Administered Date(s) Administered  ? Fluad Quad(high Dose 65+) 09/15/2019, 10/16/2020  ? Influenza Split 08/16/2013  ? Influenza Whole 08/09/2018  ? Influenza, High Dose Seasonal PF 08/26/2015  ? Influenza,inj,Quad PF,6+ Mos 11/07/2014, 09/22/2017, 08/19/2018  ? PFIZER(Purple Top)SARS-COV-2 Vaccination 01/24/2020, 02/18/2020  ? Pneumococcal Conjugate-13 08/26/2015  ? Pneumococcal Polysaccharide-23 08/12/2012  ? ? ?No orders of the defined types were placed in this encounter. ? ?No orders of the defined types were  placed in this encounter. ? ?No follow-ups on file. ? ?I have spent a total time of 33-minutes on the day of the appointment reviewing prior documentation, coordinating care and discussing medical diagnosis and plan with the patient/family. Past medical history, allergies, medications were reviewed. Pertinent imaging, labs and tests included in this note have been reviewed and interpreted independently by me. ? ?Jodye Scali JaRodman PickleMD ?[facility] ?01/17/2022 10:29 AM  ?Office Number [phone] ? ?

## 2022-01-18 DIAGNOSIS — Z0000 Encounter for general adult medical examination without abnormal findings: Secondary | ICD-10-CM | POA: Diagnosis not present

## 2022-01-18 DIAGNOSIS — Z299 Encounter for prophylactic measures, unspecified: Secondary | ICD-10-CM | POA: Diagnosis not present

## 2022-01-18 DIAGNOSIS — Z6824 Body mass index (BMI) 24.0-24.9, adult: Secondary | ICD-10-CM | POA: Diagnosis not present

## 2022-01-18 DIAGNOSIS — I10 Essential (primary) hypertension: Secondary | ICD-10-CM | POA: Diagnosis not present

## 2022-01-18 DIAGNOSIS — Z713 Dietary counseling and surveillance: Secondary | ICD-10-CM | POA: Diagnosis not present

## 2022-01-18 DIAGNOSIS — Z7189 Other specified counseling: Secondary | ICD-10-CM | POA: Diagnosis not present

## 2022-01-31 DIAGNOSIS — J61 Pneumoconiosis due to asbestos and other mineral fibers: Secondary | ICD-10-CM | POA: Diagnosis not present

## 2022-01-31 DIAGNOSIS — J449 Chronic obstructive pulmonary disease, unspecified: Secondary | ICD-10-CM | POA: Diagnosis not present

## 2022-02-15 DIAGNOSIS — R6889 Other general symptoms and signs: Secondary | ICD-10-CM | POA: Diagnosis not present

## 2022-02-15 DIAGNOSIS — R0602 Shortness of breath: Secondary | ICD-10-CM | POA: Diagnosis not present

## 2022-02-15 DIAGNOSIS — E785 Hyperlipidemia, unspecified: Secondary | ICD-10-CM | POA: Diagnosis not present

## 2022-02-15 DIAGNOSIS — S299XXA Unspecified injury of thorax, initial encounter: Secondary | ICD-10-CM | POA: Diagnosis not present

## 2022-02-15 DIAGNOSIS — S3991XA Unspecified injury of abdomen, initial encounter: Secondary | ICD-10-CM | POA: Diagnosis not present

## 2022-02-15 DIAGNOSIS — M25552 Pain in left hip: Secondary | ICD-10-CM | POA: Diagnosis not present

## 2022-02-15 DIAGNOSIS — Z79899 Other long term (current) drug therapy: Secondary | ICD-10-CM | POA: Diagnosis not present

## 2022-02-15 DIAGNOSIS — M1612 Unilateral primary osteoarthritis, left hip: Secondary | ICD-10-CM | POA: Diagnosis not present

## 2022-02-15 DIAGNOSIS — N179 Acute kidney failure, unspecified: Secondary | ICD-10-CM | POA: Diagnosis not present

## 2022-02-15 DIAGNOSIS — S62605A Fracture of unspecified phalanx of left ring finger, initial encounter for closed fracture: Secondary | ICD-10-CM | POA: Diagnosis not present

## 2022-02-15 DIAGNOSIS — S62635A Displaced fracture of distal phalanx of left ring finger, initial encounter for closed fracture: Secondary | ICD-10-CM | POA: Diagnosis not present

## 2022-02-15 DIAGNOSIS — R918 Other nonspecific abnormal finding of lung field: Secondary | ICD-10-CM | POA: Diagnosis not present

## 2022-02-15 DIAGNOSIS — S7290XA Unspecified fracture of unspecified femur, initial encounter for closed fracture: Secondary | ICD-10-CM | POA: Diagnosis not present

## 2022-02-15 DIAGNOSIS — Y998 Other external cause status: Secondary | ICD-10-CM | POA: Diagnosis not present

## 2022-02-15 DIAGNOSIS — Z8673 Personal history of transient ischemic attack (TIA), and cerebral infarction without residual deficits: Secondary | ICD-10-CM | POA: Diagnosis not present

## 2022-02-15 DIAGNOSIS — Z87891 Personal history of nicotine dependence: Secondary | ICD-10-CM | POA: Diagnosis not present

## 2022-02-15 DIAGNOSIS — W1789XA Other fall from one level to another, initial encounter: Secondary | ICD-10-CM | POA: Diagnosis not present

## 2022-02-15 DIAGNOSIS — G8911 Acute pain due to trauma: Secondary | ICD-10-CM | POA: Diagnosis not present

## 2022-02-15 DIAGNOSIS — R519 Headache, unspecified: Secondary | ICD-10-CM | POA: Diagnosis not present

## 2022-02-15 DIAGNOSIS — S32425A Nondisplaced fracture of posterior wall of left acetabulum, initial encounter for closed fracture: Secondary | ICD-10-CM | POA: Diagnosis not present

## 2022-02-15 DIAGNOSIS — S32492A Other specified fracture of left acetabulum, initial encounter for closed fracture: Secondary | ICD-10-CM | POA: Diagnosis not present

## 2022-02-15 DIAGNOSIS — R339 Retention of urine, unspecified: Secondary | ICD-10-CM | POA: Diagnosis not present

## 2022-02-15 DIAGNOSIS — Z5181 Encounter for therapeutic drug level monitoring: Secondary | ICD-10-CM | POA: Diagnosis not present

## 2022-02-15 DIAGNOSIS — Z043 Encounter for examination and observation following other accident: Secondary | ICD-10-CM | POA: Diagnosis not present

## 2022-02-15 DIAGNOSIS — R3915 Urgency of urination: Secondary | ICD-10-CM | POA: Diagnosis not present

## 2022-02-15 DIAGNOSIS — J439 Emphysema, unspecified: Secondary | ICD-10-CM | POA: Diagnosis not present

## 2022-02-15 DIAGNOSIS — S62665A Nondisplaced fracture of distal phalanx of left ring finger, initial encounter for closed fracture: Secondary | ICD-10-CM | POA: Diagnosis not present

## 2022-02-15 DIAGNOSIS — E559 Vitamin D deficiency, unspecified: Secondary | ICD-10-CM | POA: Diagnosis not present

## 2022-02-15 DIAGNOSIS — F32A Depression, unspecified: Secondary | ICD-10-CM | POA: Diagnosis not present

## 2022-02-15 DIAGNOSIS — I10 Essential (primary) hypertension: Secondary | ICD-10-CM | POA: Diagnosis not present

## 2022-02-15 DIAGNOSIS — Z743 Need for continuous supervision: Secondary | ICD-10-CM | POA: Diagnosis not present

## 2022-02-15 DIAGNOSIS — J449 Chronic obstructive pulmonary disease, unspecified: Secondary | ICD-10-CM | POA: Diagnosis not present

## 2022-02-15 DIAGNOSIS — S32392A Other fracture of left ilium, initial encounter for closed fracture: Secondary | ICD-10-CM | POA: Diagnosis not present

## 2022-02-15 DIAGNOSIS — S32402A Unspecified fracture of left acetabulum, initial encounter for closed fracture: Secondary | ICD-10-CM | POA: Diagnosis not present

## 2022-02-15 DIAGNOSIS — W19XXXA Unspecified fall, initial encounter: Secondary | ICD-10-CM | POA: Diagnosis not present

## 2022-02-15 DIAGNOSIS — R079 Chest pain, unspecified: Secondary | ICD-10-CM | POA: Diagnosis not present

## 2022-02-15 DIAGNOSIS — Z7902 Long term (current) use of antithrombotics/antiplatelets: Secondary | ICD-10-CM | POA: Diagnosis not present

## 2022-02-15 DIAGNOSIS — S72002A Fracture of unspecified part of neck of left femur, initial encounter for closed fracture: Secondary | ICD-10-CM | POA: Diagnosis not present

## 2022-02-15 DIAGNOSIS — S32302A Unspecified fracture of left ilium, initial encounter for closed fracture: Secondary | ICD-10-CM | POA: Diagnosis not present

## 2022-02-16 DIAGNOSIS — Z743 Need for continuous supervision: Secondary | ICD-10-CM | POA: Diagnosis not present

## 2022-02-16 DIAGNOSIS — J449 Chronic obstructive pulmonary disease, unspecified: Secondary | ICD-10-CM | POA: Diagnosis not present

## 2022-02-16 DIAGNOSIS — S32402A Unspecified fracture of left acetabulum, initial encounter for closed fracture: Secondary | ICD-10-CM | POA: Diagnosis not present

## 2022-02-16 DIAGNOSIS — S32402D Unspecified fracture of left acetabulum, subsequent encounter for fracture with routine healing: Secondary | ICD-10-CM | POA: Diagnosis not present

## 2022-02-16 DIAGNOSIS — F32A Depression, unspecified: Secondary | ICD-10-CM | POA: Diagnosis not present

## 2022-02-16 DIAGNOSIS — Z8673 Personal history of transient ischemic attack (TIA), and cerebral infarction without residual deficits: Secondary | ICD-10-CM | POA: Diagnosis not present

## 2022-02-16 DIAGNOSIS — R3915 Urgency of urination: Secondary | ICD-10-CM | POA: Diagnosis not present

## 2022-02-16 DIAGNOSIS — M6281 Muscle weakness (generalized): Secondary | ICD-10-CM | POA: Diagnosis not present

## 2022-02-16 DIAGNOSIS — S32302D Unspecified fracture of left ilium, subsequent encounter for fracture with routine healing: Secondary | ICD-10-CM | POA: Diagnosis not present

## 2022-02-16 DIAGNOSIS — N179 Acute kidney failure, unspecified: Secondary | ICD-10-CM | POA: Diagnosis not present

## 2022-02-16 DIAGNOSIS — Z5181 Encounter for therapeutic drug level monitoring: Secondary | ICD-10-CM | POA: Diagnosis not present

## 2022-02-16 DIAGNOSIS — R339 Retention of urine, unspecified: Secondary | ICD-10-CM | POA: Diagnosis not present

## 2022-02-16 DIAGNOSIS — E559 Vitamin D deficiency, unspecified: Secondary | ICD-10-CM | POA: Diagnosis not present

## 2022-02-16 DIAGNOSIS — M25472 Effusion, left ankle: Secondary | ICD-10-CM | POA: Diagnosis not present

## 2022-02-16 DIAGNOSIS — R531 Weakness: Secondary | ICD-10-CM | POA: Diagnosis not present

## 2022-02-16 DIAGNOSIS — S32302A Unspecified fracture of left ilium, initial encounter for closed fracture: Secondary | ICD-10-CM | POA: Diagnosis not present

## 2022-02-16 DIAGNOSIS — Z4789 Encounter for other orthopedic aftercare: Secondary | ICD-10-CM | POA: Diagnosis not present

## 2022-02-16 DIAGNOSIS — I10 Essential (primary) hypertension: Secondary | ICD-10-CM | POA: Diagnosis not present

## 2022-02-16 DIAGNOSIS — S62635A Displaced fracture of distal phalanx of left ring finger, initial encounter for closed fracture: Secondary | ICD-10-CM | POA: Diagnosis not present

## 2022-02-16 DIAGNOSIS — W19XXXS Unspecified fall, sequela: Secondary | ICD-10-CM | POA: Diagnosis not present

## 2022-02-16 DIAGNOSIS — W19XXXD Unspecified fall, subsequent encounter: Secondary | ICD-10-CM | POA: Diagnosis not present

## 2022-02-16 DIAGNOSIS — Z7902 Long term (current) use of antithrombotics/antiplatelets: Secondary | ICD-10-CM | POA: Diagnosis not present

## 2022-02-16 DIAGNOSIS — S62605D Fracture of unspecified phalanx of left ring finger, subsequent encounter for fracture with routine healing: Secondary | ICD-10-CM | POA: Diagnosis not present

## 2022-02-16 DIAGNOSIS — W19XXXA Unspecified fall, initial encounter: Secondary | ICD-10-CM | POA: Diagnosis not present

## 2022-02-16 DIAGNOSIS — S72002A Fracture of unspecified part of neck of left femur, initial encounter for closed fracture: Secondary | ICD-10-CM | POA: Diagnosis not present

## 2022-02-16 DIAGNOSIS — S62605A Fracture of unspecified phalanx of left ring finger, initial encounter for closed fracture: Secondary | ICD-10-CM | POA: Diagnosis not present

## 2022-02-16 DIAGNOSIS — Z4781 Encounter for orthopedic aftercare following surgical amputation: Secondary | ICD-10-CM | POA: Diagnosis not present

## 2022-02-16 DIAGNOSIS — E785 Hyperlipidemia, unspecified: Secondary | ICD-10-CM | POA: Diagnosis not present

## 2022-02-19 DIAGNOSIS — S32302A Unspecified fracture of left ilium, initial encounter for closed fracture: Secondary | ICD-10-CM | POA: Diagnosis not present

## 2022-02-19 DIAGNOSIS — R269 Unspecified abnormalities of gait and mobility: Secondary | ICD-10-CM | POA: Diagnosis not present

## 2022-02-19 DIAGNOSIS — E559 Vitamin D deficiency, unspecified: Secondary | ICD-10-CM | POA: Diagnosis not present

## 2022-02-19 DIAGNOSIS — R52 Pain, unspecified: Secondary | ICD-10-CM | POA: Diagnosis not present

## 2022-02-19 DIAGNOSIS — F32A Depression, unspecified: Secondary | ICD-10-CM | POA: Diagnosis not present

## 2022-02-19 DIAGNOSIS — Z4781 Encounter for orthopedic aftercare following surgical amputation: Secondary | ICD-10-CM | POA: Diagnosis not present

## 2022-02-19 DIAGNOSIS — W19XXXA Unspecified fall, initial encounter: Secondary | ICD-10-CM | POA: Diagnosis not present

## 2022-02-19 DIAGNOSIS — Z743 Need for continuous supervision: Secondary | ICD-10-CM | POA: Diagnosis not present

## 2022-02-19 DIAGNOSIS — R531 Weakness: Secondary | ICD-10-CM | POA: Diagnosis not present

## 2022-02-19 DIAGNOSIS — J61 Pneumoconiosis due to asbestos and other mineral fibers: Secondary | ICD-10-CM | POA: Diagnosis not present

## 2022-02-19 DIAGNOSIS — G8911 Acute pain due to trauma: Secondary | ICD-10-CM | POA: Diagnosis not present

## 2022-02-19 DIAGNOSIS — I10 Essential (primary) hypertension: Secondary | ICD-10-CM | POA: Diagnosis not present

## 2022-02-19 DIAGNOSIS — Z4789 Encounter for other orthopedic aftercare: Secondary | ICD-10-CM | POA: Diagnosis not present

## 2022-02-19 DIAGNOSIS — W19XXXS Unspecified fall, sequela: Secondary | ICD-10-CM | POA: Diagnosis not present

## 2022-02-19 DIAGNOSIS — R296 Repeated falls: Secondary | ICD-10-CM | POA: Diagnosis not present

## 2022-02-19 DIAGNOSIS — M79642 Pain in left hand: Secondary | ICD-10-CM | POA: Diagnosis not present

## 2022-02-19 DIAGNOSIS — W19XXXD Unspecified fall, subsequent encounter: Secondary | ICD-10-CM | POA: Diagnosis not present

## 2022-02-19 DIAGNOSIS — S32402D Unspecified fracture of left acetabulum, subsequent encounter for fracture with routine healing: Secondary | ICD-10-CM | POA: Diagnosis not present

## 2022-02-19 DIAGNOSIS — S62605A Fracture of unspecified phalanx of left ring finger, initial encounter for closed fracture: Secondary | ICD-10-CM | POA: Diagnosis not present

## 2022-02-19 DIAGNOSIS — M6281 Muscle weakness (generalized): Secondary | ICD-10-CM | POA: Diagnosis not present

## 2022-02-19 DIAGNOSIS — J449 Chronic obstructive pulmonary disease, unspecified: Secondary | ICD-10-CM | POA: Diagnosis not present

## 2022-02-19 DIAGNOSIS — S62605D Fracture of unspecified phalanx of left ring finger, subsequent encounter for fracture with routine healing: Secondary | ICD-10-CM | POA: Diagnosis not present

## 2022-02-19 DIAGNOSIS — S32302D Unspecified fracture of left ilium, subsequent encounter for fracture with routine healing: Secondary | ICD-10-CM | POA: Diagnosis not present

## 2022-02-21 DIAGNOSIS — M6281 Muscle weakness (generalized): Secondary | ICD-10-CM | POA: Diagnosis not present

## 2022-02-21 DIAGNOSIS — W19XXXS Unspecified fall, sequela: Secondary | ICD-10-CM | POA: Diagnosis not present

## 2022-02-21 DIAGNOSIS — S62605D Fracture of unspecified phalanx of left ring finger, subsequent encounter for fracture with routine healing: Secondary | ICD-10-CM | POA: Diagnosis not present

## 2022-02-21 DIAGNOSIS — E559 Vitamin D deficiency, unspecified: Secondary | ICD-10-CM | POA: Diagnosis not present

## 2022-02-21 DIAGNOSIS — J449 Chronic obstructive pulmonary disease, unspecified: Secondary | ICD-10-CM | POA: Diagnosis not present

## 2022-02-21 DIAGNOSIS — S32302D Unspecified fracture of left ilium, subsequent encounter for fracture with routine healing: Secondary | ICD-10-CM | POA: Diagnosis not present

## 2022-02-21 DIAGNOSIS — S32402D Unspecified fracture of left acetabulum, subsequent encounter for fracture with routine healing: Secondary | ICD-10-CM | POA: Diagnosis not present

## 2022-02-21 DIAGNOSIS — I10 Essential (primary) hypertension: Secondary | ICD-10-CM | POA: Diagnosis not present

## 2022-03-01 DIAGNOSIS — R296 Repeated falls: Secondary | ICD-10-CM | POA: Diagnosis not present

## 2022-03-01 DIAGNOSIS — J449 Chronic obstructive pulmonary disease, unspecified: Secondary | ICD-10-CM | POA: Diagnosis not present

## 2022-03-01 DIAGNOSIS — I10 Essential (primary) hypertension: Secondary | ICD-10-CM | POA: Diagnosis not present

## 2022-03-01 DIAGNOSIS — G8911 Acute pain due to trauma: Secondary | ICD-10-CM | POA: Diagnosis not present

## 2022-03-02 DIAGNOSIS — J449 Chronic obstructive pulmonary disease, unspecified: Secondary | ICD-10-CM | POA: Diagnosis not present

## 2022-03-02 DIAGNOSIS — J61 Pneumoconiosis due to asbestos and other mineral fibers: Secondary | ICD-10-CM | POA: Diagnosis not present

## 2022-03-05 DIAGNOSIS — R52 Pain, unspecified: Secondary | ICD-10-CM | POA: Diagnosis not present

## 2022-03-05 DIAGNOSIS — R269 Unspecified abnormalities of gait and mobility: Secondary | ICD-10-CM | POA: Diagnosis not present

## 2022-03-06 DIAGNOSIS — M79642 Pain in left hand: Secondary | ICD-10-CM | POA: Diagnosis not present

## 2022-03-31 DIAGNOSIS — J449 Chronic obstructive pulmonary disease, unspecified: Secondary | ICD-10-CM | POA: Diagnosis not present

## 2022-03-31 DIAGNOSIS — E785 Hyperlipidemia, unspecified: Secondary | ICD-10-CM | POA: Diagnosis not present

## 2022-03-31 DIAGNOSIS — R1111 Vomiting without nausea: Secondary | ICD-10-CM | POA: Diagnosis not present

## 2022-03-31 DIAGNOSIS — R112 Nausea with vomiting, unspecified: Secondary | ICD-10-CM | POA: Diagnosis not present

## 2022-03-31 DIAGNOSIS — I440 Atrioventricular block, first degree: Secondary | ICD-10-CM | POA: Diagnosis not present

## 2022-03-31 DIAGNOSIS — Z79899 Other long term (current) drug therapy: Secondary | ICD-10-CM | POA: Diagnosis not present

## 2022-03-31 DIAGNOSIS — R0689 Other abnormalities of breathing: Secondary | ICD-10-CM | POA: Diagnosis not present

## 2022-03-31 DIAGNOSIS — R1110 Vomiting, unspecified: Secondary | ICD-10-CM | POA: Diagnosis not present

## 2022-03-31 DIAGNOSIS — I10 Essential (primary) hypertension: Secondary | ICD-10-CM | POA: Diagnosis not present

## 2022-03-31 DIAGNOSIS — Z743 Need for continuous supervision: Secondary | ICD-10-CM | POA: Diagnosis not present

## 2022-03-31 DIAGNOSIS — Z87891 Personal history of nicotine dependence: Secondary | ICD-10-CM | POA: Diagnosis not present

## 2022-03-31 DIAGNOSIS — Z7902 Long term (current) use of antithrombotics/antiplatelets: Secondary | ICD-10-CM | POA: Diagnosis not present

## 2022-03-31 DIAGNOSIS — R9431 Abnormal electrocardiogram [ECG] [EKG]: Secondary | ICD-10-CM | POA: Diagnosis not present

## 2022-03-31 DIAGNOSIS — F1090 Alcohol use, unspecified, uncomplicated: Secondary | ICD-10-CM | POA: Diagnosis not present

## 2022-03-31 DIAGNOSIS — Z8673 Personal history of transient ischemic attack (TIA), and cerebral infarction without residual deficits: Secondary | ICD-10-CM | POA: Diagnosis not present

## 2022-03-31 DIAGNOSIS — Z9981 Dependence on supplemental oxygen: Secondary | ICD-10-CM | POA: Diagnosis not present

## 2022-04-02 DIAGNOSIS — J449 Chronic obstructive pulmonary disease, unspecified: Secondary | ICD-10-CM | POA: Diagnosis not present

## 2022-04-02 DIAGNOSIS — J61 Pneumoconiosis due to asbestos and other mineral fibers: Secondary | ICD-10-CM | POA: Diagnosis not present

## 2022-04-19 DIAGNOSIS — I10 Essential (primary) hypertension: Secondary | ICD-10-CM | POA: Diagnosis not present

## 2022-04-19 DIAGNOSIS — Z299 Encounter for prophylactic measures, unspecified: Secondary | ICD-10-CM | POA: Diagnosis not present

## 2022-04-19 DIAGNOSIS — J9611 Chronic respiratory failure with hypoxia: Secondary | ICD-10-CM | POA: Diagnosis not present

## 2022-04-19 DIAGNOSIS — J449 Chronic obstructive pulmonary disease, unspecified: Secondary | ICD-10-CM | POA: Diagnosis not present

## 2022-04-23 ENCOUNTER — Other Ambulatory Visit: Payer: Self-pay | Admitting: *Deleted

## 2022-04-23 MED ORDER — TRELEGY ELLIPTA 200-62.5-25 MCG/ACT IN AEPB: 1.0000 | INHALATION_SPRAY | Freq: Every day | RESPIRATORY_TRACT | 5 refills | Status: DC

## 2022-04-25 ENCOUNTER — Observation Stay (HOSPITAL_COMMUNITY)
Admission: EM | Admit: 2022-04-25 | Discharge: 2022-04-26 | Disposition: A | Discharge status: Home Health | Payer: Medicare Other | Attending: Internal Medicine | Admitting: Internal Medicine

## 2022-04-25 ENCOUNTER — Emergency Department (HOSPITAL_COMMUNITY): Payer: Medicare Other

## 2022-04-25 ENCOUNTER — Encounter (HOSPITAL_COMMUNITY): Payer: Self-pay | Admitting: *Deleted

## 2022-04-25 ENCOUNTER — Inpatient Hospital Stay (HOSPITAL_BASED_OUTPATIENT_CLINIC_OR_DEPARTMENT_OTHER): Payer: Medicare Other

## 2022-04-25 ENCOUNTER — Other Ambulatory Visit (HOSPITAL_COMMUNITY): Payer: Self-pay | Admitting: *Deleted

## 2022-04-25 ENCOUNTER — Other Ambulatory Visit: Payer: Self-pay

## 2022-04-25 DIAGNOSIS — I6389 Other cerebral infarction: Secondary | ICD-10-CM

## 2022-04-25 DIAGNOSIS — I6789 Other cerebrovascular disease: Secondary | ICD-10-CM | POA: Diagnosis not present

## 2022-04-25 DIAGNOSIS — Z77090 Contact with and (suspected) exposure to asbestos: Secondary | ICD-10-CM | POA: Insufficient documentation

## 2022-04-25 DIAGNOSIS — I663 Occlusion and stenosis of cerebellar arteries: Secondary | ICD-10-CM | POA: Insufficient documentation

## 2022-04-25 DIAGNOSIS — I63512 Cerebral infarction due to unspecified occlusion or stenosis of left middle cerebral artery: Secondary | ICD-10-CM

## 2022-04-25 DIAGNOSIS — J9611 Chronic respiratory failure with hypoxia: Secondary | ICD-10-CM | POA: Diagnosis not present

## 2022-04-25 DIAGNOSIS — R29710 NIHSS score 10: Secondary | ICD-10-CM | POA: Insufficient documentation

## 2022-04-25 DIAGNOSIS — I700 Atherosclerosis of aorta: Secondary | ICD-10-CM | POA: Diagnosis not present

## 2022-04-25 DIAGNOSIS — Z8709 Personal history of other diseases of the respiratory system: Secondary | ICD-10-CM | POA: Insufficient documentation

## 2022-04-25 DIAGNOSIS — I10 Essential (primary) hypertension: Secondary | ICD-10-CM | POA: Diagnosis not present

## 2022-04-25 DIAGNOSIS — I6522 Occlusion and stenosis of left carotid artery: Secondary | ICD-10-CM | POA: Insufficient documentation

## 2022-04-25 DIAGNOSIS — J449 Chronic obstructive pulmonary disease, unspecified: Secondary | ICD-10-CM | POA: Insufficient documentation

## 2022-04-25 DIAGNOSIS — R471 Dysarthria and anarthria: Secondary | ICD-10-CM | POA: Insufficient documentation

## 2022-04-25 DIAGNOSIS — Z20822 Contact with and (suspected) exposure to covid-19: Secondary | ICD-10-CM | POA: Diagnosis not present

## 2022-04-25 DIAGNOSIS — R41841 Cognitive communication deficit: Secondary | ICD-10-CM | POA: Insufficient documentation

## 2022-04-25 DIAGNOSIS — R29810 Facial weakness: Secondary | ICD-10-CM | POA: Insufficient documentation

## 2022-04-25 DIAGNOSIS — I5032 Chronic diastolic (congestive) heart failure: Secondary | ICD-10-CM | POA: Diagnosis not present

## 2022-04-25 DIAGNOSIS — R482 Apraxia: Secondary | ICD-10-CM | POA: Insufficient documentation

## 2022-04-25 DIAGNOSIS — Z9981 Dependence on supplemental oxygen: Secondary | ICD-10-CM | POA: Insufficient documentation

## 2022-04-25 DIAGNOSIS — I447 Left bundle-branch block, unspecified: Secondary | ICD-10-CM | POA: Diagnosis not present

## 2022-04-25 DIAGNOSIS — I639 Cerebral infarction, unspecified: Secondary | ICD-10-CM | POA: Diagnosis present

## 2022-04-25 DIAGNOSIS — I6300 Cerebral infarction due to thrombosis of unspecified precerebral artery: Secondary | ICD-10-CM | POA: Insufficient documentation

## 2022-04-25 DIAGNOSIS — I110 Hypertensive heart disease with heart failure: Secondary | ICD-10-CM | POA: Diagnosis not present

## 2022-04-25 DIAGNOSIS — R9082 White matter disease, unspecified: Secondary | ICD-10-CM | POA: Insufficient documentation

## 2022-04-25 DIAGNOSIS — M6281 Muscle weakness (generalized): Secondary | ICD-10-CM | POA: Diagnosis not present

## 2022-04-25 DIAGNOSIS — J439 Emphysema, unspecified: Secondary | ICD-10-CM | POA: Insufficient documentation

## 2022-04-25 DIAGNOSIS — Z8673 Personal history of transient ischemic attack (TIA), and cerebral infarction without residual deficits: Secondary | ICD-10-CM | POA: Diagnosis not present

## 2022-04-25 DIAGNOSIS — R404 Transient alteration of awareness: Secondary | ICD-10-CM | POA: Diagnosis not present

## 2022-04-25 DIAGNOSIS — Z87891 Personal history of nicotine dependence: Secondary | ICD-10-CM | POA: Diagnosis not present

## 2022-04-25 DIAGNOSIS — Z7902 Long term (current) use of antithrombotics/antiplatelets: Secondary | ICD-10-CM | POA: Insufficient documentation

## 2022-04-25 DIAGNOSIS — R2689 Other abnormalities of gait and mobility: Secondary | ICD-10-CM | POA: Diagnosis not present

## 2022-04-25 DIAGNOSIS — R4701 Aphasia: Secondary | ICD-10-CM | POA: Diagnosis not present

## 2022-04-25 DIAGNOSIS — Z79899 Other long term (current) drug therapy: Secondary | ICD-10-CM | POA: Diagnosis not present

## 2022-04-25 DIAGNOSIS — I6381 Other cerebral infarction due to occlusion or stenosis of small artery: Secondary | ICD-10-CM | POA: Insufficient documentation

## 2022-04-25 DIAGNOSIS — I672 Cerebral atherosclerosis: Secondary | ICD-10-CM | POA: Diagnosis not present

## 2022-04-25 DIAGNOSIS — R2681 Unsteadiness on feet: Secondary | ICD-10-CM | POA: Diagnosis not present

## 2022-04-25 DIAGNOSIS — Z743 Need for continuous supervision: Secondary | ICD-10-CM | POA: Diagnosis not present

## 2022-04-25 DIAGNOSIS — I63233 Cerebral infarction due to unspecified occlusion or stenosis of bilateral carotid arteries: Secondary | ICD-10-CM | POA: Diagnosis not present

## 2022-04-25 DIAGNOSIS — R4781 Slurred speech: Secondary | ICD-10-CM | POA: Diagnosis not present

## 2022-04-25 DIAGNOSIS — G9389 Other specified disorders of brain: Secondary | ICD-10-CM | POA: Insufficient documentation

## 2022-04-25 DIAGNOSIS — Z7982 Long term (current) use of aspirin: Secondary | ICD-10-CM | POA: Diagnosis not present

## 2022-04-25 DIAGNOSIS — I6603 Occlusion and stenosis of bilateral middle cerebral arteries: Secondary | ICD-10-CM | POA: Insufficient documentation

## 2022-04-25 DIAGNOSIS — I63213 Cerebral infarction due to unspecified occlusion or stenosis of bilateral vertebral arteries: Secondary | ICD-10-CM | POA: Diagnosis not present

## 2022-04-25 LAB — ECHOCARDIOGRAM COMPLETE
Area-P 1/2: 1.87 cm2
Calc EF: 43.6 %
S' Lateral: 3.6 cm
Single Plane A2C EF: 44.3 %
Single Plane A4C EF: 40 %
Weight: 2484.8 oz

## 2022-04-25 LAB — RESP PANEL BY RT-PCR (FLU A&B, COVID) ARPGX2
Influenza A by PCR: NEGATIVE
Influenza B by PCR: NEGATIVE
SARS Coronavirus 2 by RT PCR: NEGATIVE

## 2022-04-25 LAB — DIFFERENTIAL
Abs Immature Granulocytes: 0.01 10*3/uL (ref 0.00–0.07)
Basophils Absolute: 0.1 10*3/uL (ref 0.0–0.1)
Basophils Relative: 1 %
Eosinophils Absolute: 0.2 10*3/uL (ref 0.0–0.5)
Eosinophils Relative: 3 %
Immature Granulocytes: 0 %
Lymphocytes Relative: 27 %
Lymphs Abs: 1.9 10*3/uL (ref 0.7–4.0)
Monocytes Absolute: 0.6 10*3/uL (ref 0.1–1.0)
Monocytes Relative: 8 %
Neutro Abs: 4.4 10*3/uL (ref 1.7–7.7)
Neutrophils Relative %: 61 %

## 2022-04-25 LAB — COMPREHENSIVE METABOLIC PANEL
ALT: 11 U/L (ref 0–44)
AST: 14 U/L — ABNORMAL LOW (ref 15–41)
Albumin: 3.4 g/dL — ABNORMAL LOW (ref 3.5–5.0)
Alkaline Phosphatase: 55 U/L (ref 38–126)
Anion gap: 5 (ref 5–15)
BUN: 14 mg/dL (ref 8–23)
CO2: 24 mmol/L (ref 22–32)
Calcium: 8.6 mg/dL — ABNORMAL LOW (ref 8.9–10.3)
Chloride: 111 mmol/L (ref 98–111)
Creatinine, Ser: 1.2 mg/dL (ref 0.61–1.24)
GFR, Estimated: >60 mL/min (ref >60)
Glucose, Bld: 106 mg/dL — ABNORMAL HIGH (ref 70–99)
Potassium: 4.1 mmol/L (ref 3.5–5.1)
Sodium: 140 mmol/L (ref 135–145)
Total Bilirubin: 0.7 mg/dL (ref 0.3–1.2)
Total Protein: 6.4 g/dL — ABNORMAL LOW (ref 6.5–8.1)

## 2022-04-25 LAB — I-STAT CHEM 8, ED
BUN: 13 mg/dL (ref 8–23)
Calcium, Ion: 1.14 mmol/L — ABNORMAL LOW (ref 1.15–1.40)
Chloride: 107 mmol/L (ref 98–111)
Creatinine, Ser: 1.3 mg/dL — ABNORMAL HIGH (ref 0.61–1.24)
Glucose, Bld: 102 mg/dL — ABNORMAL HIGH (ref 70–99)
HCT: 42 % (ref 39.0–52.0)
Hemoglobin: 14.3 g/dL (ref 13.0–17.0)
Potassium: 4.1 mmol/L (ref 3.5–5.1)
Sodium: 142 mmol/L (ref 135–145)
TCO2: 24 mmol/L (ref 22–32)

## 2022-04-25 LAB — CBC
HCT: 41.5 % (ref 39.0–52.0)
Hemoglobin: 13.4 g/dL (ref 13.0–17.0)
MCH: 30.8 pg (ref 26.0–34.0)
MCHC: 32.3 g/dL (ref 30.0–36.0)
MCV: 95.4 fL (ref 80.0–100.0)
Platelets: 169 10*3/uL (ref 150–400)
RBC: 4.35 MIL/uL (ref 4.22–5.81)
RDW: 13.2 % (ref 11.5–15.5)
WBC: 7.2 10*3/uL (ref 4.0–10.5)
nRBC: 0 % (ref 0.0–0.2)

## 2022-04-25 LAB — URINALYSIS, ROUTINE W REFLEX MICROSCOPIC
Bilirubin Urine: NEGATIVE
Glucose, UA: NEGATIVE mg/dL
Hgb urine dipstick: NEGATIVE
Ketones, ur: NEGATIVE mg/dL
Leukocytes,Ua: NEGATIVE
Nitrite: NEGATIVE
Protein, ur: NEGATIVE mg/dL
Specific Gravity, Urine: 1.031 — ABNORMAL HIGH (ref 1.005–1.030)
pH: 6 (ref 5.0–8.0)

## 2022-04-25 LAB — ETHANOL: Alcohol, Ethyl (B): <10 mg/dL (ref <10)

## 2022-04-25 LAB — RAPID URINE DRUG SCREEN, HOSP PERFORMED
Amphetamines: NOT DETECTED
Barbiturates: NOT DETECTED
Benzodiazepines: NOT DETECTED
Cocaine: NOT DETECTED
Opiates: NOT DETECTED
Tetrahydrocannabinol: NOT DETECTED

## 2022-04-25 LAB — APTT: aPTT: 28 seconds (ref 24–36)

## 2022-04-25 LAB — PROTIME-INR
INR: 1 (ref 0.8–1.2)
Prothrombin Time: 13.2 seconds (ref 11.4–15.2)

## 2022-04-25 LAB — GLUCOSE, CAPILLARY: Glucose-Capillary: 96 mg/dL (ref 70–99)

## 2022-04-25 MED ORDER — ASPIRIN 325 MG PO TABS
325.0000 mg | ORAL_TABLET | Freq: Once | ORAL | Status: AC
  Administered 2022-04-25: 325 mg via ORAL
  Filled 2022-04-25: qty 1

## 2022-04-25 MED ORDER — TENECTEPLASE FOR STROKE
PACK | INTRAVENOUS | Status: AC
  Filled 2022-04-25: qty 10

## 2022-04-25 MED ORDER — IPRATROPIUM-ALBUTEROL 0.5-2.5 (3) MG/3ML IN SOLN: 3.0000 mL | Freq: Four times a day (QID) | RESPIRATORY_TRACT | Status: DC | PRN

## 2022-04-25 MED ORDER — IOHEXOL 350 MG/ML SOLN
100.0000 mL | Freq: Once | INTRAVENOUS | Status: AC | PRN
  Administered 2022-04-25: 100 mL via INTRAVENOUS

## 2022-04-25 MED ORDER — CLOPIDOGREL BISULFATE 75 MG PO TABS: 75.0000 mg | ORAL_TABLET | Freq: Every day | ORAL | Status: DC

## 2022-04-25 MED ORDER — CLOPIDOGREL BISULFATE 75 MG PO TABS
75.0000 mg | ORAL_TABLET | Freq: Every day | ORAL | Status: DC
  Administered 2022-04-25 – 2022-04-26 (×2): 75 mg via ORAL
  Filled 2022-04-25 (×2): qty 1

## 2022-04-25 MED ORDER — STROKE: EARLY STAGES OF RECOVERY BOOK
Freq: Once | Status: AC
  Filled 2022-04-25: qty 1

## 2022-04-25 MED ORDER — ASPIRIN 81 MG PO CHEW
81.0000 mg | CHEWABLE_TABLET | Freq: Every day | ORAL | Status: DC
  Administered 2022-04-26: 81 mg via ORAL
  Filled 2022-04-25: qty 1

## 2022-04-25 MED ORDER — SODIUM CHLORIDE 0.9 % IV SOLN: INTRAVENOUS | Status: AC

## 2022-04-25 MED ORDER — STROKE: EARLY STAGES OF RECOVERY BOOK
Freq: Once | Status: DC
  Filled 2022-04-25: qty 1

## 2022-04-25 MED ORDER — ACETAMINOPHEN 160 MG/5ML PO SOLN: 650.0000 mg | ORAL | Status: DC | PRN

## 2022-04-25 MED ORDER — SODIUM CHLORIDE 0.9 % BOLUS PEDS
250.0000 mL | Freq: Once | INTRAVENOUS | Status: AC
  Administered 2022-04-25: 250 mL via INTRAVENOUS

## 2022-04-25 MED ORDER — ASPIRIN 300 MG RE SUPP: 300.0000 mg | Freq: Once | RECTAL | Status: DC

## 2022-04-25 MED ORDER — HEPARIN SODIUM (PORCINE) 5000 UNIT/ML IJ SOLN
5000.0000 [IU] | Freq: Three times a day (TID) | INTRAMUSCULAR | Status: DC
Start: 2022-04-25 — End: 2022-04-26
  Administered 2022-04-25 – 2022-04-26 (×4): 5000 [IU] via SUBCUTANEOUS
  Filled 2022-04-25 (×4): qty 1

## 2022-04-25 MED ORDER — ACETAMINOPHEN 650 MG RE SUPP: 650.0000 mg | RECTAL | Status: DC | PRN

## 2022-04-25 MED ORDER — ACETAMINOPHEN 325 MG PO TABS: 650.0000 mg | ORAL_TABLET | ORAL | Status: DC | PRN

## 2022-04-25 MED ORDER — ASPIRIN 325 MG PO TABS: 325.0000 mg | ORAL_TABLET | Freq: Once | ORAL | Status: DC

## 2022-04-25 NOTE — ED Notes (Signed)
Patient in room. Patient able to voice one word answers. Patient cannot talk in complete sentences

## 2022-04-25 NOTE — ED Notes (Signed)
Patient to MRI at this time. Per Neuro hold on the TNK for now based on the MRI study.

## 2022-04-25 NOTE — H&P (Signed)
History and Physical    Timothy [name] LPF:[id number] DOB: 1946/02/26 DOA: 04/25/2022  PCP: Glenda Chroman, MD   Patient coming from: Home  Chief Complaint: AMS  HPI: Timothy [name] is a 76 y.o. 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  He was unable to communicate like he normally does and therefore EMS was called.  He was last known well at 10 PM last night when he was talking normally with his family.  Further history could not be obtained as he cannot communicate.  Son and daughter at bedside denies any current alcohol use or tobacco abuse and state that he is otherwise compliant with home medications.  He otherwise ambulates normally with no assistance.   ED Course: Vital signs with some initial tachycardia which has now resolved and blood pressures are otherwise stable.  CT head with no acute findings and no LVO noted on CTA.  MRI of the brain with findings of multifocal embolic appearing strokes that were assessed by neurology.  Review of Systems: Could not be obtained given patient condition.  Past Medical History:  Diagnosis Date   COPD (chronic obstructive pulmonary disease) ([place])    Essential hypertension    History of asbestosis 2010   History of cardiac catheterization 2012   Reportedly normal coronaries   Respiratory failure with hypoxia ([place])    Home oxygen    Past Surgical History:  Procedure Laterality Date   CARDIAC CATHETERIZATION     x 2   CHOLECYSTECTOMY     2015   CYSTOSCOPY W/ RETROGRADES Bilateral 12/17/2017   Procedure: CYSTOSCOPY WITH RETROGRADE PYELOGRAM;  Surgeon: Ceasar Mons, MD;  Location: [facility];  Service: Urology;  Laterality: Bilateral;   HEMORRHOID SURGERY     HERNIA REPAIR  2008   Abdominal    TRANSURETHRAL RESECTION OF BLADDER TUMOR N/A 12/17/2017    Procedure: TRANSURETHRAL RESECTION OF BLADDER TUMOR (TURBT);  Surgeon: Ceasar Mons, MD;  Location: [facility];  Service: Urology;  Laterality: N/A;   TRANSURETHRAL RESECTION OF BLADDER TUMOR N/A 02/11/2018   Procedure: TRANSURETHRAL RESECTION OF BLADDER TUMOR (TURBT) WITH INSTILLATION OF MITOMYCIN;  Surgeon: Ceasar Mons, MD;  Location: [facility];  Service: Urology;  Laterality: N/A;  ONLY NEEDS [place]     reports that he quit smoking about 19 years ago. His smoking use included cigarettes. He started smoking about 61 years ago. He has a 42.00 pack-year smoking history. He has never used smokeless tobacco. He reports current alcohol use of about 1.0 standard drink of alcohol per week. He reports that he does not use drugs.  Allergies  Allergen Reactions   Codeine Itching    Family History  Problem Relation Age of Onset   Lung disease Mother        Asbestosis.   Pancreatic cancer Father    Breast cancer Sister     Prior to Admission medications   Medication Sig Start Date End Date Taking? Authorizing Provider  albuterol (VENTOLIN HFA) 108 (90 Base) MCG/ACT inhaler Inhale 2 puffs into the lungs every 6 (six) hours as needed for wheezing or shortness of breath. 06/18/21  Yes Margaretha Seeds, MD  alendronate (FOSAMAX) 70 MG tablet Take 70 mg by mouth once a week. 04/22/22  Yes [provider]  citalopram (CELEXA)  20 MG tablet Take 20 mg by mouth at bedtime. 04/22/22  Yes [provider]  Fluticasone-Umeclidin-Vilant (TRELEGY ELLIPTA) 200-62.5-25 MCG/ACT AEPB Inhale 1 puff into the lungs daily at 2 PM. 04/23/22  Yes Margaretha Seeds, MD  gabapentin (NEURONTIN) 100 MG capsule Take 100 mg by mouth in the morning, at noon, and at bedtime. 02/19/22  Yes [provider]  ipratropium-albuterol (DUONEB) 0.5-2.5 (3) MG/3ML SOLN Take 3 mLs by nebulization every 6 (six) hours as needed. 11/23/21  Yes Margaretha Seeds, MD  lisinopril  (ZESTRIL) 10 MG tablet Take 10 mg by mouth daily. 06/08/21  Yes [provider]  OXYGEN 2 LPM   Yes [provider]  risperiDONE (RISPERDAL) 0.5 MG tablet Take 0.5 mg by mouth 2 (two) times daily. 12/19/21  Yes [provider]    Physical Exam: Vitals:   04/25/22 1130 04/25/22 1145 04/25/22 1200 04/25/22 1215  BP: (!) 147/73 94/74 (!) 154/82 (!) 153/78  Pulse: 69  72 71  Resp: '19 15 17 17  '$ Temp:      TempSrc:      SpO2: 98%  95% 94%  Weight:        Constitutional: NAD, calm, comfortable, nonverbal Vitals:   04/25/22 1130 04/25/22 1145 04/25/22 1200 04/25/22 1215  BP: (!) 147/73 94/74 (!) 154/82 (!) 153/78  Pulse: 69  72 71  Resp: '19 15 17 17  '$ Temp:      TempSrc:      SpO2: 98%  95% 94%  Weight:       Eyes: lids and conjunctivae normal Neck: normal, supple Respiratory: clear to auscultation bilaterally. Normal respiratory effort. No accessory muscle use.  Currently on nasal cannula oxygen. Cardiovascular: Regular rate and rhythm, no murmurs. Abdomen: no tenderness, no distention. Bowel sounds positive.  Musculoskeletal:  No edema. Skin: no rashes, lesions, ulcers.  Psychiatric: Flat affect  Labs on Admission: I have personally reviewed following labs and imaging studies  CBC: Recent Labs  Lab 04/25/22 0954 04/25/22 0956  WBC 7.2  --   NEUTROABS 4.4  --   HGB 13.4 14.3  HCT 41.5 42.0  MCV 95.4  --   PLT 169  --    Basic Metabolic Panel: Recent Labs  Lab 04/25/22 0954 04/25/22 0956  NA 140 142  K 4.1 4.1  CL 111 107  CO2 24  --   GLUCOSE 106* 102*  BUN 14 13  CREATININE 1.20 1.30*  CALCIUM 8.6*  --    GFR: Estimated Creatinine Clearance: 46.8 mL/min (A) (by C-G formula based on SCr of 1.3 mg/dL (H)). Liver Function Tests: Recent Labs  Lab 04/25/22 0954  AST 14*  ALT 11  ALKPHOS 55  BILITOT 0.7  PROT 6.4*  ALBUMIN 3.4*   No results for input(s): "LIPASE", "AMYLASE" in the last 168 hours. No results for input(s):  "AMMONIA" in the last 168 hours. Coagulation Profile: Recent Labs  Lab 04/25/22 0954  INR 1.0   Cardiac Enzymes: No results for input(s): "CKTOTAL", "CKMB", "CKMBINDEX", "TROPONINI" in the last 168 hours. BNP (last 3 results) No results for input(s): "PROBNP" in the last 8760 hours. HbA1C: No results for input(s): "HGBA1C" in the last 72 hours. CBG: No results for input(s): "GLUCAP" in the last 168 hours. Lipid Profile: No results for input(s): "CHOL", "HDL", "LDLCALC", "TRIG", "CHOLHDL", "LDLDIRECT" in the last 72 hours. Thyroid Function Tests: No results for input(s): "TSH", "T4TOTAL", "FREET4", "T3FREE", "THYROIDAB" in the last 72 hours. Anemia Panel: No results for input(s): "  VITAMINB12", "FOLATE", "FERRITIN", "TIBC", "IRON", "RETICCTPCT" in the last 72 hours. Urine analysis: No results found for: "COLORURINE", "APPEARANCEUR", "LABSPEC", "PHURINE", "GLUCOSEU", "HGBUR", "BILIRUBINUR", "KETONESUR", "PROTEINUR", "UROBILINOGEN", "NITRITE", "LEUKOCYTESUR"  Radiological Exams on Admission: CT ANGIO HEAD NECK W WO CM W PERF (CODE STROKE)  Addendum Date: 04/25/2022   ADDENDUM REPORT: 04/25/2022 11:31 ADDENDUM: Included in the distal vessel atherosclerotic irregularity, there are areas narrowing in the proximal M2 branches on both sides, left more than right. Electronically Signed   By: Nelson Chimes M.D.   On: 04/25/2022 11:31   Result Date: 04/25/2022 CLINICAL DATA:  Neuro deficit, acute, stroke suspected. EXAM: CT ANGIOGRAPHY HEAD AND NECK CT PERFUSION BRAIN TECHNIQUE: Multidetector CT imaging of the head and neck was performed using the standard protocol during bolus administration of intravenous contrast. Multiplanar CT image reconstructions and MIPs were obtained to evaluate the vascular anatomy. Carotid stenosis measurements (when applicable) are obtained utilizing NASCET criteria, using the distal internal carotid diameter as the denominator. Multiphase CT imaging of the brain was  performed following IV bolus contrast injection. Subsequent parametric perfusion maps were calculated using RAPID software. RADIATION DOSE REDUCTION: This exam was performed according to the departmental dose-optimization program which includes automated exposure control, adjustment of the mA and/or kV according to patient size and/or use of iterative reconstruction technique. CONTRAST:  137m OMNIPAQUE IOHEXOL 350 MG/ML SOLN COMPARISON:  Head CT earlier same day.  MRI 12/16/2021. FINDINGS: CTA NECK FINDINGS Aortic arch: Aortic atherosclerosis. Branching pattern is normal. Mild stenosis of the left common carotid artery and left subclavian artery origins, not flow limiting. Right carotid system: Common carotid artery widely patent to the bifurcation. Soft and calcified plaque at the carotid bifurcation and ICA bulb. No measurable stenosis of the ICA bulb. Left carotid system: Common carotid artery widely patent to the bifurcation. Soft and calcified plaque at the bifurcation and ICA bulb with irregularity. Minimal diameter of the proximal ICA measures 3 mm. Compared to a more distal cervical ICA diameter of 4 mm, there is a 25% stenosis. Vertebral arteries: Both vertebral artery origins show mild plaque but no stenosis greater than 20%. Beyond the origins the vessels are normal through the cervical region. Skeleton: Ordinary cervical spondylosis. Chronic facet fusion at C2-3. Other neck: No mass or lymphadenopathy. Upper chest: Severe emphysema. Pulmonary scarring. No active process. Review of the MIP images confirms the above findings CTA HEAD FINDINGS Anterior circulation: Both internal carotid arteries are patent through the skull base and siphon regions. There is siphon atherosclerosis but without stenosis greater than 30% on either side. The anterior and middle cerebral vessels are patent. No large vessel occlusion. No identifiable distal branch vessel occlusion. There is some distal vessel atherosclerotic  irregularity. Posterior circulation: Both vertebral arteries are patent through the foramen magnum to the basilar. No basilar stenosis. Posterior circulation branch vessels are patent. Distal PCA branch vessels show atherosclerotic irregularity. Venous sinuses: Patent and normal. Anatomic variants: None significant. Review of the MIP images confirms the above findings CT Brain Perfusion Findings: ASPECTS: 10 CBF (<30%) Volume: 4 mL Perfusion (Tmax>6.0s) volume: 5ML Mismatch Volume: 178mInfarction Location:Left frontal opercular cortex and subcortical white matter. IMPRESSION: Completed 4-5 cc infarction in the left frontal opercular cortex and underlying white matter. No intracranial large vessel occlusion or correctable proximal stenosis. Widespread distal vessel atherosclerotic irregularity. I cannot specifically identify the vessel responsible for the left frontal opercular infarction. Aortic atherosclerosis.  Emphysema. Atherosclerotic disease at both carotid bifurcations. Soft and calcified plaque. Some irregularity of the plaque  bilaterally but worse on the left could certainly serve as a source of embolic disease. No measurable stenosis on the right. 25% stenosis of the proximal ICA on the left. Electronically Signed: By: Nelson Chimes M.D. On: 04/25/2022 10:41   MR BRAIN WO CONTRAST  Result Date: 04/25/2022 CLINICAL DATA:  76 year old male code stroke presentation today. Abnormal CT Perfusion in the left hemisphere with no large vessel occlusion on CTA. EXAM: MRI HEAD WITHOUT CONTRAST TECHNIQUE: Multiplanar, multiecho pulse sequences of the brain and surrounding structures were obtained without intravenous contrast. COMPARISON:  CT head, CTA and CTP earlier today. Brain MRI 12/17/2021. FINDINGS: Brain: Cortical and subcortical restricted diffusion in the left middle frontal gyrus corresponding to the CTP abnormality (series 5, image 27). Nearby tiny focus of postcentral cortical restricted diffusion  series 5, image 29. Additionally, there is a small area of abnormal cortical diffusion in the contralateral right parietal lobe on series 5, image 24 which is in an area of encephalomalacia and not definitely restricted on ADC. And furthermore there is a punctate area of suspected restricted diffusion in the right lentiform nucleus series 5, image 18. Superimposed scattered cortical encephalomalacia in the bilateral posterior MCA and PCA territories. Mild associated hemosiderin. Patchy widely scattered bilateral cerebral white matter T2 and FLAIR hyperintensity, with occasional small cystic areas of white matter encephalomalacia (right anterior centrum semiovale series 12, image 25). Small chronic bilateral caudate lacunar infarcts. Thalami, brainstem, and cerebellum appear relatively spared - although there is a small chronic left SCA cerebellar infarct (series 10, image 6). No midline shift, mass effect, evidence of mass lesion, ventriculomegaly, extra-axial collection or acute intracranial hemorrhage. Cervicomedullary junction and pituitary are within normal limits. Vascular: Major intracranial vascular flow voids are stable since February. Skull and upper cervical spine: Negative visible cervical spine. Visualized bone marrow signal is within normal limits. Sinuses/Orbits: Stable, negative. Other: Grossly normal visible internal auditory structures. Negative visible scalp and face. IMPRESSION: 1. Acute infarct left middle frontal gyrus with no associated hemorrhage or mass effect. Punctate nearby cortical infarct in the left postcentral gyrus. 2. Punctate lacunar infarct also suspected in the right caudate nucleus. And small area of subacute ischemia in the right parietal lobe. 3. Underlying asymmetric bilateral PCA and posterior MCA territory encephalomalacia with additional chronic small vessel disease. Electronically Signed   By: Genevie Ann M.D.   On: 04/25/2022 11:10   CT HEAD CODE STROKE WO  CONTRAST  Result Date: 04/25/2022 CLINICAL DATA:  Code stroke.  Neuro deficit, acute, stroke suspected EXAM: CT HEAD WITHOUT CONTRAST TECHNIQUE: Contiguous axial images were obtained from the base of the skull through the vertex without intravenous contrast. RADIATION DOSE REDUCTION: This exam was performed according to the departmental dose-optimization program which includes automated exposure control, adjustment of the mA and/or kV according to patient size and/or use of iterative reconstruction technique. COMPARISON:  CT head February 15, 2022. FINDINGS: Brain: Similar remote infarcts in the right parietal and left occipital lobes. Similar appearance of a probable small remote infarct in the superior left cerebellum. Additional patchy white matter hypoattenuation, nonspecific but compatible with chronic microvascular ischemic disease. No evidence of acute large vascular territory infarct or acute hemorrhage. No hydrocephalus, mass lesion, midline shift, or visible extra-axial fluid collection. Vascular: No hyperdense vessel identified. Calcific intracranial atherosclerosis. Skull: No acute fracture. Sinuses/Orbits: Clear sinuses.  No acute orbital findings. Other: No mastoid effusions. ASPECTS [facility] Stroke Program Early CT Score) total score (0-10 with 10 being normal): 10. IMPRESSION: 1. No evidence  of acute intracranial abnormality. ASPECTS is 10. 2. Remote infarcts and chronic microvascular ischemic disease. Code stroke imaging results were communicated on 04/25/2022 at 10:02 am to provider Zammit via telephone, who verbally acknowledged these results. Electronically Signed   By: Margaretha Sheffield M.D.   On: 04/25/2022 10:04    EKG: Independently reviewed. SR with LBBB 76bpm.  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 for now until further SLP evaluation, maintain on IV normal saline  History of COPD with chronic hypoxemia -No acute bronchospasms noted -DuoNebs as needed for shortness of breath or wheezing  Hypertension -Hold home antihypertensives -Permissive hypertension 220/120   DVT prophylaxis: Heparin Code Status: Full Family Communication: Son and daughter at bedside 6/29 Disposition Plan:Admit for CVA workup Consults called:Neurology Admission status: Inpatient, Tele  Severity of Illness: The appropriate patient status for this patient is INPATIENT. Inpatient status is judged to be reasonable and necessary in order to provide the required intensity of service to ensure the patient's safety. The patient's presenting symptoms, physical exam findings, and initial radiographic and laboratory data in the context of their chronic comorbidities is felt to place them at high risk for further clinical deterioration. Furthermore, it is not anticipated that the patient will be medically stable for discharge from the hospital within 2 midnights of admission.   * I certify that at the point of admission it is my clinical judgment that the patient will require inpatient hospital care spanning beyond 2 midnights from the point of admission due to high intensity of service, high risk for further deterioration and high frequency of surveillance required.*   Burhan Barham D Abrianna Sidman DO [facility]  If 7PM-7AM, please contact night-coverage [email or web address]  04/25/2022, 12:44 PM

## 2022-04-25 NOTE — ED Provider Notes (Signed)
[facility] EMERGENCY DEPARTMENT Provider Note   CSN: [id number] Arrival date & time: 04/25/22  1696  An emergency department physician performed an initial assessment on this suspected stroke patient at 56.  History  Chief Complaint  Patient presents with   Aphasia    Timothy [name] is a 76 y.o. male.  Patient's daughter awoke and found her father in trouble unable to speak,.  Patient has a past medical history of hypertension  The history is provided by a relative. No language interpreter was used.  Weakness Severity:  Severe Onset quality:  Sudden Timing:  Constant Progression:  Worsening Chronicity:  New Context: not alcohol use   Relieved by:  Nothing Worsened by:  Nothing Ineffective treatments:  None tried Associated symptoms: no abdominal pain, no chest pain, no cough, no diarrhea, no frequency, no headaches and no seizures        Home Medications Prior to Admission medications   Medication Sig Start Date End Date Taking? Authorizing Provider  albuterol (VENTOLIN HFA) 108 (90 Base) MCG/ACT inhaler Inhale 2 puffs into the lungs every 6 (six) hours as needed for wheezing or shortness of breath. 06/18/21  Yes Margaretha Seeds, MD  alendronate (FOSAMAX) 70 MG tablet Take 70 mg by mouth once a week. 04/22/22  Yes [provider]  citalopram (CELEXA) 20 MG tablet Take 20 mg by mouth at bedtime. 04/22/22  Yes [provider]  Fluticasone-Umeclidin-Vilant (TRELEGY ELLIPTA) 200-62.5-25 MCG/ACT AEPB Inhale 1 puff into the lungs daily at 2 PM. 04/23/22  Yes Margaretha Seeds, MD  gabapentin (NEURONTIN) 100 MG capsule Take 100 mg by mouth in the morning, at noon, and at bedtime. 02/19/22  Yes [provider]  ipratropium-albuterol (DUONEB) 0.5-2.5 (3) MG/3ML SOLN Take 3 mLs by nebulization every 6 (six) hours as needed. 11/23/21  Yes Margaretha Seeds, MD  lisinopril (ZESTRIL) 10 MG tablet Take 10 mg by mouth daily. 06/08/21  Yes [provider]   OXYGEN 2 LPM   Yes [provider]  risperiDONE (RISPERDAL) 0.5 MG tablet Take 0.5 mg by mouth 2 (two) times daily. 12/19/21  Yes [provider]      Allergies    Codeine    Review of Systems   Review of Systems  Constitutional:  Negative for appetite change and fatigue.  HENT:  Negative for congestion, ear discharge and sinus pressure.   Eyes:  Negative for discharge.  Respiratory:  Negative for cough.   Cardiovascular:  Negative for chest pain.  Gastrointestinal:  Negative for abdominal pain and diarrhea.  Genitourinary:  Negative for frequency and hematuria.  Musculoskeletal:  Negative for back pain.  Skin:  Negative for rash.  Neurological:  Positive for weakness. Negative for seizures and headaches.  Psychiatric/Behavioral:  Negative for hallucinations.     Physical Exam Updated Vital Signs BP 94/74   Pulse 69   Temp (!) 97.5 F (36.4 C) (Oral)   Resp 15   Wt 71.9 kg   SpO2 98%   BMI 24.10 kg/m  Physical Exam Vitals and nursing note reviewed.  Constitutional:      Appearance: He is well-developed.  HENT:     Head: Normocephalic.     Nose: Nose normal.  Eyes:     General: No scleral icterus.    Conjunctiva/sclera: Conjunctivae normal.  Neck:     Thyroid: No thyromegaly.  Cardiovascular:     Rate and Rhythm: Normal rate and regular rhythm.     Heart sounds: No murmur  heard.    No friction rub. No gallop.  Pulmonary:     Breath sounds: No stridor. No wheezing or rales.  Chest:     Chest wall: No tenderness.  Abdominal:     General: There is no distension.     Tenderness: There is no abdominal tenderness. There is no rebound.  Musculoskeletal:        General: Normal range of motion.     Cervical back: Neck supple.  Lymphadenopathy:     Cervical: No cervical adenopathy.  Skin:    Findings: No erythema or rash.  Neurological:     Mental Status: He is alert.     Motor: No abnormal muscle tone.     Coordination: Coordination normal.      Comments: Patient unable to speak, seems to understand  Psychiatric:        Behavior: Behavior normal.     ED Results / Procedures / Treatments   Labs (all labs ordered are listed, but only abnormal results are displayed) Labs Reviewed  COMPREHENSIVE METABOLIC PANEL - Abnormal; Notable for the following components:      Result Value   Glucose, Bld 106 (*)    Calcium 8.6 (*)    Total Protein 6.4 (*)    Albumin 3.4 (*)    AST 14 (*)    All other components within normal limits  I-STAT CHEM 8, ED - Abnormal; Notable for the following components:   Creatinine, Ser 1.30 (*)    Glucose, Bld 102 (*)    Calcium, Ion 1.14 (*)    All other components within normal limits  RESP PANEL BY RT-PCR (FLU A&B, COVID) ARPGX2  ETHANOL  PROTIME-INR  APTT  CBC  DIFFERENTIAL  RAPID URINE DRUG SCREEN, HOSP PERFORMED  URINALYSIS, ROUTINE W REFLEX MICROSCOPIC  HEMOGLOBIN A1C    EKG None  Radiology CT ANGIO HEAD NECK W WO CM W PERF (CODE STROKE)  Addendum Date: 04/25/2022   ADDENDUM REPORT: 04/25/2022 11:31 ADDENDUM: Included in the distal vessel atherosclerotic irregularity, there are areas narrowing in the proximal M2 branches on both sides, left more than right. Electronically Signed   By: Nelson Chimes M.D.   On: 04/25/2022 11:31   Result Date: 04/25/2022 CLINICAL DATA:  Neuro deficit, acute, stroke suspected. EXAM: CT ANGIOGRAPHY HEAD AND NECK CT PERFUSION BRAIN TECHNIQUE: Multidetector CT imaging of the head and neck was performed using the standard protocol during bolus administration of intravenous contrast. Multiplanar CT image reconstructions and MIPs were obtained to evaluate the vascular anatomy. Carotid stenosis measurements (when applicable) are obtained utilizing NASCET criteria, using the distal internal carotid diameter as the denominator. Multiphase CT imaging of the brain was performed following IV bolus contrast injection. Subsequent parametric perfusion maps were calculated  using RAPID software. RADIATION DOSE REDUCTION: This exam was performed according to the departmental dose-optimization program which includes automated exposure control, adjustment of the mA and/or kV according to patient size and/or use of iterative reconstruction technique. CONTRAST:  157m OMNIPAQUE IOHEXOL 350 MG/ML SOLN COMPARISON:  Head CT earlier same day.  MRI 12/16/2021. FINDINGS: CTA NECK FINDINGS Aortic arch: Aortic atherosclerosis. Branching pattern is normal. Mild stenosis of the left common carotid artery and left subclavian artery origins, not flow limiting. Right carotid system: Common carotid artery widely patent to the bifurcation. Soft and calcified plaque at the carotid bifurcation and ICA bulb. No measurable stenosis of the ICA bulb. Left carotid system: Common carotid artery widely patent to the bifurcation. Soft and calcified plaque  at the bifurcation and ICA bulb with irregularity. Minimal diameter of the proximal ICA measures 3 mm. Compared to a more distal cervical ICA diameter of 4 mm, there is a 25% stenosis. Vertebral arteries: Both vertebral artery origins show mild plaque but no stenosis greater than 20%. Beyond the origins the vessels are normal through the cervical region. Skeleton: Ordinary cervical spondylosis. Chronic facet fusion at C2-3. Other neck: No mass or lymphadenopathy. Upper chest: Severe emphysema. Pulmonary scarring. No active process. Review of the MIP images confirms the above findings CTA HEAD FINDINGS Anterior circulation: Both internal carotid arteries are patent through the skull base and siphon regions. There is siphon atherosclerosis but without stenosis greater than 30% on either side. The anterior and middle cerebral vessels are patent. No large vessel occlusion. No identifiable distal branch vessel occlusion. There is some distal vessel atherosclerotic irregularity. Posterior circulation: Both vertebral arteries are patent through the foramen magnum to the  basilar. No basilar stenosis. Posterior circulation branch vessels are patent. Distal PCA branch vessels show atherosclerotic irregularity. Venous sinuses: Patent and normal. Anatomic variants: None significant. Review of the MIP images confirms the above findings CT Brain Perfusion Findings: ASPECTS: 10 CBF (<30%) Volume: 4 mL Perfusion (Tmax>6.0s) volume: 5ML Mismatch Volume: 26m Infarction Location:Left frontal opercular cortex and subcortical white matter. IMPRESSION: Completed 4-5 cc infarction in the left frontal opercular cortex and underlying white matter. No intracranial large vessel occlusion or correctable proximal stenosis. Widespread distal vessel atherosclerotic irregularity. I cannot specifically identify the vessel responsible for the left frontal opercular infarction. Aortic atherosclerosis.  Emphysema. Atherosclerotic disease at both carotid bifurcations. Soft and calcified plaque. Some irregularity of the plaque bilaterally but worse on the left could certainly serve as a source of embolic disease. No measurable stenosis on the right. 25% stenosis of the proximal ICA on the left. Electronically Signed: By: MNelson ChimesM.D. On: 04/25/2022 10:41   MR BRAIN WO CONTRAST  Result Date: 04/25/2022 CLINICAL DATA:  75year old male code stroke presentation today. Abnormal CT Perfusion in the left hemisphere with no large vessel occlusion on CTA. EXAM: MRI HEAD WITHOUT CONTRAST TECHNIQUE: Multiplanar, multiecho pulse sequences of the brain and surrounding structures were obtained without intravenous contrast. COMPARISON:  CT head, CTA and CTP earlier today. Brain MRI 12/17/2021. FINDINGS: Brain: Cortical and subcortical restricted diffusion in the left middle frontal gyrus corresponding to the CTP abnormality (series 5, image 27). Nearby tiny focus of postcentral cortical restricted diffusion series 5, image 29. Additionally, there is a small area of abnormal cortical diffusion in the contralateral  right parietal lobe on series 5, image 24 which is in an area of encephalomalacia and not definitely restricted on ADC. And furthermore there is a punctate area of suspected restricted diffusion in the right lentiform nucleus series 5, image 18. Superimposed scattered cortical encephalomalacia in the bilateral posterior MCA and PCA territories. Mild associated hemosiderin. Patchy widely scattered bilateral cerebral white matter T2 and FLAIR hyperintensity, with occasional small cystic areas of white matter encephalomalacia (right anterior centrum semiovale series 12, image 25). Small chronic bilateral caudate lacunar infarcts. Thalami, brainstem, and cerebellum appear relatively spared - although there is a small chronic left SCA cerebellar infarct (series 10, image 6). No midline shift, mass effect, evidence of mass lesion, ventriculomegaly, extra-axial collection or acute intracranial hemorrhage. Cervicomedullary junction and pituitary are within normal limits. Vascular: Major intracranial vascular flow voids are stable since February. Skull and upper cervical spine: Negative visible cervical spine. Visualized bone marrow signal is within  normal limits. Sinuses/Orbits: Stable, negative. Other: Grossly normal visible internal auditory structures. Negative visible scalp and face. IMPRESSION: 1. Acute infarct left middle frontal gyrus with no associated hemorrhage or mass effect. Punctate nearby cortical infarct in the left postcentral gyrus. 2. Punctate lacunar infarct also suspected in the right caudate nucleus. And small area of subacute ischemia in the right parietal lobe. 3. Underlying asymmetric bilateral PCA and posterior MCA territory encephalomalacia with additional chronic small vessel disease. Electronically Signed   By: Genevie Ann M.D.   On: 04/25/2022 11:10   CT HEAD CODE STROKE WO CONTRAST  Result Date: 04/25/2022 CLINICAL DATA:  Code stroke.  Neuro deficit, acute, stroke suspected EXAM: CT HEAD  WITHOUT CONTRAST TECHNIQUE: Contiguous axial images were obtained from the base of the skull through the vertex without intravenous contrast. RADIATION DOSE REDUCTION: This exam was performed according to the departmental dose-optimization program which includes automated exposure control, adjustment of the mA and/or kV according to patient size and/or use of iterative reconstruction technique. COMPARISON:  CT head February 15, 2022. FINDINGS: Brain: Similar remote infarcts in the right parietal and left occipital lobes. Similar appearance of a probable small remote infarct in the superior left cerebellum. Additional patchy white matter hypoattenuation, nonspecific but compatible with chronic microvascular ischemic disease. No evidence of acute large vascular territory infarct or acute hemorrhage. No hydrocephalus, mass lesion, midline shift, or visible extra-axial fluid collection. Vascular: No hyperdense vessel identified. Calcific intracranial atherosclerosis. Skull: No acute fracture. Sinuses/Orbits: Clear sinuses.  No acute orbital findings. Other: No mastoid effusions. ASPECTS [facility] Stroke Program Early CT Score) total score (0-10 with 10 being normal): 10. IMPRESSION: 1. No evidence of acute intracranial abnormality. ASPECTS is 10. 2. Remote infarcts and chronic microvascular ischemic disease. Code stroke imaging results were communicated on 04/25/2022 at 10:02 am to provider Geneieve Duell via telephone, who verbally acknowledged these results. Electronically Signed   By: Margaretha Sheffield M.D.   On: 04/25/2022 10:04    Procedures Procedures    Medications Ordered in ED Medications  tenecteplase (TNKASE) 50 MG injection for Stroke (  Not Given 04/25/22 1110)   stroke: early stages of recovery book (has no administration in time range)  aspirin suppository 300 mg (has no administration in time range)    Or  aspirin tablet 325 mg (has no administration in time range)  aspirin chewable tablet 81 mg (has no  administration in time range)  iohexol (OMNIPAQUE) 350 MG/ML injection 100 mL (100 mLs Intravenous Contrast Given 04/25/22 1013)  0.9% NaCl bolus PEDS (0 mLs Intravenous Stopped 04/25/22 1104)    ED Course/ Medical Decision Making/ A&P  CRITICAL CARE Performed by: Milton Ferguson Total critical care time: 40 minutes Critical care time was exclusive of separately billable procedures and treating other patients. Critical care was necessary to treat or prevent imminent or life-threatening deterioration. Critical care was time spent personally by me on the following activities: development of treatment plan with patient and/or surrogate as well as nursing, discussions with consultants, evaluation of patient's response to treatment, examination of patient, obtaining history from patient or surrogate, ordering and performing treatments and interventions, ordering and review of laboratory studies, ordering and review of radiographic studies, pulse oximetry and re-evaluation of patient's condition.                          Medical Decision Making Amount and/or Complexity of Data Reviewed Labs: ordered. Radiology: ordered.  Risk Decision regarding hospitalization.  This  patient presents to the ED for concern of stroke, this involves an extensive number of treatment options, and is a complaint that carries with it a high risk of complications and morbidity.  The differential diagnosis includes ischemic stroke, hemorrhagic stroke   Co morbidities that complicate the patient evaluation  Hypertension   Additional history obtained:  Additional history obtained from family External records from outside source obtained and reviewed including hospital records   Lab Tests:  I Ordered, and personally interpreted labs.  The pertinent results include: Chemistry showed creatinine 1.3 CBC normal   Imaging Studies ordered:  I ordered imaging studies including CT head I independently visualized and  interpreted imaging which showed no acute stroke I agree with the radiologist interpretation   Cardiac Monitoring: / EKG:  The patient was maintained on a cardiac monitor.  I personally viewed and interpreted the cardiac monitored which showed an underlying rhythm of: Normal sinus rhythm   Consultations Obtained:  I requested consultation with the neurology and hospitalist,  and discussed lab and imaging findings as well as pertinent plan - they recommend: Admission for stroke work-up    Problem List / ED Course / Critical interventions / Medication management  Inability to speak I ordered medication including aspirin Reevaluation of the patient after these medicines showed that the patient stayed the same I have reviewed the patients home medicines and have made adjustments as needed   Social Determinants of Health:  None   Test / Admission - Considered: MRI of the brain   Patient with an acute stroke.  Neurology is consulted and he will be admitted to medicine        Final Clinical Impression(s) / ED Diagnoses Final diagnoses:  Cerebrovascular accident (CVA) due to thrombosis of precerebral artery [facility])    Rx / DC Orders ED Discharge Orders     None         Milton Ferguson, MD 04/26/22 1719

## 2022-04-25 NOTE — ED Triage Notes (Signed)
Pt brought in by [facility] from home with c/o sudden onset of difficulty speaking and not following commands. EMS reports pt has started following commands on the way to the hospital. LKW 0915. Code Stroke activated in the field.

## 2022-04-25 NOTE — ED Notes (Signed)
Per neuro have TNK at bedside, however await orders to give TNK.

## 2022-04-25 NOTE — ED Notes (Signed)
Awaiting neurologists decision. Continued assessment.

## 2022-04-25 NOTE — ED Notes (Signed)
Patient to CT.

## 2022-04-25 NOTE — ED Notes (Signed)
Patient in the room at this time

## 2022-04-25 NOTE — ED Notes (Addendum)
Per neuro do NOT give TNK at this time. NIHSS Q2h per neuro

## 2022-04-25 NOTE — ED Notes (Signed)
Per Neuro, order CT with perfusion; awaiting TNK decision.

## 2022-04-25 NOTE — Progress Notes (Signed)
*  PRELIMINARY RESULTS* Echocardiogram 2D Echocardiogram has been performed.  Timothy [name] 04/25/2022, 3:16 PM

## 2022-04-25 NOTE — Consult Note (Signed)
[facility] Telemedicine Consult   Requesting Provider: Milton Timothy [name] Consult Participants: Myself, bedside nurse, family by phone, atrium nurse Location of the provider: [facility] Location of the patient: Timothy Timothy [name]  This consult was provided via telemedicine with 2-way video and audio communication. The patient/family was informed that care would be provided in this way and agreed to receive care in this manner.    Chief Complaint: Altered mental status   HPI: Mr. Timothy Timothy [name]. Timothy Timothy [name] is a 76 year old man with a past medical history significant for recent stroke (February 2023, scattered punctate left MCA distribution infarcts, no clear residual), hypertension, COPD on home oxygen,  He was last in his normal state of health last night at 10 PM when he was talking with his family.  At this morning his son heard him stumbling around and saying ow, and when he went to check on him the patient was unable to communicate for which EMS was activated  During his last stroke he also presented with confusion/difficulty speaking, and ultimately MRI brain revealed 3 punctate infarcts in the left MCA distribution   LKW: 10 PM last night (sx discovered at 9:15) tpa given?: No, out of the window and right parietal lobe stroke has matched FLAIR changes  IR Thrombectomy? No, no LVO  Modified Rankin Scale: 2-Slight disability-UNABLE to perform all activities but does not need assistance secondary to his COPD Time of teleneurologist evaluation: 9:58 AM  Exam: Current vital signs: BP (!) 154/74   Pulse (!) 140   Temp (!) 97.5 F (36.4 C) (Oral)   Resp 19   Wt 71.9 kg   SpO2 98%   BMI 24.10 kg/m  Vital signs in last 24 hours: Temp:  [97.5 F (36.4 C)] 97.5 F (36.4 C) (06/29 0956) Pulse Rate:  [67-140] 140 (06/29 1030) Resp:  [12-23] 19 (06/29 1030) BP: (151-159)/(74-90) 154/74 (06/29 1025) SpO2:  [93 %-98 %] 98 % (06/29 1030) Weight:  [71.9 kg] 71.9 kg (06/29 1005)    General:  Frustrated by inability to communicate, otherwise no acute distress  Pulmonary: breathing comfortably Cardiac: regular rate and rhythm on monitor   NIH Stroke scale 1A: Level of Consciousness - 0 1B: Ask Month and Age - 2 1C: 'Blink Eyes' & 'Squeeze Hands' - 2 2: Test Horizontal Extraocular Movements - 0 3: Test Visual Fields - 0 4: Test Facial Palsy - 1 right facial droop 5A: Test Left Arm Motor Drift - 0 5B: Test Right Arm Motor Drift - 0 6A: Test Left Leg Motor Drift - 1 6B: Test Right Leg Motor Drift - 1 7: Test Limb Ataxia - 0 8: Test Sensation - 0 9: Test Language/Aphasia- 2 10: Test Dysarthria - 1 11: Test Extinction/Inattention - 0 NIHSS score: 10   Imaging Reviewed:  Head CT personally reviewed, agree with radiology there is no acute intracranial process CTA personally reviewed, no LVO CT perfusion personally reviewed, matching core and penumbra in the left operculum (though this can represent ghost core) MRI brain personally reviewed, while the left operculum restricted diffusion lesion does not have a well matched FLAIR change there are some chronic microvascular changes in that area and additionally there is a right parietal restricted diffusion lesion that does have a well matched FLAIR change on my review, making the patient not a candidate for intervention --full radiology report pending  Labs reviewed in epic and pertinent values follow:  Basic Metabolic Panel: Recent Labs  Lab 04/25/22 0954 04/25/22 0956  Timothy [name] 140 142  K  4.1 4.1  CL 111 107  CO2 24  --   GLUCOSE 106* 102*  BUN 14 13  CREATININE 1.20 1.30*  CALCIUM 8.6*  --     CBC: Recent Labs  Lab 04/25/22 0954 04/25/22 0956  WBC 7.2  --   NEUTROABS 4.4  --   HGB 13.4 14.3  HCT 41.5 42.0  MCV 95.4  --   PLT 169  --     Coagulation Studies: Recent Labs    04/25/22 0954  LABPROT 13.2  INR 1.0       Assessment: Strokes in multiple vascular distributions concerning for central embolic  source.  He does have significant aortic arch atherosclerosis as well as bilateral MCA stenoses.  In this setting I would favor dual antiplatelet therapy for 90 days, if no indication for anticoagulation is found  Recommendations:  # Multifocal embolic appearing strokes, cardioembolic versus atheroembolic from aortic arch - Stroke labs HgbA1c, fasting lipid panel - Frequent neuro checks - Echocardiogram - No need for carotid dopplers as patient has had already had CTA - Prophylactic therapy-Antiplatelet med: Aspirin - dose '325mg'$  PO or '300mg'$  PR, followed by 81 mg daily - Continue home Plavix 75 mg daily - If an indication for anticoagulation is found, would discontinue antiplatelets unless he has an indication from a cardiac perspective for continuing aspirin 81 mg (coronary artery disease) - Risk factor modification - Telemetry monitoring; 30 day event monitor on discharge if no arrythmias captured or if no other indication for long-term anticoagulation is detected - Blood pressure goal   - Permissive hypertension to 220/120  - PT consult, OT consult, Speech consult - Dr. Hortense Ramal to follow   This patient is receiving care for possible acute neurological changes. There was 60 minutes of care by this provider at the time of service, including time for direct evaluation via telemedicine, review of medical records, imaging studies and discussion of findings with providers, the patient and/or family.  Consult level time thresholds, 20 min (1), 40 min (2), 55 min (3), 80 min (4), 110 min (5)  Timothy Noe MD-PhD [facility] [phone] If 8pm-8am, please page neurology on call as listed in [place].  CRITICAL CARE Performed by: Timothy Timothy [name]   Total critical care time: 60 minutes  Critical care time was exclusive of separately billable procedures and treating other patients.  Critical care was necessary to treat or prevent imminent or life-threatening  deterioration.  Critical care was time spent personally by me on the following activities: development of treatment plan with patient and/or surrogate as well as nursing, discussions with consultants, evaluation of patient's response to treatment, examination of patient, obtaining history from patient or surrogate, ordering and performing treatments and interventions, ordering and review of laboratory studies, ordering and review of radiographic studies, pulse oximetry and re-evaluation of patient's condition.

## 2022-04-25 NOTE — ED Notes (Signed)
[facility]  called CODE STROKE beeped and called CT and labe @ 5329

## 2022-04-25 NOTE — Progress Notes (Addendum)
SLP Cancellation Note  Patient Details Name: Timothy [name] MRN: [id number] DOB: 1945-12-06   Cancelled treatment:       Reason Eval/Treat Not Completed: Patient at procedure or test/unavailable (Will check back for SLE needs tomorrow.) Pt passed the [place] swallow screen and diet was started.   Thank you,  Genene Churn, [place]  [place] 04/25/2022, 3:30 PM

## 2022-04-25 NOTE — Progress Notes (Signed)
Code Stroke activated @ 0942-EMS pre-alert.  Pt arrived by EMS @ [phone].  LKWT originally [phone], but after speaking with family, LKWT unknown. [facility] paged @ [phone] and Bhagat on camera @ [phone].  NCC neg.  Sent back for advanced imaging.

## 2022-04-26 ENCOUNTER — Telehealth: Payer: Self-pay | Admitting: *Deleted

## 2022-04-26 DIAGNOSIS — I639 Cerebral infarction, unspecified: Secondary | ICD-10-CM

## 2022-04-26 LAB — LIPID PANEL
Cholesterol: 184 mg/dL (ref 0–200)
HDL: 41 mg/dL (ref >40)
LDL Cholesterol: 131 mg/dL — ABNORMAL HIGH (ref 0–99)
Total CHOL/HDL Ratio: 4.5 RATIO
Triglycerides: 59 mg/dL (ref <150)
VLDL: 12 mg/dL (ref 0–40)

## 2022-04-26 LAB — COMPREHENSIVE METABOLIC PANEL
ALT: 9 U/L (ref 0–44)
AST: 13 U/L — ABNORMAL LOW (ref 15–41)
Albumin: 3 g/dL — ABNORMAL LOW (ref 3.5–5.0)
Alkaline Phosphatase: 47 U/L (ref 38–126)
Anion gap: 4 — ABNORMAL LOW (ref 5–15)
BUN: 11 mg/dL (ref 8–23)
CO2: 25 mmol/L (ref 22–32)
Calcium: 8.5 mg/dL — ABNORMAL LOW (ref 8.9–10.3)
Chloride: 111 mmol/L (ref 98–111)
Creatinine, Ser: 0.93 mg/dL (ref 0.61–1.24)
GFR, Estimated: >60 mL/min (ref >60)
Glucose, Bld: 98 mg/dL (ref 70–99)
Potassium: 4.1 mmol/L (ref 3.5–5.1)
Sodium: 140 mmol/L (ref 135–145)
Total Bilirubin: 0.6 mg/dL (ref 0.3–1.2)
Total Protein: 5.9 g/dL — ABNORMAL LOW (ref 6.5–8.1)

## 2022-04-26 LAB — CBC
HCT: 39.7 % (ref 39.0–52.0)
Hemoglobin: 12.7 g/dL — ABNORMAL LOW (ref 13.0–17.0)
MCH: 30.6 pg (ref 26.0–34.0)
MCHC: 32 g/dL (ref 30.0–36.0)
MCV: 95.7 fL (ref 80.0–100.0)
Platelets: 160 10*3/uL (ref 150–400)
RBC: 4.15 MIL/uL — ABNORMAL LOW (ref 4.22–5.81)
RDW: 13.1 % (ref 11.5–15.5)
WBC: 5.7 10*3/uL (ref 4.0–10.5)
nRBC: 0 % (ref 0.0–0.2)

## 2022-04-26 LAB — HEMOGLOBIN A1C
Hgb A1c MFr Bld: 5.4 % (ref 4.8–5.6)
Mean Plasma Glucose: 108 mg/dL

## 2022-04-26 MED ORDER — ASPIRIN 81 MG PO CHEW: 81.0000 mg | CHEWABLE_TABLET | Freq: Every day | ORAL | 2 refills | Status: AC

## 2022-04-26 MED ORDER — ATORVASTATIN CALCIUM 40 MG PO TABS: 40.0000 mg | ORAL_TABLET | Freq: Every day | ORAL | 11 refills | Status: DC

## 2022-04-26 MED ORDER — ATORVASTATIN CALCIUM 40 MG PO TABS
40.0000 mg | ORAL_TABLET | Freq: Every day | ORAL | Status: DC
  Administered 2022-04-26: 40 mg via ORAL
  Filled 2022-04-26: qty 1

## 2022-04-26 MED ORDER — ATORVASTATIN CALCIUM 40 MG PO TABS: 40.0000 mg | ORAL_TABLET | Freq: Every day | ORAL | 2 refills | Status: DC

## 2022-04-26 NOTE — Progress Notes (Signed)
I connected with  Timothy [name] on 04/26/22 by a video enabled telemedicine application and verified that I am speaking with the correct person using two identifiers.   I discussed the limitations of evaluation and management by telemedicine. The patient expressed understanding and agreed to proceed.  Location of patient: [facility] Location of physician: [facility]  Subjective: No acute events overnight.  Continues to have word finding difficulties and trouble having a conversation.  ROS: negative except above  Examination  Vital signs in last 24 hours: Temp:  [97.5 F (36.4 C)-98.4 F (36.9 C)] 98 F (36.7 C) (06/30 0603) Pulse Rate:  [67-81] 74 (06/30 0603) Resp:  [14-19] 19 (06/30 0603) BP: (94-160)/(70-82) 139/78 (06/30 0603) SpO2:  [94 %-100 %] 97 % (06/30 0603) Weight:  [70.4 kg] 70.4 kg (06/29 1353)  General: lying in bed, NAD Neuro: MS: Alert, oriented x 3, follows commands, able to name objects but has trouble holding a full conversation CN: pupils equal and reactive,  EOMI, face symmetric, tongue midline, normal sensation over face, Motor: antigravity strength without drift in all 4 extremities  Basic Metabolic Panel: Recent Labs  Lab 04/25/22 0954 04/25/22 0956 04/26/22 0601  NA 140 142 140  K 4.1 4.1 4.1  CL 111 107 111  CO2 24  --  25  GLUCOSE 106* 102* 98  BUN '14 13 11  '$ CREATININE 1.20 1.30* 0.93  CALCIUM 8.6*  --  8.5*    CBC: Recent Labs  Lab 04/25/22 0954 04/25/22 0956 04/26/22 0601  WBC 7.2  --  5.7  NEUTROABS 4.4  --   --   HGB 13.4 14.3 12.7*  HCT 41.5 42.0 39.7  MCV 95.4  --  95.7  PLT 169  --  160     Coagulation Studies: Recent Labs    04/25/22 0954  LABPROT 13.2  INR 1.0    Imaging  MR brain wo contrast 04/25/2022: Acute infarct left middle frontal gyrus with no associated hemorrhage or mass effect. Punctate nearby cortical infarct in the left postcentral gyrus. Punctate lacunar infarct also suspected in  the right caudate nucleus. And small area of subacute ischemia in the right parietal lobe. Underlying asymmetric bilateral PCA and posterior MCA territory encephalomalacia with additional chronic small vessel disease.  CTA head and neck 04/25/2022: No intracranial large vessel occlusion or correctable proximal stenosis. Widespread distal vessel atherosclerotic irregularity. I cannot specifically identify the vessel responsible for the left frontal opercular infarction. Aortic atherosclerosis.  Emphysema. Atherosclerotic disease at both carotid bifurcations. Soft and calcified plaque. Some irregularity of the plaque bilaterally but worse on the left could certainly serve as a source of embolic disease. No measurable stenosis on the right. 25% stenosis of the proximal ICA on the left.Included in the distal vessel atherosclerotic irregularity, there are areas narrowing in the proximal M2 branches on both sides, left more than right.  ASSESSMENT AND PLAN: 76yo M with strokes in multiple vascular distributions concerning for central embolic source.  He does have significant aortic arch atherosclerosis as well as bilateral MCA stenoses.   Acute ischemic stroke Intracranial stenosis -Etiology: Embolic, cardioembolic versus due to intracranial stenosis  Recommendations -Recommend aspirin 81 mg daily plus Plavix and 5 mg daily for 3 months followed by continue home Plavix 75 mg daily. -Recommend atorvastatin 40 mg daily -Recommend 30-day event monitor at the time of discharge -Stroke education including BEFAST -PT/OT/SLP -Recommend follow-up with neurology in 3 months  I have spent a total of  36  minutes with the patient reviewing hospital notes,  test results, labs and examining the patient as well as establishing an assessment and plan that was discussed personally with the patient.  > 50% of time was spent in direct patient care.   Zeb Comfort Epilepsy [facility] For questions  after 5pm please refer to AMION to reach the Neurologist on call

## 2022-04-26 NOTE — TOC Transition Note (Signed)
Transition of Care [facility]) - CM/SW Discharge Note   Patient Details  Name: Timothy [name] MRN: [id number] Date of Birth: 1945-11-02  Transition of Care [facility] CM/SW Contact:  Boneta Lucks, RN Phone Number: 04/26/2022, 11:54 AM   Clinical Narrative:   Patient discharging home, lives with his wife. Uses a scooter or walks very slow, due to breathing. He is on 2L home oxygen. Wife unsure which company he uses. They pick up portable canister for his machine. She works and provides him transportation to appointments. She is agreeable to home health. PT/OT ordered, Georgina Snell with [facility] accepted.    Final next level of care: [place] Barriers to Discharge: Barriers Resolved  Patient Goals and CMS Choice Patient states their goals for this hospitalization and ongoing recovery are:: to go home. CMS Medicare.gov Compare Post Acute Care list provided to:: Patient Represenative (must comment) Choice offered to / list presented to : Spouse  Discharge Placement           Patient to be transferred to facility by: wife Name of family member notified: Wife- Tomi Bamberger Patient and family notified of of transfer: 04/26/22  Discharge Plan and Services    HH Arranged: PT, OT [facility] Agency: [place] Date [place]: 04/26/22 Time [place]: [place] Representative spoke with at [place]: Georgina Snell  Readmission Risk Interventions    04/26/2022   11:53 AM  Readmission Risk Prevention Plan  Post Dischage Appt Complete  Medication Screening Complete  Transportation Screening Complete

## 2022-04-26 NOTE — Evaluation (Signed)
Physical Therapy Evaluation Patient Details Name: Timothy [name] MRN: [id number] DOB: 1946-02-20 Today's Date: 04/26/2022  History of Present Illness  Timothy [name] is a 76 y.o. 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  He was unable to communicate like he normally does and therefore EMS was called.  He was last known well at 10 PM last night when he was talking normally with his family.  Further history could not be obtained as he cannot communicate.  Son and daughter at bedside denies any current alcohol use or tobacco abuse and state that he is otherwise compliant with home medications.  He otherwise ambulates normally with no assistance.   Clinical Impression  Patient presents supine in bed on 2 LPM and consents to PT/OT co-evaluation. Patient maintained on 2 LPM throughout evaluation. Subjective history obtained through yes/no questioning with patient demonstrating difficulty with expressive communication. Patient is independent with bed mobility demonstrating good trunk control and appropriate strength. No significant findings with LE neurological screening, refer to OT evaluation in relation to UE neurological screening. Patient is independent with transfers with smooth movement and ability to maintain upright position. Patient was able to ambulate 200 feet independently in hallway with no AD. Fair balance and coordination observed. Patient able to ambulate incline/decline surfaces indicating good return for home. Gait speed close to WNL along with gait pattern WFL with minor gait deviations. Primarily limited by fatigue with need of frequent breaks due to dyspnea on exertion. Vitals monitored during gait with SpO2 averaging 98% and dropping to 89% at the lowest. Patient tolerated sitting up in chair after therapy. Patient  discharged to care of nursing for ambulation daily as tolerated for length of stay.      Recommendations for follow up therapy are one component of a multi-disciplinary discharge planning process, led by the attending physician.  Recommendations may be updated based on patient status, additional functional criteria and insurance authorization.  Follow Up Recommendations No PT follow up      Assistance Recommended at Discharge PRN  Patient can return home with the following  A little help with walking and/or transfers;Help with stairs or ramp for entrance;Assistance with cooking/housework    Equipment Recommendations None recommended by PT  Recommendations for Other Services       Functional Status Assessment Patient has not had a recent decline in their functional status     Precautions / Restrictions Precautions Precautions: Fall Restrictions Weight Bearing Restrictions: No      Mobility  Bed Mobility Overal bed mobility: Independent             General bed mobility comments: Independent with supine to sit. Good trunk control and appropriate strength    Transfers Overall transfer level: Independent Equipment used: None               General transfer comment: Independent with STS with smooth intitiation and movement    Ambulation/Gait Ambulation/Gait assistance: Independent Gait Distance (Feet): 200 Feet Assistive device: None Gait Pattern/deviations: Decreased step length - left, Decreased step length - right, Decreased stride length, Decreased dorsiflexion - left, Decreased dorsiflexion - right Gait velocity: slightly decreased     General Gait Details: Able to ambulate 200 feet in hallway independently and no AD. Fair balance and coordination while maintaining 2 LPM. Primarily limited by fatigue with need of  frequent breaks. Vitals monitored  Stairs            Wheelchair Mobility    Modified Rankin (Stroke Patients Only)       Balance  Overall balance assessment: Independent                                           Pertinent Vitals/Pain Pain Assessment Pain Assessment: No/denies pain    Home Living Family/patient expects to be discharged to:: Private residence Living Arrangements: Children Available Help at Discharge: Family;Available 24 hours/day Type of Home: Other(Comment) Home Access: Level entry       Home Layout: One level Home Equipment: None Additional Comments: Pt on 2L O2 at baseline. Info per pt report answering yes and no questions.    Prior Function Prior Level of Function : Needs assist             Mobility Comments: Community ambulator without AD. Currently not driving ADLs Comments: Indepndent ADL's; able to cook and do some cleaning; reported that someone gets groceries for him. Pt does not drive.     Hand Dominance   Dominant Hand: Right    Extremity/Trunk Assessment   Upper Extremity Assessment Upper Extremity Assessment: Defer to OT evaluation RUE Deficits / Details: Mild weakness with only noted difference with R UE elbow extension which was at 4/5. Pt 4+/5 grossly other than this. RUE Sensation: WNL RUE Coordination: WNL LUE Deficits / Details: 4+/5 grossly LUE Sensation: WNL LUE Coordination: decreased gross motor (Pt was unable to touch nose with L UE with eyes closed. Pt touching just under eye consistently.)    Lower Extremity Assessment Lower Extremity Assessment: Overall WFL for tasks assessed    Cervical / Trunk Assessment Cervical / Trunk Assessment: Normal  Communication   Communication: Expressive difficulties  Cognition Arousal/Alertness: Awake/alert Behavior During Therapy: WFL for tasks assessed/performed Overall Cognitive Status: Within Functional Limits for tasks assessed                                 General Comments: Pt able to answer yes and no questions today. Trouble with other verbal communication.         General Comments      Exercises     Assessment/Plan    PT Assessment Patient does not need any further PT services  PT Problem List         PT Treatment Interventions      PT Goals (Current goals can be found in the Care Plan section)  Acute Rehab PT Goals Patient Stated Goal: return home PT Goal Formulation: With patient Time For Goal Achievement: 04/26/22 Potential to Achieve Goals: Good    Frequency       Co-evaluation PT/OT/SLP Co-Evaluation/Treatment: Yes Reason for Co-Treatment: To address functional/ADL transfers PT goals addressed during session: Mobility/safety with mobility;Balance;Strengthening/ROM OT goals addressed during session: ADL's and self-care       AM-PAC PT "6 Clicks" Mobility  Outcome Measure Help needed turning from your back to your side while in a flat bed without using bedrails?: None Help needed moving from lying on your back to sitting on the side of a flat bed without using bedrails?: None Help needed moving to and from a bed to a chair (including a wheelchair)?: None Help needed standing up from a chair  using your arms (e.g., wheelchair or bedside chair)?: None Help needed to walk in hospital room?: A Little Help needed climbing 3-5 steps with a railing? : A Little 6 Click Score: 22    End of Session Equipment Utilized During Treatment: Oxygen Activity Tolerance: Patient tolerated treatment well;No increased pain;Patient limited by fatigue Patient left: in chair Nurse Communication: Mobility status PT Visit Diagnosis: Unsteadiness on feet (R26.81);Other abnormalities of gait and mobility (R26.89);Muscle weakness (generalized) (M62.81)    Time: 7262-0355 PT Time Calculation (min) (ACUTE ONLY): 31 min   Charges:   PT Evaluation $PT Eval Moderate Complexity: 1 Mod PT Treatments $Therapeutic Activity: 23-37 mins        9:32 AM, 04/26/22 Lestine Box, S/PT

## 2022-04-26 NOTE — Evaluation (Signed)
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   COPD (chronic obstructive pulmonary disease) ([place]) 02/06/2018   Hypertension 02/06/2018   Abnormal EKG 02/06/2018   Hyperphosphatemia 02/06/2018   Skin lesions 02/06/2018   Hyperglycemia 02/06/2018   Chronic diastolic heart failure ([place]) 02/06/2018   Past Medical History:  Past Medical History:  Diagnosis Date   COPD (chronic obstructive pulmonary disease) ([place])    Essential hypertension    History of asbestosis 2010   History of cardiac catheterization 2012   Reportedly normal coronaries   Respiratory failure with hypoxia ([place])    Home oxygen   Past Surgical History:  Past Surgical History:  Procedure Laterality Date   CARDIAC CATHETERIZATION     x 2   CHOLECYSTECTOMY     2015   CYSTOSCOPY W/ RETROGRADES Bilateral 12/17/2017   Procedure: CYSTOSCOPY WITH RETROGRADE PYELOGRAM;  Surgeon: Ceasar Mons, MD;  Location: [facility];  Service: Urology;  Laterality: Bilateral;   HEMORRHOID SURGERY     HERNIA REPAIR  2008   Abdominal    TRANSURETHRAL RESECTION OF BLADDER TUMOR N/A 12/17/2017   Procedure: TRANSURETHRAL RESECTION OF BLADDER TUMOR (TURBT);  Surgeon: Ceasar Mons, MD;  Location: [facility];  Service: Urology;  Laterality: N/A;   TRANSURETHRAL RESECTION OF BLADDER TUMOR N/A 02/11/2018   Procedure: TRANSURETHRAL RESECTION OF BLADDER TUMOR (TURBT) WITH INSTILLATION OF MITOMYCIN;  Surgeon: Ceasar Mons, MD;  Location: [facility];  Service: Urology;  Laterality: N/A;  ONLY NEEDS 30 MIN   VASECTOMY   1998   HPI:  FREDRICO BEEDLE is a 76 y.o. 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  Patient was admitted with multifocal, acute CVA that was likely embolic in nature.  2D echocardiogram performed with LVEF 40-45% and noted hypokinesis and grade 1 diastolic dysfunction which appears to be stable from comparison in 2019.  No thrombus noted.  He was noted to have elevated LDL for which she will be started on statin and hemoglobin A1c was within normal limits.  Recommendations are as noted above and he was seen by PT with no further need for therapy or other resources at home.  He is in stable condition for discharge.   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 Expressive errors consist of semantic and phonemic paraphasias, neologisms, confabulation and Pt is aware of his errors. Pt may have one correct word during a sentence in a conversation setting. Pt named items in the room with about 50% accuracy and he repeated sentences with 50% accuracy as well. Pt's receptive impairment is mild and usually with additional processing time he does realize what is being asked of him or correctly follow command or answer the question. Further note resonsive naming and category naming was 100% accurate. Pt may demonstrate some apraxia as some groping behaviors + inconsistent errors were observed during the evaluation. Pt will benefit from an in depth  speech and language evaluation and Outpatient Speech therapy as soon as possible after D/C. ST will continue to follow acutely, thank you    SLP Assessment  SLP Recommendation/Assessment: Patient needs continued Speech Lanaguage Pathology Services SLP Visit Diagnosis: Apraxia (R48.2);Aphasia  (R47.01)    Recommendations for follow up therapy are one component of a multi-disciplinary discharge planning process, led by the attending physician.  Recommendations may be updated based on patient status, additional functional criteria and insurance authorization.    Follow Up Recommendations  Outpatient SLP    Assistance Recommended at Discharge     Functional Status Assessment    Frequency and Duration min 1 x/week  1 week      SLP Evaluation       Comprehension  Auditory Comprehension Overall Auditory Comprehension: Appears within functional limits for tasks assessed Yes/No Questions: Within Functional Limits Commands: Within Functional Limits Conversation: Simple Reading Comprehension Reading Status: Not tested    Expression Expression Primary Mode of Expression: Verbal Verbal Expression Overall Verbal Expression: Impaired Initiation: Impaired Automatic Speech: Name;Counting;Month of year;Day of week Level of Generative/Spontaneous Verbalization: Word Repetition: Impaired Level of Impairment: Sentence level Naming: Impairment Responsive: 76-100% accurate Confrontation: Impaired Convergent: 0-24% accurate Divergent: 0-24% accurate Verbal Errors: Neologisms;Confabulation;Semantic paraphasias;Phonemic paraphasias;Aware of errors Pragmatics: No impairment Written Expression Dominant Hand: Right   Oral / Motor  Oral Motor/Sensory Function Overall Oral Motor/Sensory Function: Within functional limits Motor Speech Motor Planning: Impaired Level of Impairment: Word Motor Speech Errors: Aware;Groping for words;Inconsistent             Jamia Hoban H. Roddie Mc, CCC-SLP Speech Language Pathologist   Wende Bushy 04/26/2022, 4:50 PM

## 2022-04-26 NOTE — Care Management CC44 (Signed)
Condition Code 44 Documentation Completed  Patient Details  Name: Timothy [name] MRN: [id number] Date of Birth: [birth year]   Condition Code 44 given:  Yes Patient signature on Condition Code 44 notice:  Yes Documentation of 2 MD's agreement:  Yes Code 44 added to claim:  Yes    Boneta Lucks, RN 04/26/2022, 10:36 AM

## 2022-04-26 NOTE — Evaluation (Signed)
Occupational Therapy Evaluation Patient Details Name: Timothy [name] MRN: [id number] DOB: [birth year] Today's Date: 04/26/2022   History of Present Illness Timothy [name] is a 76 y.o. 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  He was unable to communicate like he normally does and therefore EMS was called.  He was last known well at 10 PM last night when he was talking normally with his family.  Further history could not be obtained as he cannot communicate.  Son and daughter at bedside denies any current alcohol use or tobacco abuse and state that he is otherwise compliant with home medications.  He otherwise ambulates normally with no assistance. (Per DO)   Clinical Impression   Pt agreeable to OT and PT co-evaluation. Pt presents with expressive difficulties. Pt able to verbalize yes and no to answer prior history questions. Pt able to verbalize "son" when asked who he lived with.  Pt demonstrates mild weakness in R UE elbow extension with most notable issue being L UE gross motor coordination. Pt was able to use L UE functionally but was noted to be unable to accurately touch nose if eyes closed. Pt is independent for transfers and ADL's at this time. Recommend pt follow up with doctor on gross motor coordination deficits with possible outpatient OT if needed. Pt is not recommended for further acute OT services and will be discharged to care of nursing staff for remaining length of stay.      Recommendations for follow up therapy are one component of a multi-disciplinary discharge planning process, led by the attending physician.  Recommendations may be updated based on patient status, additional functional criteria and insurance authorization.   Follow Up Recommendations  Outpatient OT    Assistance Recommended at  Discharge PRN  Patient can return home with the following Assist for transportation    Functional Status Assessment  Patient has not had a recent decline in their functional status.  Equipment Recommendations  None recommended by OT    Recommendations for Other Services Other (comment) (Follow up with doctor about L UE gross motor deficits. Possibly see outpatient OT if desired.)     Precautions / Restrictions Precautions Precautions: Fall Restrictions Weight Bearing Restrictions: No      Mobility Bed Mobility Overal bed mobility: Independent                  Transfers Overall transfer level: Independent Equipment used:  (Supplemental O2 vial nasal cannula.)               General transfer comment: Idnpendent to chair and for ambulation in hall.      Balance Overall balance assessment: Independent                                         ADL either performed or assessed with clinical judgement   ADL Overall ADL's : Independent                                       General ADL Comments: Pt able to don sliding shoes without assist. Generally good mobility and functional UE use.  Vision Baseline Vision/History: 1 Wears glasses Ability to See in Adequate Light: 1 Impaired Patient Visual Report: No change from baseline Vision Assessment?: Yes Ocular Range of Motion: Within Functional Limits Alignment/Gaze Preference: Within Defined Limits Tracking/Visual Pursuits: Able to track stimulus in all quads without difficulty Convergence: Impaired (comment) (Pt not converging at all when prompted today.) Visual Fields: No apparent deficits                Pertinent Vitals/Pain Pain Assessment Pain Assessment: Faces Pain Score: 0-No pain     Hand Dominance Right   Extremity/Trunk Assessment Upper Extremity Assessment Upper Extremity Assessment: RUE deficits/detail;LUE deficits/detail (Mild weakness with only noted  difference with R UE elbow extension which was) RUE Deficits / Details: Mild weakness with only noted difference with R UE elbow extension which was at 4/5. Pt 4+/5 grossly other than this. RUE Sensation: WNL RUE Coordination: WNL LUE Deficits / Details: 4+/5 grossly LUE Sensation: WNL LUE Coordination: decreased gross motor (Pt was unable to touch nose with L UE with eyes closed. Pt touching just under eye consistently.)   Lower Extremity Assessment Lower Extremity Assessment: Defer to PT evaluation   Cervical / Trunk Assessment Cervical / Trunk Assessment: Normal   Communication Communication Communication: Expressive difficulties   Cognition Arousal/Alertness: Awake/alert Behavior During Therapy: WFL for tasks assessed/performed Overall Cognitive Status: Within Functional Limits for tasks assessed                                 General Comments: Pt able to answer yes and no questions today. Trouble with other verbal communication.                Home Living Family/patient expects to be discharged to:: Private residence Living Arrangements: Children Available Help at Discharge: Family;Available 24 hours/day Type of Home: Other(Comment) (RV) Home Access: Level entry (Difficult to fully understand if pt has level entry or a ramp. Pt reported not having steps.)     Home Layout: One level     Bathroom Shower/Tub: Other (comment) (RV shower with curtain that you can walk into.)   Bathroom Toilet: Standard Bathroom Accessibility: No   Home Equipment: None   Additional Comments: Pt on 2L O2 at baseline. Info per pt report answering yes and no questions.      Prior Functioning/Environment Prior Level of Function : Needs assist             Mobility Comments: Community ambulator without AD. ADLs Comments: Indepndent ADL's; able to cook and do some cleaning; reported that someone gets groceries for him. Pt does not drive.                                 Co-evaluation PT/OT/SLP Co-Evaluation/Treatment: Yes Reason for Co-Treatment: To address functional/ADL transfers   OT goals addressed during session: ADL's and self-care      AM-PAC OT "6 Clicks" Daily Activity     Outcome Measure Help from another person eating meals?: None Help from another person taking care of personal grooming?: None Help from another person toileting, which includes using toliet, bedpan, or urinal?: None Help from another person bathing (including washing, rinsing, drying)?: None Help from another person to put on and taking off regular upper body clothing?: None Help from another person to put on and taking off regular lower body clothing?: None 6 Click Score:  24   End of Session Equipment Utilized During Treatment: Oxygen (2L via nasal cannula)  Activity Tolerance: Patient tolerated treatment well Patient left: in chair;with call bell/phone within reach  OT Visit Diagnosis: Cognitive communication deficit (R41.841);Other symptoms and signs involving cognitive function Symptoms and signs involving cognitive functions: Cerebral infarction                Time: 4627-0350 OT Time Calculation (min): 28 min Charges:  OT General Charges $OT Visit: 1 Visit OT Evaluation $OT Eval Low Complexity: 1 Low  Yoshiye Kraft OT, MOT  Larey Seat 04/26/2022, 9:03 AM

## 2022-04-26 NOTE — Discharge Summary (Signed)
Physician Discharge Summary  Timothy [name] GGE:[id number] DOB: [birth year] DOA: 04/25/2022  PCP: Glenda Chroman, MD  Admit date: 04/25/2022  Discharge date: 04/26/2022  Admitted From:Home  Disposition:  Home  Recommendations for Outpatient Follow-up:  Follow up with PCP in 1-2 weeks Follow-up with neurology Dr. Merlene Laughter as recommended in 3 months Continue aspirin and Plavix for 90 days and then Plavix only thereafter Start atorvastatin 40 mg daily 30-day event monitor to be sent at home Continue other home medications as prior  Home Health: None  Equipment/Devices: Has home nasal cannula oxygen  Discharge Condition:Stable  CODE STATUS: Full  Diet recommendation: Heart Healthy  Brief/Interim Summary: Timothy [name] is a 76 y.o. 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  Patient was admitted with multifocal, acute CVA that was likely embolic in nature.  2D echocardiogram performed with LVEF 40-45% and noted hypokinesis and grade 1 diastolic dysfunction which appears to be stable from comparison in 2019.  No thrombus noted.  He was noted to have elevated LDL for which she will be started on statin and hemoglobin A1c was within normal limits.  Recommendations are as noted above and he was seen by PT with no further need for therapy or other resources at home.  He is in stable condition for discharge.  Discharge Diagnoses:  Principal Problem:   CVA (cerebral vascular accident) ([place]) Active Problems:   COPD (chronic obstructive pulmonary disease) ([place])   Hypertension   Chronic hypoxemic respiratory failure ([place])  Principal discharge diagnosis: Multifocal acute CVA with speech deficits.  Discharge Instructions  Discharge Instructions     Diet - low sodium heart healthy   Complete by: As  directed    Increase activity slowly   Complete by: As directed       Allergies as of 04/26/2022       Reactions   Codeine Itching        Medication List     TAKE these medications    albuterol 108 (90 Base) MCG/ACT inhaler Commonly known as: VENTOLIN HFA Inhale 2 puffs into the lungs every 6 (six) hours as needed for wheezing or shortness of breath.   alendronate 70 MG tablet Commonly known as: FOSAMAX Take 70 mg by mouth once a week.   aspirin 81 MG chewable tablet Chew 1 tablet (81 mg total) by mouth daily. Start taking on: April 27, 2022   atorvastatin 40 MG tablet Commonly known as: Lipitor Take 1 tablet (40 mg total) by mouth daily.   citalopram 20 MG tablet Commonly known as: CELEXA Take 20 mg by mouth at bedtime.   clopidogrel 75 MG tablet Commonly known as: PLAVIX Take 75 mg by mouth daily.   gabapentin 100 MG capsule Commonly known as: NEURONTIN Take 100 mg by mouth in the morning, at noon, and at bedtime.   ipratropium-albuterol 0.5-2.5 (3) MG/3ML Soln Commonly known as: DUONEB Take 3 mLs by nebulization every 6 (six) hours as needed.   lisinopril 10 MG tablet Commonly known as: ZESTRIL Take 10 mg by mouth daily.   OXYGEN 2 LPM   risperiDONE 0.5 MG tablet Commonly known as: RISPERDAL Take 0.5 mg by mouth 2 (two) times daily.   Trelegy Ellipta 200-62.5-25 MCG/ACT Aepb Generic drug: Fluticasone-Umeclidin-Vilant Inhale 1 puff into the lungs daily at 2 PM.  Follow-up Information     Vyas, Dhruv B, MD. Schedule an appointment as soon as possible for a visit in 1 week(s).   Specialty: Internal Medicine Contact information: [place] [address] [phone]         Phillips Odor, MD. Schedule an appointment as soon as possible for a visit in 3 month(s).   Specialty: Neurology Contact information: [address] [place] [address] [phone]                Allergies  Allergen Reactions   Codeine Itching     Consultations: Neurology   Procedures/Studies: ECHOCARDIOGRAM COMPLETE  Result Date: 04/25/2022    ECHOCARDIOGRAM REPORT   Patient Name:   Timothy [name] Date of Exam: 04/25/2022 Medical Rec #:  [id number]      Height:       68.0 in Accession #:    [id number]     Weight:       155.3 lb Date of Birth:  1946-03-13       BSA:          1.835 m Patient Age:    76 years       BP:           144/101 mmHg Patient Gender: M              HR:           75 bpm. Exam Location:  [facility] Procedure: 2D Echo, Cardiac Doppler and Color Doppler Indications:    Stroke I63.9  History:        Patient has prior history of Echocardiogram examinations, most                 recent 02/06/2018. COPD; Risk Factors:Former Smoker and                 Hypertension. History of Asbestosis.  Sonographer:    Alvino Chapel RCS Referring Phys: [id number] [place]  1. Left ventricular ejection fraction, by estimation, is 40 to 45%. Left ventricular ejection fraction by 2D MOD biplane is 43.6 %. The left ventricle has mildly decreased function. The left ventricle demonstrates global hypokinesis. Left ventricular diastolic parameters are consistent with Grade I diastolic dysfunction (impaired relaxation).  2. Right ventricular systolic function is normal. The right ventricular size is normal. Tricuspid regurgitation signal is inadequate for assessing PA pressure.  3. The mitral valve is grossly normal. No evidence of mitral valve regurgitation.  4. The aortic valve is tricuspid. Aortic valve regurgitation is not visualized.  5. The inferior vena cava is normal in size with greater than 50% respiratory variability, suggesting right atrial pressure of 3 mmHg. Comparison(s): No significant change from prior study. 02/06/2018: LVEF 40%. FINDINGS  Left Ventricle: Left ventricular ejection fraction, by estimation, is 40 to 45%. Left ventricular ejection fraction by 2D MOD biplane is 43.6 %. The left ventricle has mildly decreased  function. The left ventricle demonstrates global hypokinesis. The left ventricular internal cavity size was normal in size. There is no left ventricular hypertrophy. Left ventricular diastolic parameters are consistent with Grade I diastolic dysfunction (impaired relaxation). Indeterminate filling pressures. Right Ventricle: The right ventricular size is normal. No increase in right ventricular wall thickness. Right ventricular systolic function is normal. Tricuspid regurgitation signal is inadequate for assessing PA pressure. Left Atrium: Left atrial size was normal in size. Right Atrium: Right atrial size was normal in size. Pericardium: There is no evidence of pericardial effusion. Presence  of epicardial fat layer. Mitral Valve: The mitral valve is grossly normal. No evidence of mitral valve regurgitation. Tricuspid Valve: The tricuspid valve is normal in structure. Tricuspid valve regurgitation is not demonstrated. Aortic Valve: The aortic valve is tricuspid. Aortic valve regurgitation is not visualized. Pulmonic Valve: The pulmonic valve was normal in structure. Pulmonic valve regurgitation is not visualized. Aorta: The aortic root and ascending aorta are structurally normal, with no evidence of dilitation. Venous: The inferior vena cava is normal in size with greater than 50% respiratory variability, suggesting right atrial pressure of 3 mmHg. IAS/Shunts: No atrial level shunt detected by color flow Doppler.  LEFT VENTRICLE PLAX 2D                        Biplane EF (MOD) LVIDd:         4.50 cm         LV Biplane EF:   Left LVIDs:         3.60 cm                          ventricular LV PW:         1.00 cm                          ejection LV IVS:        0.90 cm                          fraction by LVOT diam:     2.00 cm                          2D MOD LV SV:         53                               biplane is LV SV Index:   29                               43.6 %. LVOT Area:     3.14 cm                                 Diastology                                LV e' medial:    6.64 cm/s LV Volumes (MOD)               LV E/e' medial:  7.8 LV vol d, MOD    113.0 ml      LV e' lateral:   7.72 cm/s A2C:                           LV E/e' lateral: 6.7 LV vol d, MOD    81.8 ml A4C: LV vol s, MOD    63.0 ml A2C: LV vol s, MOD    49.1 ml A4C: LV SV MOD A2C:   50.0 ml LV SV MOD A4C:   81.8 ml LV SV MOD BP:  46.0 ml RIGHT VENTRICLE RV S prime:     15.45 cm/s TAPSE (M-mode): 2.0 cm LEFT ATRIUM             Index        RIGHT ATRIUM           Index LA diam:        3.50 cm 1.91 cm/m   RA Area:     13.50 cm LA Vol (A2C):   50.5 ml 27.51 ml/m  RA Volume:   33.30 ml  18.14 ml/m LA Vol (A4C):   43.1 ml 23.48 ml/m LA Biplane Vol: 47.5 ml 25.88 ml/m  AORTIC VALVE LVOT Vmax:   88.80 cm/s LVOT Vmean:  52.700 cm/s LVOT VTI:    0.170 m  AORTA Ao Root diam: 3.60 cm MITRAL VALVE MV Area (PHT): 1.87 cm    SHUNTS MV Decel Time: 405 msec    Systemic VTI:  0.17 m MV E velocity: 51.80 cm/s  Systemic Diam: 2.00 cm MV A velocity: 79.20 cm/s MV E/A ratio:  0.65 Lyman Bishop MD Electronically signed by Lyman Bishop MD Signature Date/Time: 04/25/2022/5:20:39 PM    Final    CT ANGIO HEAD NECK W WO CM W PERF (CODE STROKE)  Addendum Date: 04/25/2022   ADDENDUM REPORT: 04/25/2022 11:31 ADDENDUM: Included in the distal vessel atherosclerotic irregularity, there are areas narrowing in the proximal M2 branches on both sides, left more than right. Electronically Signed   By: Nelson Chimes M.D.   On: 04/25/2022 11:31   Result Date: 04/25/2022 CLINICAL DATA:  Neuro deficit, acute, stroke suspected. EXAM: CT ANGIOGRAPHY HEAD AND NECK CT PERFUSION BRAIN TECHNIQUE: Multidetector CT imaging of the head and neck was performed using the standard protocol during bolus administration of intravenous contrast. Multiplanar CT image reconstructions and MIPs were obtained to evaluate the vascular anatomy. Carotid stenosis measurements (when applicable) are obtained  utilizing NASCET criteria, using the distal internal carotid diameter as the denominator. Multiphase CT imaging of the brain was performed following IV bolus contrast injection. Subsequent parametric perfusion maps were calculated using RAPID software. RADIATION DOSE REDUCTION: This exam was performed according to the departmental dose-optimization program which includes automated exposure control, adjustment of the mA and/or kV according to patient size and/or use of iterative reconstruction technique. CONTRAST:  164m OMNIPAQUE IOHEXOL 350 MG/ML SOLN COMPARISON:  Head CT earlier same day.  MRI 12/16/2021. FINDINGS: CTA NECK FINDINGS Aortic arch: Aortic atherosclerosis. Branching pattern is normal. Mild stenosis of the left common carotid artery and left subclavian artery origins, not flow limiting. Right carotid system: Common carotid artery widely patent to the bifurcation. Soft and calcified plaque at the carotid bifurcation and ICA bulb. No measurable stenosis of the ICA bulb. Left carotid system: Common carotid artery widely patent to the bifurcation. Soft and calcified plaque at the bifurcation and ICA bulb with irregularity. Minimal diameter of the proximal ICA measures 3 mm. Compared to a more distal cervical ICA diameter of 4 mm, there is a 25% stenosis. Vertebral arteries: Both vertebral artery origins show mild plaque but no stenosis greater than 20%. Beyond the origins the vessels are normal through the cervical region. Skeleton: Ordinary cervical spondylosis. Chronic facet fusion at C2-3. Other neck: No mass or lymphadenopathy. Upper chest: Severe emphysema. Pulmonary scarring. No active process. Review of the MIP images confirms the above findings CTA HEAD FINDINGS Anterior circulation: Both internal carotid arteries are patent through the skull base and siphon regions. There is siphon atherosclerosis but without stenosis  greater than 30% on either side. The anterior and middle cerebral vessels are  patent. No large vessel occlusion. No identifiable distal branch vessel occlusion. There is some distal vessel atherosclerotic irregularity. Posterior circulation: Both vertebral arteries are patent through the foramen magnum to the basilar. No basilar stenosis. Posterior circulation branch vessels are patent. Distal PCA branch vessels show atherosclerotic irregularity. Venous sinuses: Patent and normal. Anatomic variants: None significant. Review of the MIP images confirms the above findings CT Brain Perfusion Findings: ASPECTS: 10 CBF (<30%) Volume: 4 mL Perfusion (Tmax>6.0s) volume: 5ML Mismatch Volume: 18m Infarction Location:Left frontal opercular cortex and subcortical white matter. IMPRESSION: Completed 4-5 cc infarction in the left frontal opercular cortex and underlying white matter. No intracranial large vessel occlusion or correctable proximal stenosis. Widespread distal vessel atherosclerotic irregularity. I cannot specifically identify the vessel responsible for the left frontal opercular infarction. Aortic atherosclerosis.  Emphysema. Atherosclerotic disease at both carotid bifurcations. Soft and calcified plaque. Some irregularity of the plaque bilaterally but worse on the left could certainly serve as a source of embolic disease. No measurable stenosis on the right. 25% stenosis of the proximal ICA on the left. Electronically Signed: By: MNelson ChimesM.D. On: 04/25/2022 10:41   MR BRAIN WO CONTRAST  Result Date: 04/25/2022 CLINICAL DATA:  76year old male code stroke presentation today. Abnormal CT Perfusion in the left hemisphere with no large vessel occlusion on CTA. EXAM: MRI HEAD WITHOUT CONTRAST TECHNIQUE: Multiplanar, multiecho pulse sequences of the brain and surrounding structures were obtained without intravenous contrast. COMPARISON:  CT head, CTA and CTP earlier today. Brain MRI 12/17/2021. FINDINGS: Brain: Cortical and subcortical restricted diffusion in the left middle frontal gyrus  corresponding to the CTP abnormality (series 5, image 27). Nearby tiny focus of postcentral cortical restricted diffusion series 5, image 29. Additionally, there is a small area of abnormal cortical diffusion in the contralateral right parietal lobe on series 5, image 24 which is in an area of encephalomalacia and not definitely restricted on ADC. And furthermore there is a punctate area of suspected restricted diffusion in the right lentiform nucleus series 5, image 18. Superimposed scattered cortical encephalomalacia in the bilateral posterior MCA and PCA territories. Mild associated hemosiderin. Patchy widely scattered bilateral cerebral white matter T2 and FLAIR hyperintensity, with occasional small cystic areas of white matter encephalomalacia (right anterior centrum semiovale series 12, image 25). Small chronic bilateral caudate lacunar infarcts. Thalami, brainstem, and cerebellum appear relatively spared - although there is a small chronic left SCA cerebellar infarct (series 10, image 6). No midline shift, mass effect, evidence of mass lesion, ventriculomegaly, extra-axial collection or acute intracranial hemorrhage. Cervicomedullary junction and pituitary are within normal limits. Vascular: Major intracranial vascular flow voids are stable since February. Skull and upper cervical spine: Negative visible cervical spine. Visualized bone marrow signal is within normal limits. Sinuses/Orbits: Stable, negative. Other: Grossly normal visible internal auditory structures. Negative visible scalp and face. IMPRESSION: 1. Acute infarct left middle frontal gyrus with no associated hemorrhage or mass effect. Punctate nearby cortical infarct in the left postcentral gyrus. 2. Punctate lacunar infarct also suspected in the right caudate nucleus. And small area of subacute ischemia in the right parietal lobe. 3. Underlying asymmetric bilateral PCA and posterior MCA territory encephalomalacia with additional chronic small  vessel disease. Electronically Signed   By: HGenevie AnnM.D.   On: 04/25/2022 11:10   CT HEAD CODE STROKE WO CONTRAST  Result Date: 04/25/2022 CLINICAL DATA:  Code stroke.  Neuro deficit, acute, stroke suspected  EXAM: CT HEAD WITHOUT CONTRAST TECHNIQUE: Contiguous axial images were obtained from the base of the skull through the vertex without intravenous contrast. RADIATION DOSE REDUCTION: This exam was performed according to the departmental dose-optimization program which includes automated exposure control, adjustment of the mA and/or kV according to patient size and/or use of iterative reconstruction technique. COMPARISON:  CT head February 15, 2022. FINDINGS: Brain: Similar remote infarcts in the right parietal and left occipital lobes. Similar appearance of a probable small remote infarct in the superior left cerebellum. Additional patchy white matter hypoattenuation, nonspecific but compatible with chronic microvascular ischemic disease. No evidence of acute large vascular territory infarct or acute hemorrhage. No hydrocephalus, mass lesion, midline shift, or visible extra-axial fluid collection. Vascular: No hyperdense vessel identified. Calcific intracranial atherosclerosis. Skull: No acute fracture. Sinuses/Orbits: Clear sinuses.  No acute orbital findings. Other: No mastoid effusions. ASPECTS [facility] Stroke Program Early CT Score) total score (0-10 with 10 being normal): 10. IMPRESSION: 1. No evidence of acute intracranial abnormality. ASPECTS is 10. 2. Remote infarcts and chronic microvascular ischemic disease. Code stroke imaging results were communicated on 04/25/2022 at 10:02 am to provider Zammit via telephone, who verbally acknowledged these results. Electronically Signed   By: Margaretha Sheffield M.D.   On: 04/25/2022 10:04     Discharge Exam: Vitals:   04/26/22 0102 04/26/22 0603  BP: 136/72 139/78  Pulse: 75 74  Resp: 19 19  Temp: 98.1 F (36.7 C) 98 F (36.7 C)  SpO2: 96% 97%   Vitals:    04/25/22 1931 04/25/22 2103 04/26/22 0102 04/26/22 0603  BP: 130/70 131/75 136/72 139/78  Pulse: 71 71 75 74  Resp: '18 18 19 19  '$ Temp: 97.9 F (36.6 C) 98.4 F (36.9 C) 98.1 F (36.7 C) 98 F (36.7 C)  TempSrc:  Oral Oral Oral  SpO2: 100% 97% 96% 97%  Weight:      Height:        General: Pt is alert, awake, not in acute distress Cardiovascular: RRR, S1/S2 +, no rubs, no gallops Respiratory: CTA bilaterally, no wheezing, no rhonchi Abdominal: Soft, NT, ND, bowel sounds + Extremities: no edema, no cyanosis    The results of significant diagnostics from this hospitalization (including imaging, microbiology, ancillary and laboratory) are listed below for reference.     Microbiology: Recent Results (from the past 240 hour(s))  Resp Panel by RT-PCR (Flu A&B, Covid) Anterior Nasal Swab     Status: None   Collection Time: 04/25/22  9:52 AM   Specimen: Anterior Nasal Swab  Result Value Ref Range Status   SARS Coronavirus 2 by RT PCR NEGATIVE NEGATIVE Final    Comment: (NOTE) SARS-CoV-2 target nucleic acids are NOT DETECTED.  The SARS-CoV-2 RNA is generally detectable in upper respiratory specimens during the acute phase of infection. The lowest concentration of SARS-CoV-2 viral copies this assay can detect is 138 copies/mL. A negative result does not preclude SARS-Cov-2 infection and should not be used as the sole basis for treatment or other patient management decisions. A negative result may occur with  improper specimen collection/handling, submission of specimen other than nasopharyngeal swab, presence of viral mutation(s) within the areas targeted by this assay, and inadequate number of viral copies(<138 copies/mL). A negative result must be combined with clinical observations, patient history, and epidemiological information. The expected result is Negative.  Fact Sheet for Patients:  EntrepreneurPulse.com.au  Fact Sheet for Healthcare Providers:   IncredibleEmployment.be  This test is no t yet approved or cleared by  the [employer] and  has been authorized for detection and/or diagnosis of SARS-CoV-2 by FDA under an Emergency Use Authorization (EUA). This EUA will remain  in effect (meaning this test can be used) for the duration of the COVID-19 declaration under Section 564(b)(1) of the Act, 21 U.S.C.section 360bbb-3(b)(1), unless the authorization is terminated  or revoked sooner.       Influenza A by PCR NEGATIVE NEGATIVE Final   Influenza B by PCR NEGATIVE NEGATIVE Final    Comment: (NOTE) The Xpert Xpress SARS-CoV-2/FLU/RSV plus assay is intended as an aid in the diagnosis of influenza from Nasopharyngeal swab specimens and should not be used as a sole basis for treatment. Nasal washings and aspirates are unacceptable for Xpert Xpress SARS-CoV-2/FLU/RSV testing.  Fact Sheet for Patients: EntrepreneurPulse.com.au  Fact Sheet for Healthcare Providers: IncredibleEmployment.be  This test is not yet approved or cleared by the [place] FDA and has been authorized for detection and/or diagnosis of SARS-CoV-2 by FDA under an Emergency Use Authorization (EUA). This EUA will remain in effect (meaning this test can be used) for the duration of the COVID-19 declaration under Section 564(b)(1) of the Act, 21 U.S.C. section 360bbb-3(b)(1), unless the authorization is terminated or revoked.  Performed at [facility], [address], [place], [place] [address]      Labs: BNP (last 3 results) No results for input(s): "BNP" in the last 8760 hours. Basic Metabolic Panel: Recent Labs  Lab 04/25/22 0954 04/25/22 0956 04/26/22 0601  NA 140 142 140  K 4.1 4.1 4.1  CL 111 107 111  CO2 24  --  25  GLUCOSE 106* 102* 98  BUN '14 13 11  '$ CREATININE 1.20 1.30* 0.93  CALCIUM 8.6*  --  8.5*   Liver Function Tests: Recent Labs  Lab 04/25/22 0954 04/26/22 0601   AST 14* 13*  ALT 11 9  ALKPHOS 55 47  BILITOT 0.7 0.6  PROT 6.4* 5.9*  ALBUMIN 3.4* 3.0*   No results for input(s): "LIPASE", "AMYLASE" in the last 168 hours. No results for input(s): "AMMONIA" in the last 168 hours. CBC: Recent Labs  Lab 04/25/22 0954 04/25/22 0956 04/26/22 0601  WBC 7.2  --  5.7  NEUTROABS 4.4  --   --   HGB 13.4 14.3 12.7*  HCT 41.5 42.0 39.7  MCV 95.4  --  95.7  PLT 169  --  160   Cardiac Enzymes: No results for input(s): "CKTOTAL", "CKMB", "CKMBINDEX", "TROPONINI" in the last 168 hours. BNP: Invalid input(s): "POCBNP" CBG: Recent Labs  Lab 04/25/22 0952  GLUCAP 96   D-Dimer No results for input(s): "DDIMER" in the last 72 hours. Hgb A1c Recent Labs    04/25/22 0954  HGBA1C 5.4   Lipid Profile Recent Labs    04/26/22 0601  CHOL 184  HDL 41  LDLCALC 131*  TRIG 59  CHOLHDL 4.5   Thyroid function studies No results for input(s): "TSH", "T4TOTAL", "T3FREE", "THYROIDAB" in the last 72 hours.  Invalid input(s): "FREET3" Anemia work up No results for input(s): "VITAMINB12", "FOLATE", "FERRITIN", "TIBC", "IRON", "RETICCTPCT" in the last 72 hours. Urinalysis    Component Value Date/Time   COLORURINE YELLOW 04/25/2022 0952   APPEARANCEUR CLEAR 04/25/2022 0952   LABSPEC 1.031 (H) 04/25/2022 0952   PHURINE 6.0 04/25/2022 0952   GLUCOSEU NEGATIVE 04/25/2022 0952   HGBUR NEGATIVE 04/25/2022 0952   BILIRUBINUR NEGATIVE 04/25/2022 0952   KETONESUR NEGATIVE 04/25/2022 0952   PROTEINUR NEGATIVE 04/25/2022 0952   NITRITE NEGATIVE  04/25/2022 [place] 04/25/2022 0952   Sepsis Labs Recent Labs  Lab 04/25/22 0954 04/26/22 0601  WBC 7.2 5.7   Microbiology Recent Results (from the past 240 hour(s))  Resp Panel by RT-PCR (Flu A&B, Covid) Anterior Nasal Swab     Status: None   Collection Time: 04/25/22  9:52 AM   Specimen: Anterior Nasal Swab  Result Value Ref Range Status   SARS Coronavirus 2 by RT PCR NEGATIVE  NEGATIVE Final    Comment: (NOTE) SARS-CoV-2 target nucleic acids are NOT DETECTED.  The SARS-CoV-2 RNA is generally detectable in upper respiratory specimens during the acute phase of infection. The lowest concentration of SARS-CoV-2 viral copies this assay can detect is 138 copies/mL. A negative result does not preclude SARS-Cov-2 infection and should not be used as the sole basis for treatment or other patient management decisions. A negative result may occur with  improper specimen collection/handling, submission of specimen other than nasopharyngeal swab, presence of viral mutation(s) within the areas targeted by this assay, and inadequate number of viral copies(<138 copies/mL). A negative result must be combined with clinical observations, patient history, and epidemiological information. The expected result is Negative.  Fact Sheet for Patients:  EntrepreneurPulse.com.au  Fact Sheet for Healthcare Providers:  IncredibleEmployment.be  This test is no t yet approved or cleared by the [place] FDA and  has been authorized for detection and/or diagnosis of SARS-CoV-2 by FDA under an Emergency Use Authorization (EUA). This EUA will remain  in effect (meaning this test can be used) for the duration of the COVID-19 declaration under Section 564(b)(1) of the Act, 21 U.S.C.section 360bbb-3(b)(1), unless the authorization is terminated  or revoked sooner.       Influenza A by PCR NEGATIVE NEGATIVE Final   Influenza B by PCR NEGATIVE NEGATIVE Final    Comment: (NOTE) The Xpert Xpress SARS-CoV-2/FLU/RSV plus assay is intended as an aid in the diagnosis of influenza from Nasopharyngeal swab specimens and should not be used as a sole basis for treatment. Nasal washings and aspirates are unacceptable for Xpert Xpress SARS-CoV-2/FLU/RSV testing.  Fact Sheet for Patients: EntrepreneurPulse.com.au  Fact Sheet for Healthcare  Providers: IncredibleEmployment.be  This test is not yet approved or cleared by the [place] FDA and has been authorized for detection and/or diagnosis of SARS-CoV-2 by FDA under an Emergency Use Authorization (EUA). This EUA will remain in effect (meaning this test can be used) for the duration of the COVID-19 declaration under Section 564(b)(1) of the Act, 21 U.S.C. section 360bbb-3(b)(1), unless the authorization is terminated or revoked.  Performed at [facility], [address], [place], [place] [address]      Time coordinating discharge: 35 minutes  SIGNED:   Rodena Goldmann, DO [facility] 04/26/2022, 10:00 AM  If 7PM-7AM, please contact night-coverage [email or web address]

## 2022-04-26 NOTE — Telephone Encounter (Signed)
Per Dr. Manuella Ghazi Pt needs 30-day event monitor for CVA. Order placed and patient enrolled in [employer].

## 2022-04-26 NOTE — Care Management Obs Status (Signed)
[place] NOTIFICATION   Patient Details  Name: Timothy [name] MRN: [id number] Date of Birth: 1946/04/18   Medicare Observation Status Notification Given:  Yes    Boneta Lucks, RN 04/26/2022, 10:36 AM

## 2022-04-27 LAB — HEMOGLOBIN A1C
Hgb A1c MFr Bld: 5.2 % (ref 4.8–5.6)
Mean Plasma Glucose: 103 mg/dL

## 2022-05-02 DIAGNOSIS — J61 Pneumoconiosis due to asbestos and other mineral fibers: Secondary | ICD-10-CM | POA: Diagnosis not present

## 2022-05-02 DIAGNOSIS — J449 Chronic obstructive pulmonary disease, unspecified: Secondary | ICD-10-CM | POA: Diagnosis not present

## 2022-05-09 DIAGNOSIS — I10 Essential (primary) hypertension: Secondary | ICD-10-CM | POA: Diagnosis not present

## 2022-05-09 DIAGNOSIS — J9611 Chronic respiratory failure with hypoxia: Secondary | ICD-10-CM | POA: Diagnosis not present

## 2022-05-09 DIAGNOSIS — Z09 Encounter for follow-up examination after completed treatment for conditions other than malignant neoplasm: Secondary | ICD-10-CM | POA: Diagnosis not present

## 2022-05-09 DIAGNOSIS — J449 Chronic obstructive pulmonary disease, unspecified: Secondary | ICD-10-CM | POA: Diagnosis not present

## 2022-05-09 DIAGNOSIS — Z299 Encounter for prophylactic measures, unspecified: Secondary | ICD-10-CM | POA: Diagnosis not present

## 2022-06-02 DIAGNOSIS — J449 Chronic obstructive pulmonary disease, unspecified: Secondary | ICD-10-CM | POA: Diagnosis not present

## 2022-06-02 DIAGNOSIS — J61 Pneumoconiosis due to asbestos and other mineral fibers: Secondary | ICD-10-CM | POA: Diagnosis not present

## 2022-07-03 DIAGNOSIS — J61 Pneumoconiosis due to asbestos and other mineral fibers: Secondary | ICD-10-CM | POA: Diagnosis not present

## 2022-07-03 DIAGNOSIS — J449 Chronic obstructive pulmonary disease, unspecified: Secondary | ICD-10-CM | POA: Diagnosis not present

## 2022-07-24 DIAGNOSIS — Z6824 Body mass index (BMI) 24.0-24.9, adult: Secondary | ICD-10-CM | POA: Diagnosis not present

## 2022-07-24 DIAGNOSIS — Z79899 Other long term (current) drug therapy: Secondary | ICD-10-CM | POA: Diagnosis not present

## 2022-07-24 DIAGNOSIS — Z299 Encounter for prophylactic measures, unspecified: Secondary | ICD-10-CM | POA: Diagnosis not present

## 2022-07-24 DIAGNOSIS — R5383 Other fatigue: Secondary | ICD-10-CM | POA: Diagnosis not present

## 2022-07-24 DIAGNOSIS — Z0000 Encounter for general adult medical examination without abnormal findings: Secondary | ICD-10-CM | POA: Diagnosis not present

## 2022-07-24 DIAGNOSIS — Z23 Encounter for immunization: Secondary | ICD-10-CM | POA: Diagnosis not present

## 2022-07-24 DIAGNOSIS — I10 Essential (primary) hypertension: Secondary | ICD-10-CM | POA: Diagnosis not present

## 2022-07-24 DIAGNOSIS — E7800 Pure hypercholesterolemia, unspecified: Secondary | ICD-10-CM | POA: Diagnosis not present

## 2022-08-02 DIAGNOSIS — J61 Pneumoconiosis due to asbestos and other mineral fibers: Secondary | ICD-10-CM | POA: Diagnosis not present

## 2022-08-02 DIAGNOSIS — J449 Chronic obstructive pulmonary disease, unspecified: Secondary | ICD-10-CM | POA: Diagnosis not present

## 2022-08-11 DIAGNOSIS — J449 Chronic obstructive pulmonary disease, unspecified: Secondary | ICD-10-CM | POA: Diagnosis not present

## 2022-08-11 DIAGNOSIS — R0602 Shortness of breath: Secondary | ICD-10-CM | POA: Diagnosis not present

## 2022-08-11 DIAGNOSIS — Z66 Do not resuscitate: Secondary | ICD-10-CM | POA: Diagnosis not present

## 2022-08-11 DIAGNOSIS — J9602 Acute respiratory failure with hypercapnia: Secondary | ICD-10-CM | POA: Diagnosis not present

## 2022-08-11 DIAGNOSIS — J9601 Acute respiratory failure with hypoxia: Secondary | ICD-10-CM | POA: Diagnosis not present

## 2022-08-11 DIAGNOSIS — Z7902 Long term (current) use of antithrombotics/antiplatelets: Secondary | ICD-10-CM | POA: Diagnosis not present

## 2022-08-11 DIAGNOSIS — E872 Acidosis, unspecified: Secondary | ICD-10-CM | POA: Diagnosis not present

## 2022-08-11 DIAGNOSIS — E785 Hyperlipidemia, unspecified: Secondary | ICD-10-CM | POA: Diagnosis not present

## 2022-08-11 DIAGNOSIS — I493 Ventricular premature depolarization: Secondary | ICD-10-CM | POA: Diagnosis not present

## 2022-08-11 DIAGNOSIS — I10 Essential (primary) hypertension: Secondary | ICD-10-CM | POA: Diagnosis not present

## 2022-08-11 DIAGNOSIS — R778 Other specified abnormalities of plasma proteins: Secondary | ICD-10-CM | POA: Diagnosis not present

## 2022-08-11 DIAGNOSIS — R9431 Abnormal electrocardiogram [ECG] [EKG]: Secondary | ICD-10-CM | POA: Diagnosis not present

## 2022-08-11 DIAGNOSIS — I4891 Unspecified atrial fibrillation: Secondary | ICD-10-CM | POA: Diagnosis not present

## 2022-08-11 DIAGNOSIS — R7989 Other specified abnormal findings of blood chemistry: Secondary | ICD-10-CM | POA: Diagnosis not present

## 2022-08-11 DIAGNOSIS — Z87891 Personal history of nicotine dependence: Secondary | ICD-10-CM | POA: Diagnosis not present

## 2022-08-11 DIAGNOSIS — Z8673 Personal history of transient ischemic attack (TIA), and cerebral infarction without residual deficits: Secondary | ICD-10-CM | POA: Diagnosis not present

## 2022-08-11 DIAGNOSIS — Z885 Allergy status to narcotic agent status: Secondary | ICD-10-CM | POA: Diagnosis not present

## 2022-08-11 DIAGNOSIS — J439 Emphysema, unspecified: Secondary | ICD-10-CM | POA: Diagnosis not present

## 2022-08-12 DIAGNOSIS — R9431 Abnormal electrocardiogram [ECG] [EKG]: Secondary | ICD-10-CM | POA: Diagnosis not present

## 2022-08-12 DIAGNOSIS — J9601 Acute respiratory failure with hypoxia: Secondary | ICD-10-CM | POA: Diagnosis not present

## 2022-08-12 DIAGNOSIS — J9602 Acute respiratory failure with hypercapnia: Secondary | ICD-10-CM | POA: Diagnosis not present

## 2022-08-12 DIAGNOSIS — R778 Other specified abnormalities of plasma proteins: Secondary | ICD-10-CM | POA: Diagnosis not present

## 2022-08-13 DIAGNOSIS — J9601 Acute respiratory failure with hypoxia: Secondary | ICD-10-CM | POA: Diagnosis not present

## 2022-08-13 DIAGNOSIS — Z87891 Personal history of nicotine dependence: Secondary | ICD-10-CM | POA: Diagnosis not present

## 2022-08-13 DIAGNOSIS — J449 Chronic obstructive pulmonary disease, unspecified: Secondary | ICD-10-CM | POA: Diagnosis not present

## 2022-08-13 DIAGNOSIS — I700 Atherosclerosis of aorta: Secondary | ICD-10-CM | POA: Diagnosis not present

## 2022-08-13 DIAGNOSIS — Z66 Do not resuscitate: Secondary | ICD-10-CM | POA: Diagnosis not present

## 2022-08-13 DIAGNOSIS — F32A Depression, unspecified: Secondary | ICD-10-CM | POA: Diagnosis not present

## 2022-08-13 DIAGNOSIS — I2489 Other forms of acute ischemic heart disease: Secondary | ICD-10-CM | POA: Diagnosis not present

## 2022-08-13 DIAGNOSIS — J948 Other specified pleural conditions: Secondary | ICD-10-CM | POA: Diagnosis not present

## 2022-08-13 DIAGNOSIS — I359 Nonrheumatic aortic valve disorder, unspecified: Secondary | ICD-10-CM | POA: Diagnosis not present

## 2022-08-13 DIAGNOSIS — I110 Hypertensive heart disease with heart failure: Secondary | ICD-10-CM | POA: Diagnosis not present

## 2022-08-13 DIAGNOSIS — Z885 Allergy status to narcotic agent status: Secondary | ICD-10-CM | POA: Diagnosis not present

## 2022-08-13 DIAGNOSIS — R7989 Other specified abnormal findings of blood chemistry: Secondary | ICD-10-CM | POA: Diagnosis not present

## 2022-08-13 DIAGNOSIS — J9622 Acute and chronic respiratory failure with hypercapnia: Secondary | ICD-10-CM | POA: Diagnosis not present

## 2022-08-13 DIAGNOSIS — J9602 Acute respiratory failure with hypercapnia: Secondary | ICD-10-CM | POA: Diagnosis not present

## 2022-08-13 DIAGNOSIS — J441 Chronic obstructive pulmonary disease with (acute) exacerbation: Secondary | ICD-10-CM | POA: Diagnosis not present

## 2022-08-13 DIAGNOSIS — Z8673 Personal history of transient ischemic attack (TIA), and cerebral infarction without residual deficits: Secondary | ICD-10-CM | POA: Diagnosis not present

## 2022-08-13 DIAGNOSIS — N179 Acute kidney failure, unspecified: Secondary | ICD-10-CM | POA: Diagnosis not present

## 2022-08-13 DIAGNOSIS — Z79899 Other long term (current) drug therapy: Secondary | ICD-10-CM | POA: Diagnosis not present

## 2022-08-13 DIAGNOSIS — J1569 Pneumonia due to other gram-negative bacteria: Secondary | ICD-10-CM | POA: Diagnosis not present

## 2022-08-13 DIAGNOSIS — I5043 Acute on chronic combined systolic (congestive) and diastolic (congestive) heart failure: Secondary | ICD-10-CM | POA: Diagnosis not present

## 2022-08-13 DIAGNOSIS — J440 Chronic obstructive pulmonary disease with acute lower respiratory infection: Secondary | ICD-10-CM | POA: Diagnosis not present

## 2022-08-13 DIAGNOSIS — E785 Hyperlipidemia, unspecified: Secondary | ICD-10-CM | POA: Diagnosis not present

## 2022-08-13 DIAGNOSIS — Z9049 Acquired absence of other specified parts of digestive tract: Secondary | ICD-10-CM | POA: Diagnosis not present

## 2022-08-13 DIAGNOSIS — J439 Emphysema, unspecified: Secondary | ICD-10-CM | POA: Diagnosis not present

## 2022-08-13 DIAGNOSIS — R918 Other nonspecific abnormal finding of lung field: Secondary | ICD-10-CM | POA: Diagnosis not present

## 2022-08-13 DIAGNOSIS — J9621 Acute and chronic respiratory failure with hypoxia: Secondary | ICD-10-CM | POA: Diagnosis not present

## 2022-08-13 DIAGNOSIS — I10 Essential (primary) hypertension: Secondary | ICD-10-CM | POA: Diagnosis not present

## 2022-08-13 DIAGNOSIS — Z9981 Dependence on supplemental oxygen: Secondary | ICD-10-CM | POA: Diagnosis not present

## 2022-08-13 DIAGNOSIS — I4891 Unspecified atrial fibrillation: Secondary | ICD-10-CM | POA: Diagnosis not present

## 2022-08-13 DIAGNOSIS — J61 Pneumoconiosis due to asbestos and other mineral fibers: Secondary | ICD-10-CM | POA: Diagnosis not present

## 2022-08-13 DIAGNOSIS — Z7982 Long term (current) use of aspirin: Secondary | ICD-10-CM | POA: Diagnosis not present

## 2022-08-14 DIAGNOSIS — J439 Emphysema, unspecified: Secondary | ICD-10-CM | POA: Diagnosis not present

## 2022-08-14 DIAGNOSIS — R7989 Other specified abnormal findings of blood chemistry: Secondary | ICD-10-CM | POA: Diagnosis not present

## 2022-08-14 DIAGNOSIS — J948 Other specified pleural conditions: Secondary | ICD-10-CM | POA: Diagnosis not present

## 2022-08-14 DIAGNOSIS — I700 Atherosclerosis of aorta: Secondary | ICD-10-CM | POA: Diagnosis not present

## 2022-08-14 DIAGNOSIS — J441 Chronic obstructive pulmonary disease with (acute) exacerbation: Secondary | ICD-10-CM | POA: Diagnosis not present

## 2022-08-14 DIAGNOSIS — I10 Essential (primary) hypertension: Secondary | ICD-10-CM | POA: Diagnosis not present

## 2022-08-14 DIAGNOSIS — J9622 Acute and chronic respiratory failure with hypercapnia: Secondary | ICD-10-CM | POA: Diagnosis not present

## 2022-08-14 DIAGNOSIS — J1569 Pneumonia due to other gram-negative bacteria: Secondary | ICD-10-CM | POA: Diagnosis not present

## 2022-08-14 DIAGNOSIS — N179 Acute kidney failure, unspecified: Secondary | ICD-10-CM | POA: Diagnosis not present

## 2022-08-14 DIAGNOSIS — R918 Other nonspecific abnormal finding of lung field: Secondary | ICD-10-CM | POA: Diagnosis not present

## 2022-08-14 DIAGNOSIS — J9621 Acute and chronic respiratory failure with hypoxia: Secondary | ICD-10-CM | POA: Diagnosis not present

## 2022-08-15 DIAGNOSIS — I10 Essential (primary) hypertension: Secondary | ICD-10-CM | POA: Diagnosis not present

## 2022-08-15 DIAGNOSIS — R7989 Other specified abnormal findings of blood chemistry: Secondary | ICD-10-CM | POA: Diagnosis not present

## 2022-08-15 DIAGNOSIS — J441 Chronic obstructive pulmonary disease with (acute) exacerbation: Secondary | ICD-10-CM | POA: Diagnosis not present

## 2022-08-15 DIAGNOSIS — J9621 Acute and chronic respiratory failure with hypoxia: Secondary | ICD-10-CM | POA: Diagnosis not present

## 2022-08-15 DIAGNOSIS — N179 Acute kidney failure, unspecified: Secondary | ICD-10-CM | POA: Diagnosis not present

## 2022-08-16 DIAGNOSIS — I10 Essential (primary) hypertension: Secondary | ICD-10-CM | POA: Diagnosis not present

## 2022-08-16 DIAGNOSIS — J9621 Acute and chronic respiratory failure with hypoxia: Secondary | ICD-10-CM | POA: Diagnosis not present

## 2022-08-16 DIAGNOSIS — J441 Chronic obstructive pulmonary disease with (acute) exacerbation: Secondary | ICD-10-CM | POA: Diagnosis not present

## 2022-08-16 DIAGNOSIS — J1569 Pneumonia due to other gram-negative bacteria: Secondary | ICD-10-CM | POA: Diagnosis not present

## 2022-08-16 DIAGNOSIS — J9622 Acute and chronic respiratory failure with hypercapnia: Secondary | ICD-10-CM | POA: Diagnosis not present

## 2022-08-16 DIAGNOSIS — N179 Acute kidney failure, unspecified: Secondary | ICD-10-CM | POA: Diagnosis not present

## 2022-08-16 DIAGNOSIS — R7989 Other specified abnormal findings of blood chemistry: Secondary | ICD-10-CM | POA: Diagnosis not present

## 2022-08-26 DIAGNOSIS — Z299 Encounter for prophylactic measures, unspecified: Secondary | ICD-10-CM | POA: Diagnosis not present

## 2022-08-26 DIAGNOSIS — J9611 Chronic respiratory failure with hypoxia: Secondary | ICD-10-CM | POA: Diagnosis not present

## 2022-08-26 DIAGNOSIS — J449 Chronic obstructive pulmonary disease, unspecified: Secondary | ICD-10-CM | POA: Diagnosis not present

## 2022-08-26 DIAGNOSIS — Z09 Encounter for follow-up examination after completed treatment for conditions other than malignant neoplasm: Secondary | ICD-10-CM | POA: Diagnosis not present

## 2022-08-26 DIAGNOSIS — N183 Chronic kidney disease, stage 3 unspecified: Secondary | ICD-10-CM | POA: Diagnosis not present

## 2022-08-26 DIAGNOSIS — I10 Essential (primary) hypertension: Secondary | ICD-10-CM | POA: Diagnosis not present

## 2022-09-02 DIAGNOSIS — J449 Chronic obstructive pulmonary disease, unspecified: Secondary | ICD-10-CM | POA: Diagnosis not present

## 2022-09-02 DIAGNOSIS — J61 Pneumoconiosis due to asbestos and other mineral fibers: Secondary | ICD-10-CM | POA: Diagnosis not present

## 2022-10-02 DIAGNOSIS — J449 Chronic obstructive pulmonary disease, unspecified: Secondary | ICD-10-CM | POA: Diagnosis not present

## 2022-10-02 DIAGNOSIS — J61 Pneumoconiosis due to asbestos and other mineral fibers: Secondary | ICD-10-CM | POA: Diagnosis not present

## 2022-11-02 DIAGNOSIS — J449 Chronic obstructive pulmonary disease, unspecified: Secondary | ICD-10-CM | POA: Diagnosis not present

## 2022-11-02 DIAGNOSIS — J61 Pneumoconiosis due to asbestos and other mineral fibers: Secondary | ICD-10-CM | POA: Diagnosis not present

## 2022-12-03 DIAGNOSIS — J61 Pneumoconiosis due to asbestos and other mineral fibers: Secondary | ICD-10-CM | POA: Diagnosis not present

## 2022-12-03 DIAGNOSIS — J449 Chronic obstructive pulmonary disease, unspecified: Secondary | ICD-10-CM | POA: Diagnosis not present

## 2023-01-01 DIAGNOSIS — J449 Chronic obstructive pulmonary disease, unspecified: Secondary | ICD-10-CM | POA: Diagnosis not present

## 2023-01-01 DIAGNOSIS — J61 Pneumoconiosis due to asbestos and other mineral fibers: Secondary | ICD-10-CM | POA: Diagnosis not present

## 2023-02-01 DIAGNOSIS — J449 Chronic obstructive pulmonary disease, unspecified: Secondary | ICD-10-CM | POA: Diagnosis not present

## 2023-02-01 DIAGNOSIS — J61 Pneumoconiosis due to asbestos and other mineral fibers: Secondary | ICD-10-CM | POA: Diagnosis not present

## 2023-02-25 DIAGNOSIS — J449 Chronic obstructive pulmonary disease, unspecified: Secondary | ICD-10-CM | POA: Diagnosis not present

## 2023-02-25 DIAGNOSIS — E7800 Pure hypercholesterolemia, unspecified: Secondary | ICD-10-CM | POA: Diagnosis not present

## 2023-03-03 DIAGNOSIS — J61 Pneumoconiosis due to asbestos and other mineral fibers: Secondary | ICD-10-CM | POA: Diagnosis not present

## 2023-03-03 DIAGNOSIS — J449 Chronic obstructive pulmonary disease, unspecified: Secondary | ICD-10-CM | POA: Diagnosis not present

## 2023-04-03 DIAGNOSIS — J61 Pneumoconiosis due to asbestos and other mineral fibers: Secondary | ICD-10-CM | POA: Diagnosis not present

## 2023-04-03 DIAGNOSIS — J449 Chronic obstructive pulmonary disease, unspecified: Secondary | ICD-10-CM | POA: Diagnosis not present

## 2023-05-03 DIAGNOSIS — J449 Chronic obstructive pulmonary disease, unspecified: Secondary | ICD-10-CM | POA: Diagnosis not present

## 2023-05-03 DIAGNOSIS — J61 Pneumoconiosis due to asbestos and other mineral fibers: Secondary | ICD-10-CM | POA: Diagnosis not present

## 2023-05-21 IMAGING — CT CT CHEST W/O CM
2 of 4 series · 15 of 36 positions shown, 18 images · non-contrast
Comparison: CT 05/24/2020

CLINICAL DATA: Lung nodule, less than 6 mm, low cancer risk. COPD.
Shortness of breath. Asbestos exposure.

EXAM:
CT CHEST WITHOUT CONTRAST
TECHNIQUE: Multidetector CT imaging of the chest was performed following the
standard protocol without IV contrast.

[Series 2: routine chest without · axial · non-contrast · 0.80mm/px · z∈[+1241,+1505]mm · 12 of 158 slices shown, 15 images]
[im 13/158  mediastinal]
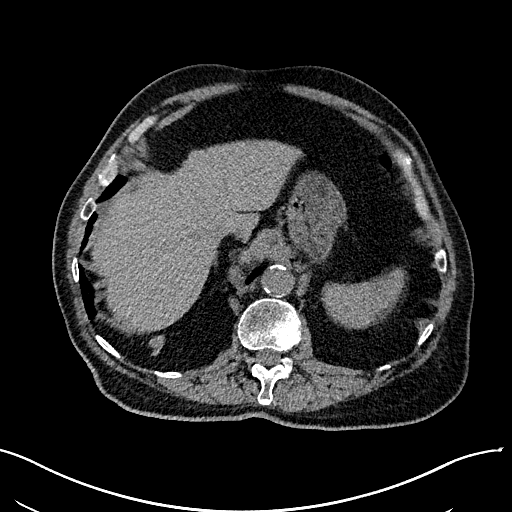
[im 13/158  lung]
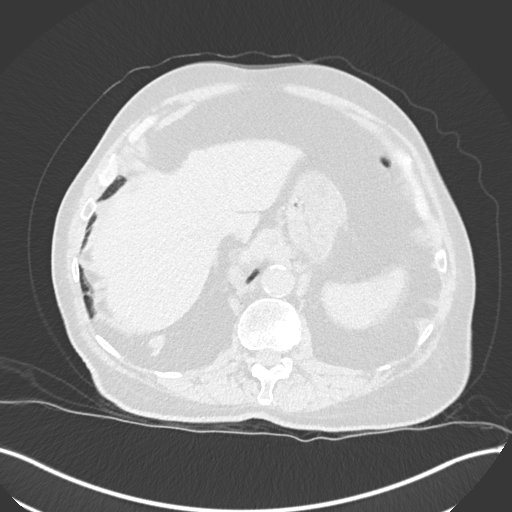
[im 25/158  lung]
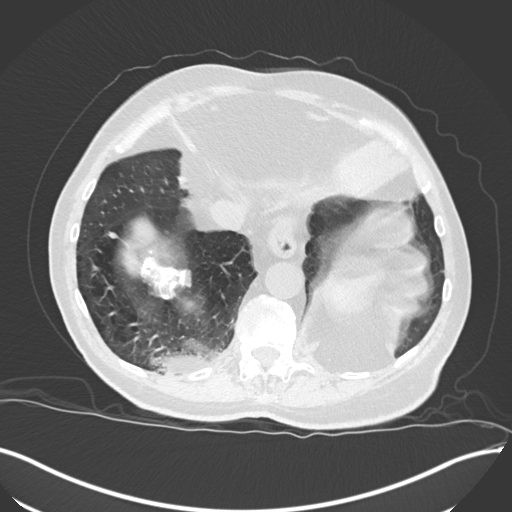
[im 37/158  lung]
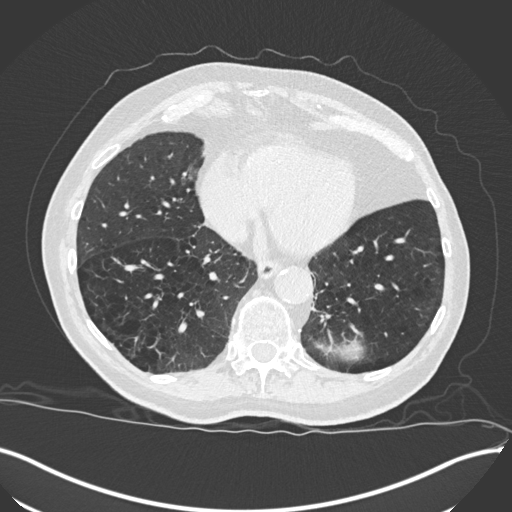
[im 49/158  lung]
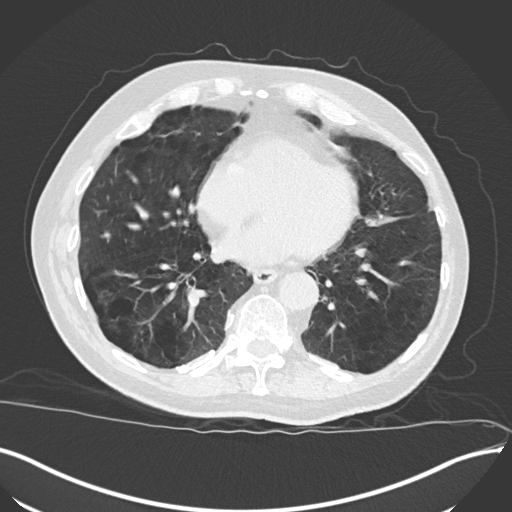
[im 61/158  mediastinal]
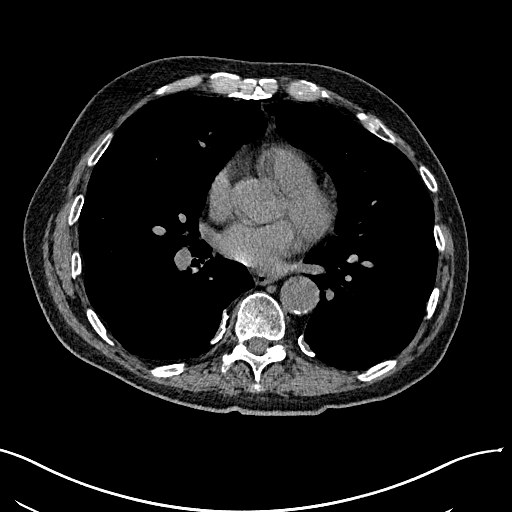
[im 61/158  lung]
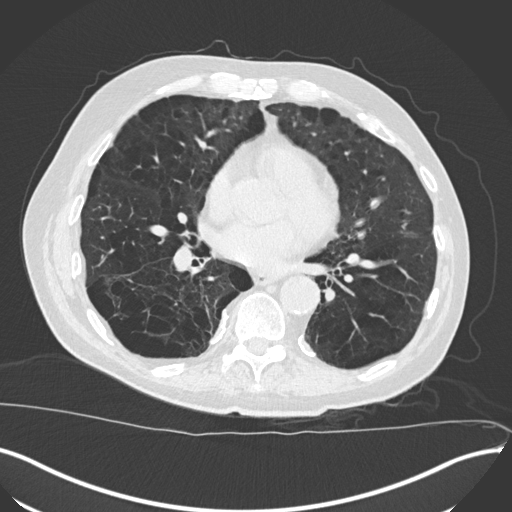
[im 73/158  lung]
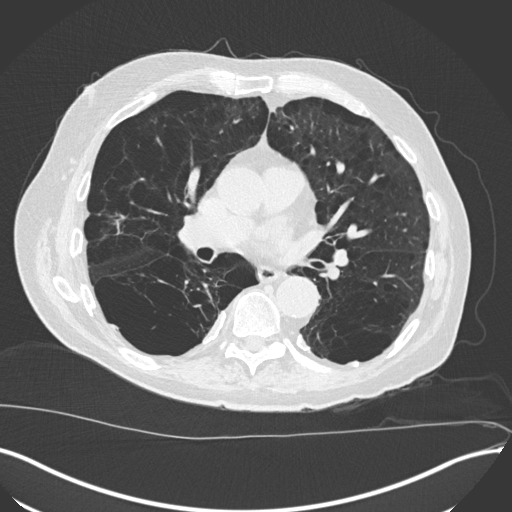
[im 85/158  lung]
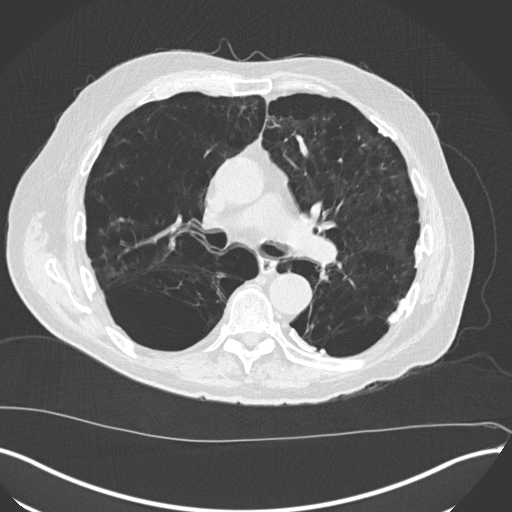
[im 97/158  lung]
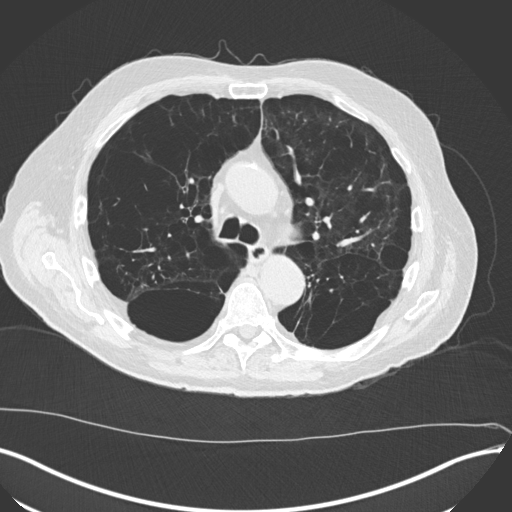
[im 109/158  mediastinal]
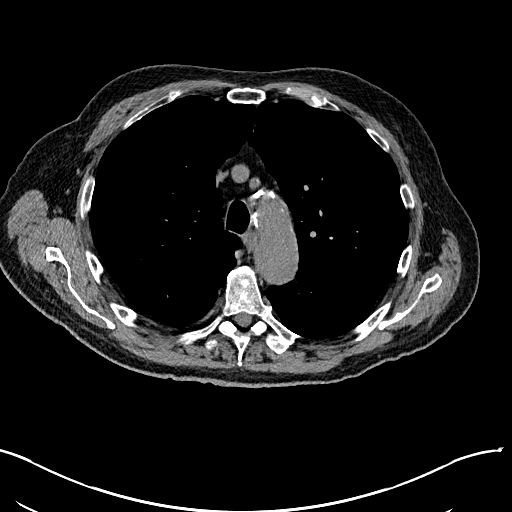
[im 109/158  lung]
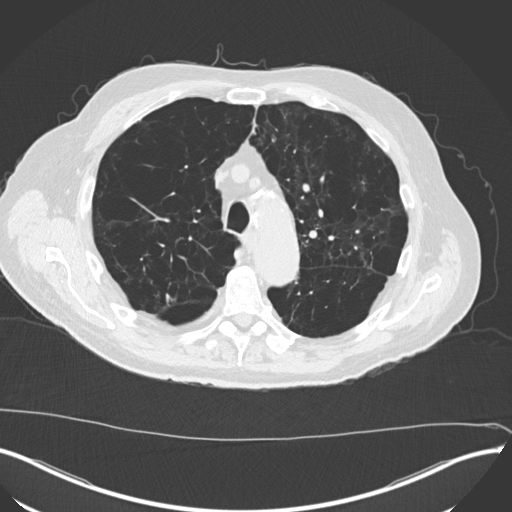
[im 121/158  lung]
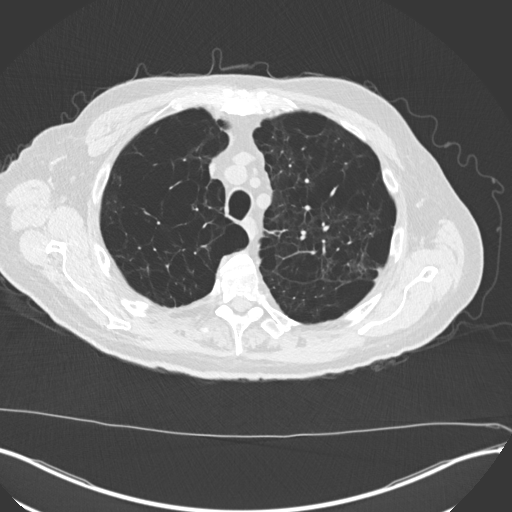
[im 133/158  lung]
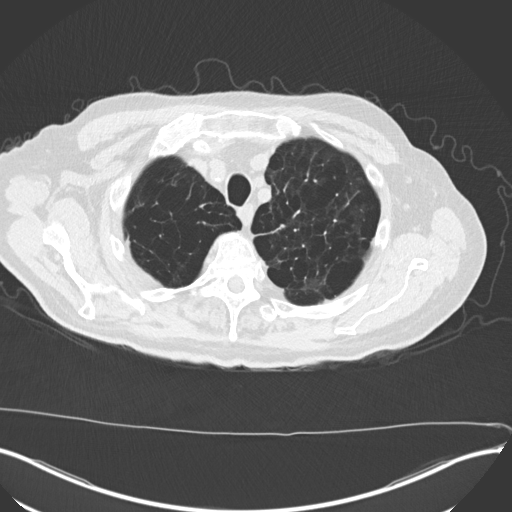
[im 145/158  lung]
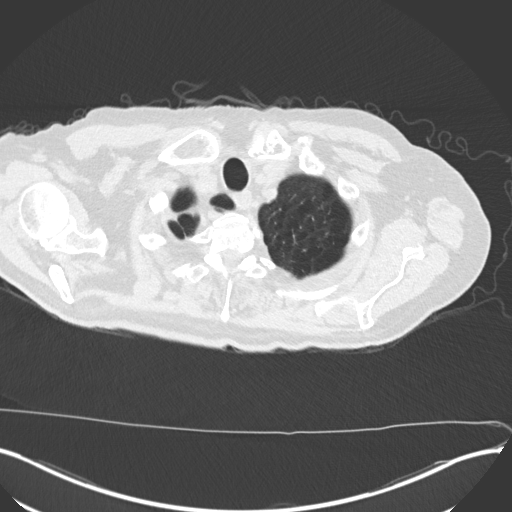

[Series 5: coronal · coronal · 0.72mm/px · 3 of 151 slices shown]
[im 31/151  lung]
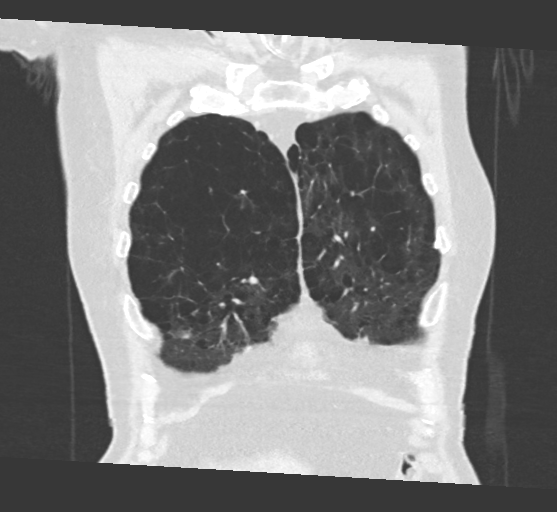
[im 61/151  lung]
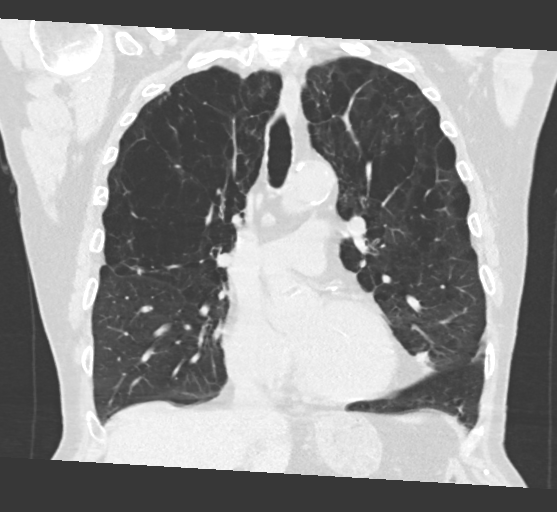
[im 91/151  lung]
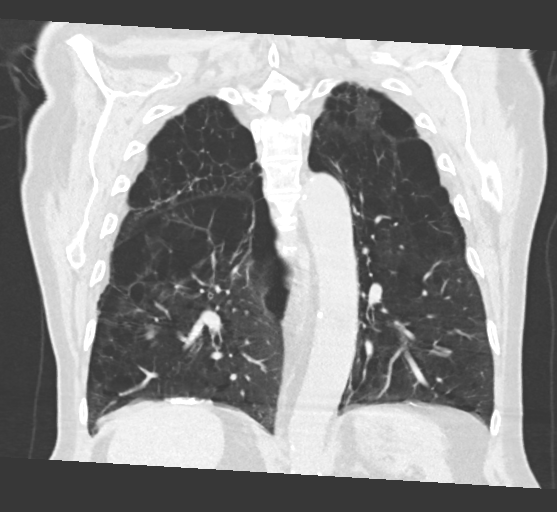

[15 of 36 positions shown; findings below may reference images not displayed]

FINDINGS: Cardiovascular: Heart size is normal. No pericardial fluid. Coronary
artery calcification is present. Aortic atherosclerotic
calcification is present.

Mediastinum/Nodes: No mediastinal or hilar mass or lymphadenopathy.

Lungs/Pleura: Widespread pleural plaques with variable
calcification, not visibly changed since the previous study. The
patient has bilateral Bochdalek's diaphragmatic defects, with an
increase in the amount of fat herniation since the study April 2020. At the right lung base, this is associated with a small amount
of pulmonary density, likely atelectasis. No evidence of mass,
nodule or infiltrate in the right lung. On the left, there is no
mass or nodule. No active infiltrate or collapse.

Upper Abdomen: Negative

Musculoskeletal: No acute finding. Chronic spinal degenerative
changes.
IMPRESSION: No evidence of pulmonary mass or nodule. Advanced widespread bullous
emphysema. Calcified pleural plaques, consistent with the history of
asbestos exposure.

Bilateral Bochdalek's hernias, with more fat herniation than was
seen in Monday April, 2020. Small area of adjacent atelectasis next to the
right hernia.

Aortic Atherosclerosis ([id number]). Coronary artery calcification
also present.

## 2023-06-03 DIAGNOSIS — J449 Chronic obstructive pulmonary disease, unspecified: Secondary | ICD-10-CM | POA: Diagnosis not present

## 2023-06-03 DIAGNOSIS — J61 Pneumoconiosis due to asbestos and other mineral fibers: Secondary | ICD-10-CM | POA: Diagnosis not present

## 2023-07-04 DIAGNOSIS — J61 Pneumoconiosis due to asbestos and other mineral fibers: Secondary | ICD-10-CM | POA: Diagnosis not present

## 2023-07-04 DIAGNOSIS — J449 Chronic obstructive pulmonary disease, unspecified: Secondary | ICD-10-CM | POA: Diagnosis not present

## 2023-07-11 ENCOUNTER — Telehealth: Payer: Self-pay | Admitting: *Deleted

## 2023-07-11 NOTE — Progress Notes (Signed)
Care Coordination   Note   07/11/2023 Name: Timothy [name] MRN: [id number] DOB: 08/27/1946  Timothy [name] is a 77 y.o. year old male who sees Vyas, Dhruv B, MD for primary care. I reached out to Timothy Cooks [place] by phone today to offer care coordination services.  [name] was given information about Care Coordination services today including:   The Care Coordination services include support from the care team which includes your Nurse Coordinator, Clinical Social Worker, or Pharmacist.  The Care Coordination team is here to help remove barriers to the health concerns and goals most important to you. Care Coordination services are voluntary, and the patient may decline or stop services at any time by request to their care team member.   Care Coordination Consent Status: Patient agreed to services and verbal consent obtained.   Follow up plan:  Telephone appointment with care coordination team member scheduled for:  08/06/23  Encounter Outcome:  Patient Scheduled [facility] Coordination Care Guide  Direct Dial: [phone]

## 2023-08-03 DIAGNOSIS — J61 Pneumoconiosis due to asbestos and other mineral fibers: Secondary | ICD-10-CM | POA: Diagnosis not present

## 2023-08-03 DIAGNOSIS — J449 Chronic obstructive pulmonary disease, unspecified: Secondary | ICD-10-CM | POA: Diagnosis not present

## 2023-08-06 ENCOUNTER — Ambulatory Visit: Payer: Self-pay | Admitting: *Deleted

## 2023-08-06 NOTE — Patient Outreach (Signed)
Care Coordination   08/06/2023 Name: SCARLETT HYACINTHE MRN: [id number] DOB: 1945/12/12   Care Coordination Outreach Attempts:  An unsuccessful telephone outreach was attempted for a scheduled appointment today.  Follow Up Plan:  Additional outreach attempts will be made to offer the patient care coordination information and services.   Encounter Outcome:  No Answer   Care Coordination Interventions:  No, not indicated. Staff message sent to scheduling care guide for outreach and rescheduling.    Demetrios Loll, RN, BSN Care Management Coordinator [facility]  [facility] Direct Dial: [phone] Main #: [phone]

## 2023-08-07 DIAGNOSIS — R5383 Other fatigue: Secondary | ICD-10-CM | POA: Diagnosis not present

## 2023-08-07 DIAGNOSIS — Z79899 Other long term (current) drug therapy: Secondary | ICD-10-CM | POA: Diagnosis not present

## 2023-08-07 DIAGNOSIS — Z299 Encounter for prophylactic measures, unspecified: Secondary | ICD-10-CM | POA: Diagnosis not present

## 2023-08-07 DIAGNOSIS — I10 Essential (primary) hypertension: Secondary | ICD-10-CM | POA: Diagnosis not present

## 2023-08-07 DIAGNOSIS — Z87891 Personal history of nicotine dependence: Secondary | ICD-10-CM | POA: Diagnosis not present

## 2023-08-07 DIAGNOSIS — Z0000 Encounter for general adult medical examination without abnormal findings: Secondary | ICD-10-CM | POA: Diagnosis not present

## 2023-08-07 DIAGNOSIS — Z23 Encounter for immunization: Secondary | ICD-10-CM | POA: Diagnosis not present

## 2023-08-07 DIAGNOSIS — E7800 Pure hypercholesterolemia, unspecified: Secondary | ICD-10-CM | POA: Diagnosis not present

## 2023-08-07 DIAGNOSIS — Z7189 Other specified counseling: Secondary | ICD-10-CM | POA: Diagnosis not present

## 2023-08-14 ENCOUNTER — Telehealth: Payer: Self-pay | Admitting: *Deleted

## 2023-08-14 NOTE — Progress Notes (Signed)
Care Coordination Note  08/14/2023 Name: LEDARRIUS ROSATI MRN: [id number] DOB: Aug 20, 1946  Weber Cooks Pino is a 77 y.o. year old male who is a primary care patient of Vyas, Dhruv B, MD and is actively engaged with the care management team. I reached out to Kathlee Nations by phone today to assist with re-scheduling an initial visit with the RN Case Manager  Follow up plan: Unsuccessful telephone outreach attempt made.  [facility]  Care Coordination Care Guide  Direct Dial: [phone]

## 2023-08-19 NOTE — Progress Notes (Signed)
Care Coordination Note  08/19/2023 Name: Timothy [name] MRN: [id number] DOB: December 14, 1945  Timothy [name] is a 77 y.o. year old male who is a primary care patient of Vyas, Dhruv B, MD and is actively engaged with the care management team. I reached out to Kathlee Nations by phone today to assist with re-scheduling an initial visit with the RN Case Manager  Follow up plan: Unsuccessful telephone outreach attempt made. A HIPAA compliant phone message was left for the patient providing contact information and requesting a return call.  We have been unable to make contact with the patient for follow up. The care management team is available to follow up with the patient after provider conversation with the patient regarding recommendation for care management engagement and subsequent re-referral to the care management team.   [facility] Coordination Care Guide  Direct Dial: [phone]

## 2023-08-21 DIAGNOSIS — R2990 Unspecified symptoms and signs involving the nervous system: Secondary | ICD-10-CM | POA: Diagnosis not present

## 2023-08-21 DIAGNOSIS — Z79899 Other long term (current) drug therapy: Secondary | ICD-10-CM | POA: Diagnosis not present

## 2023-08-21 DIAGNOSIS — Z7902 Long term (current) use of antithrombotics/antiplatelets: Secondary | ICD-10-CM | POA: Diagnosis not present

## 2023-08-21 DIAGNOSIS — I10 Essential (primary) hypertension: Secondary | ICD-10-CM | POA: Diagnosis not present

## 2023-08-21 DIAGNOSIS — I639 Cerebral infarction, unspecified: Secondary | ICD-10-CM | POA: Diagnosis not present

## 2023-08-21 DIAGNOSIS — R0600 Dyspnea, unspecified: Secondary | ICD-10-CM | POA: Diagnosis not present

## 2023-08-21 DIAGNOSIS — Z87891 Personal history of nicotine dependence: Secondary | ICD-10-CM | POA: Diagnosis not present

## 2023-08-21 DIAGNOSIS — J9610 Chronic respiratory failure, unspecified whether with hypoxia or hypercapnia: Secondary | ICD-10-CM | POA: Diagnosis not present

## 2023-08-21 DIAGNOSIS — J9611 Chronic respiratory failure with hypoxia: Secondary | ICD-10-CM | POA: Diagnosis not present

## 2023-08-21 DIAGNOSIS — J439 Emphysema, unspecified: Secondary | ICD-10-CM | POA: Diagnosis not present

## 2023-08-21 DIAGNOSIS — J449 Chronic obstructive pulmonary disease, unspecified: Secondary | ICD-10-CM | POA: Diagnosis not present

## 2023-08-21 DIAGNOSIS — Z9981 Dependence on supplemental oxygen: Secondary | ICD-10-CM | POA: Diagnosis not present

## 2023-08-21 DIAGNOSIS — H547 Unspecified visual loss: Secondary | ICD-10-CM | POA: Diagnosis not present

## 2023-08-21 DIAGNOSIS — H538 Other visual disturbances: Secondary | ICD-10-CM | POA: Diagnosis not present

## 2023-08-21 DIAGNOSIS — Z8673 Personal history of transient ischemic attack (TIA), and cerebral infarction without residual deficits: Secondary | ICD-10-CM | POA: Diagnosis not present

## 2023-08-21 DIAGNOSIS — I6782 Cerebral ischemia: Secondary | ICD-10-CM | POA: Diagnosis not present

## 2023-08-21 DIAGNOSIS — R4781 Slurred speech: Secondary | ICD-10-CM | POA: Diagnosis not present

## 2023-08-21 DIAGNOSIS — Z66 Do not resuscitate: Secondary | ICD-10-CM | POA: Diagnosis not present

## 2023-08-21 DIAGNOSIS — E785 Hyperlipidemia, unspecified: Secondary | ICD-10-CM | POA: Diagnosis not present

## 2023-08-21 DIAGNOSIS — R519 Headache, unspecified: Secondary | ICD-10-CM | POA: Diagnosis not present

## 2023-08-22 DIAGNOSIS — G9389 Other specified disorders of brain: Secondary | ICD-10-CM | POA: Diagnosis not present

## 2023-08-22 DIAGNOSIS — I10 Essential (primary) hypertension: Secondary | ICD-10-CM | POA: Diagnosis not present

## 2023-08-22 DIAGNOSIS — R29818 Other symptoms and signs involving the nervous system: Secondary | ICD-10-CM | POA: Diagnosis not present

## 2023-08-22 DIAGNOSIS — Z9981 Dependence on supplemental oxygen: Secondary | ICD-10-CM | POA: Diagnosis not present

## 2023-08-22 DIAGNOSIS — J9611 Chronic respiratory failure with hypoxia: Secondary | ICD-10-CM | POA: Diagnosis not present

## 2023-08-22 DIAGNOSIS — Z8673 Personal history of transient ischemic attack (TIA), and cerebral infarction without residual deficits: Secondary | ICD-10-CM | POA: Diagnosis not present

## 2023-08-22 DIAGNOSIS — I6782 Cerebral ischemia: Secondary | ICD-10-CM | POA: Diagnosis not present

## 2023-08-22 DIAGNOSIS — J449 Chronic obstructive pulmonary disease, unspecified: Secondary | ICD-10-CM | POA: Diagnosis not present

## 2023-08-22 DIAGNOSIS — R519 Headache, unspecified: Secondary | ICD-10-CM | POA: Diagnosis not present

## 2023-08-22 NOTE — Consults (Signed)
 Duplicate order. Patient seen for swallow evaluation earlier this date. Please see report for details.

## 2023-08-22 NOTE — Consults (Signed)
 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 Patient self-administered all trials and demonstrated no overt signs/symptoms of aspiration. No diet texture modifications indicated. Notified RN and attending physician, as patient did not have a diet order at time of evaluation. No further acute ST services for swallowing indicated. Recommend referral for additional cognitive-linguistic testing as an outpatient; patient amenable to referral and wishes to discuss with family. At time of visit, patient reports that his family assists with all iADLs including managing medical appointments, transportation needs, cooking, managing medications and managing finances. Recommend that patient continue to receive this level of assistance at discharge. Patient is making wants and needs known through verbal communication.  Risk for Aspiration:  Low  Recommended Strategies: Orientation aids (e.g., calendar, clock), External memory aids  Recommendations:  PO Diet, Frequent Oral Care   Diet Liquids Recommendations: No Restrictions   Diet Solids Recommendation: Regular Consistency Solids   Recommended Form of Medications: Whole, With liquid (per patient preference)   Recommended Compensatory Techniques : Slow rate, Small sips/bites, Upright 90 degrees, Upright 30 min after meal  Prognosis: Good Positive Indicators: PLOF Barriers to Discharge: Endurance deficits, Functional strength deficits, Gait instability, Impaired Balance, Inability to  safely perform ADLS   Plan of Care SLP Daily Frequency: D/C Services Patient and Family Goal: I'm hungry  Subjective Medical Updates Since Last Visit/Relevant PMH Affecting Clinical Decision Making: [name] is a 77 y/o male admitted to [facility] on 08/21/23 with c/o stroke-like symptoms including changes in speech and headaches. PMH includes h/o multiple strokes, COPD, hypertension. Speech pathology service consulted for clinical swallow evaluation (CSE). Due to nature of c/o changes in speech, discussed speech/language complaints with Dr. Rosena and cognitive and speech/language screenings completed this visit as well. No prior ST services at [facility], per chart review.  Prior Function: Required assistance to manage finances, Required assistance to manage medications, Did not drive prior to admission  Other Prior Function Comments: Patient reports that his son cooks all of his meals, his spouse takes him to his medical appointments, and his son administers his medications to him every day. Patient states, My family takes good care of me. Patient reports that he has worked with speech therapy in the past and was told that there was nothing more they could do. Unable to see any history of ST notes in chart review, question whether this was at a rehab facility or other outside facility.  Communication Preference: Verbal Patient/Caregiver Reports: Patient presents awake and alert, oriented to self, place, situation, month, year and day of week. Patient shown clock on wall and whiteboard and was able to state today's date and the correct time. Patient endorses trouble collecting his thoughts and getting words out when he speaks. He reports that it was worse yesterday and is getting a little better. He reports difficulty with memory since his stroke about a year and a half ago. He denies difficulty chewing and swallowing. Pain: no s/s of pain      Allergies:  Codeine Current Facility-Administered Medications  Medication Dose Route Frequency Provider  Last Rate Last Admin  . acetaminophen  (TYLENOL ) tablet 650 mg  650 mg Oral Q4H PRN Bhatti, Jamila Shaheen, MD      . albuterol  2.5 mg /3 mL (0.083 %) nebulizer solution 2.5 mg  2.5 mg Nebulization QID PRN Bhatti, Jamila Shaheen, MD      . amlodipine (NORVASC) tablet 5 mg  5 mg Oral Daily Bhatti, Jamila Shaheen, MD   5 mg at 08/22/23 0914  . atorvastatin  (LIPITOR) tablet 40 mg  40 mg Oral Daily Bhatti, Jamila Shaheen, MD      . citalopram (CeleXA) tablet 20 mg  20 mg Oral Daily Bhatti, Jamila Shaheen, MD   20 mg at 08/22/23 0914  . clopidogrel  (PLAVIX ) tablet 75 mg  75 mg Oral Daily Bhatti, Jamila Shaheen, MD   75 mg at 08/22/23 0914  . enoxaparin  (LOVENOX ) syringe 40 mg  40 mg Subcutaneous Q24H Bhatti, Jamila Shaheen, MD   40 mg at 08/22/23 0916  . ipratropium-albuterol  (DUO-NEB) 0.5-2.5 mg/3 mL nebulizer solution 3 mL  3 mL Nebulization Q6H (RT) Bhatti, Tyronne Larger, MD   3 mL at 08/22/23 0912  . melatonin tablet 3 mg  3 mg Oral Nightly PRN Bhatti, Jamila Shaheen, MD      . ondansetron  (ZOFRAN -ODT) disintegrating tablet 4 mg  4 mg Oral Q8H PRN Bhatti, Jamila Shaheen, MD      . polyethylene glycol (MIRALAX) packet 17 g  17 g Oral Daily PRN Bhatti, Jamila Shaheen, MD      . senna [facility]) tablet 2 tablet  2 tablet Oral Nightly PRN Bhatti, Jamila Shaheen, MD       Past Medical History:  Diagnosis Date  . Asbestosis (CMS-HCC)   . COPD (chronic obstructive pulmonary disease) (CMS-HCC)   . Hyperlipidemia   . Hypertension 02/06/2018   Formatting of this note might be different from the original. 2010-prescribed Lisinopril, non-complinant with medication  . Oxygen  dependent   . TIA (transient ischemic attack)    Patient recently had an MRI that showed three acute or subacute infarctions in the left cerebral hemisphere   History reviewed. No pertinent family history. Past Surgical History:  Procedure  Laterality Date  . HEMORRHIOD    . HERNIA REPAIR    . VASECTOMY     Social History   Tobacco Use  . Smoking status: Former  . Smokeless tobacco: Never  Substance Use Topics  . Alcohol use: Not Currently    Comment: OCCASIONAL   General: Hearing Exceptions: None                Self-Feeding Capacity: Functional for self-feeding                 Medical Tests / Procedures Comments: MRI Brain Wo Contrast 08/22/23: Impression  Chronic severe ischemic disease with no acute intracranial  abnormality.  WBC 08/21/23: 7.0, which is within normal range (4.0-10.5). Equipment/Environment: Vascular access (PIV, TLC, Port-a-cath, PICC, Supplemental oxygen  (2L O2 via nasal cannula) Precautions / Restrictions Precautions: Falls precautions Weight Bearing Status: Non-applicable Required Braces or Orthoses: Non-applicable  Objective Temperature Spikes Noted: No Respiratory Status : O2 via nasal cannula History of Intubation: No Behavior/Cognition: Alert, Cooperative, Pleasant mood Positioning : Upright in bed (seated edge of bed) Cognitive screening: Impaired; inconsistent recall of information; disoriented to date and listed several dates before being redirected to the whiteboard which contained orientation information Language screening: disorganized thinking, delayed responses; pauses/hesitations in running speech; patient states, I'm tired of stuttering and reports speech has been this  way for some time.   Oral / Motor Exam Vocal Quality: Normal Volitional Swallow: Within Functional Limits  Labial ROM: Within Functional Limits  Labial Symmetry: Within Functional Limits Labial Strength: Within Functional Limits  Lingual ROM: Within Functional Limits Lingual Symmetry: Within Functional Limits Lingual Strength: Within Functional Limits  Lingual Sensation: Within Functional Limits Velum: elevation grossly WNL  Mandible: Within Functional Limits Facial ROM: Within Functional Limits   Facial Symmetry: Within Functional Limits Facial Strength: Within Functional Limits Facial Sensation: Within Functional Limits  Vocal Intensity: Within Functional Limits  Intelligibility: Intelligible  Breath Support: Adequate for speech  Dentition: Adequate, Some missing teeth  Consistencies assessed: While seated edge of bed, the patient self-administered 8oz water (thin liquid), 4oz container of applesauce and entire packet of graham crackers. Oral: Functional mastication with full clearance of solids from oral cavity upon completion of swallow effort. No labial spillage observed. Able to suck from straw. Pharyngeal: No immediate or delayed coughing or throat clearing observed. Patient denies c/o globus.  Patient at end of session: All needs in reach, Alarm activated, In bed, Lines intact  Speech Therapy Session Duration SLP Individual [mins]: 10  I attest that I have reviewed the above information. Signed: Ellouise Luke, SLP  Filed 08/22/2023

## 2023-09-03 DIAGNOSIS — J61 Pneumoconiosis due to asbestos and other mineral fibers: Secondary | ICD-10-CM | POA: Diagnosis not present

## 2023-09-03 DIAGNOSIS — J449 Chronic obstructive pulmonary disease, unspecified: Secondary | ICD-10-CM | POA: Diagnosis not present

## 2023-10-03 DIAGNOSIS — J449 Chronic obstructive pulmonary disease, unspecified: Secondary | ICD-10-CM | POA: Diagnosis not present

## 2023-10-03 DIAGNOSIS — J61 Pneumoconiosis due to asbestos and other mineral fibers: Secondary | ICD-10-CM | POA: Diagnosis not present

## 2023-10-14 DIAGNOSIS — J9611 Chronic respiratory failure with hypoxia: Secondary | ICD-10-CM | POA: Diagnosis not present

## 2023-10-14 DIAGNOSIS — K4090 Unilateral inguinal hernia, without obstruction or gangrene, not specified as recurrent: Secondary | ICD-10-CM | POA: Diagnosis not present

## 2023-10-14 DIAGNOSIS — I10 Essential (primary) hypertension: Secondary | ICD-10-CM | POA: Diagnosis not present

## 2023-10-14 DIAGNOSIS — Z299 Encounter for prophylactic measures, unspecified: Secondary | ICD-10-CM | POA: Diagnosis not present

## 2023-10-14 DIAGNOSIS — J449 Chronic obstructive pulmonary disease, unspecified: Secondary | ICD-10-CM | POA: Diagnosis not present

## 2023-11-03 DIAGNOSIS — J61 Pneumoconiosis due to asbestos and other mineral fibers: Secondary | ICD-10-CM | POA: Diagnosis not present

## 2023-11-03 DIAGNOSIS — J449 Chronic obstructive pulmonary disease, unspecified: Secondary | ICD-10-CM | POA: Diagnosis not present

## 2023-11-04 DIAGNOSIS — K4090 Unilateral inguinal hernia, without obstruction or gangrene, not specified as recurrent: Secondary | ICD-10-CM | POA: Diagnosis not present

## 2023-12-04 DIAGNOSIS — J449 Chronic obstructive pulmonary disease, unspecified: Secondary | ICD-10-CM | POA: Diagnosis not present

## 2023-12-04 DIAGNOSIS — J61 Pneumoconiosis due to asbestos and other mineral fibers: Secondary | ICD-10-CM | POA: Diagnosis not present

## 2023-12-15 DIAGNOSIS — J9611 Chronic respiratory failure with hypoxia: Secondary | ICD-10-CM | POA: Diagnosis not present

## 2023-12-15 DIAGNOSIS — J441 Chronic obstructive pulmonary disease with (acute) exacerbation: Secondary | ICD-10-CM | POA: Diagnosis not present

## 2023-12-15 DIAGNOSIS — I714 Abdominal aortic aneurysm, without rupture, unspecified: Secondary | ICD-10-CM | POA: Diagnosis not present

## 2023-12-15 DIAGNOSIS — I10 Essential (primary) hypertension: Secondary | ICD-10-CM | POA: Diagnosis not present

## 2023-12-15 DIAGNOSIS — Z299 Encounter for prophylactic measures, unspecified: Secondary | ICD-10-CM | POA: Diagnosis not present

## 2023-12-15 DIAGNOSIS — I700 Atherosclerosis of aorta: Secondary | ICD-10-CM | POA: Diagnosis not present

## 2024-01-01 DIAGNOSIS — J449 Chronic obstructive pulmonary disease, unspecified: Secondary | ICD-10-CM | POA: Diagnosis not present

## 2024-01-01 DIAGNOSIS — J61 Pneumoconiosis due to asbestos and other mineral fibers: Secondary | ICD-10-CM | POA: Diagnosis not present

## 2024-01-06 DIAGNOSIS — N183 Chronic kidney disease, stage 3 unspecified: Secondary | ICD-10-CM | POA: Diagnosis not present

## 2024-01-06 DIAGNOSIS — Z299 Encounter for prophylactic measures, unspecified: Secondary | ICD-10-CM | POA: Diagnosis not present

## 2024-01-06 DIAGNOSIS — J61 Pneumoconiosis due to asbestos and other mineral fibers: Secondary | ICD-10-CM | POA: Diagnosis not present

## 2024-01-06 DIAGNOSIS — I10 Essential (primary) hypertension: Secondary | ICD-10-CM | POA: Diagnosis not present

## 2024-01-06 DIAGNOSIS — J9611 Chronic respiratory failure with hypoxia: Secondary | ICD-10-CM | POA: Diagnosis not present

## 2024-01-13 ENCOUNTER — Encounter (HOSPITAL_BASED_OUTPATIENT_CLINIC_OR_DEPARTMENT_OTHER): Payer: Self-pay | Admitting: Pulmonary Disease

## 2024-01-13 ENCOUNTER — Ambulatory Visit (HOSPITAL_BASED_OUTPATIENT_CLINIC_OR_DEPARTMENT_OTHER): Payer: Medicare Other | Admitting: Pulmonary Disease

## 2024-01-13 VITALS — BP 155/88 | HR 61 | Ht 68.0 in | Wt 143.3 lb

## 2024-01-13 DIAGNOSIS — Z87891 Personal history of nicotine dependence: Secondary | ICD-10-CM

## 2024-01-13 DIAGNOSIS — J439 Emphysema, unspecified: Secondary | ICD-10-CM | POA: Diagnosis not present

## 2024-01-13 MED ORDER — BREZTRI AEROSPHERE 160-9-4.8 MCG/ACT IN AERO: 2.0000 | INHALATION_SPRAY | Freq: Two times a day (BID) | RESPIRATORY_TRACT | Status: DC

## 2024-01-13 MED ORDER — BREZTRI AEROSPHERE 160-9-4.8 MCG/ACT IN AERO: 2.0000 | INHALATION_SPRAY | Freq: Two times a day (BID) | RESPIRATORY_TRACT | 11 refills | Status: DC

## 2024-01-13 NOTE — Patient Instructions (Addendum)
  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 Belfield, risk of perioperative pulmonary complications is increased by:  [X]  Age greater than 65 years  [X]  COPD  [ ]  Serum albumin <3.5  [ ]  Smoking  [ ] Obstructive sleep apnea  [ ]  NYHA Class II Pulmonary Hypertension  ARISCAT: Low risk 1.6% risk of in-hospital post-op pulmonary complications (composite including respiratory failure, respiratory infection, pleural effusion, atelectasis, pneumothorax, bronchospasm, aspiration pneumonitis)  Respiratory complications generally occur in 1% of ASA Class I patients, 5% of ASA Class II and 10% of ASA Class III-IV patients These complications rarely result in mortality and include postoperative pneumonia, atelectasis, pulmonary embolism, ARDS and increased time requiring postoperative mechanical ventilation.  Overall, I recommend proceeding with the surgery if the risk for respiratory complications are outweighed by the potential benefits. This will need to be discussed between the patient and surgeon.  To reduce risks of respiratory complications, I recommend: --Pre- and post-operative incentive spirometry performed frequently while awake --Avoiding/minimizing use of paralytics if possible during anesthesia.  1) RISK FOR PROLONGED MECHANICAL VENTILAION - > 48h   1A) Arozullah - Prolonged mech ventilation risk Arozullah Postperative Pulmonary Risk Score - for mech ventilation dependence >48h USAA, Ann Surg 2000, major non-cardiac surgery) Comment Score  Type of surgery - abd ao aneurysm (27), thoracic (21), neurosurgery / upper abdominal / vascular (21), neck (11)    Emergency Surgery - (11)     ALbumin < 3 or poor nutritional state - (9)     BUN > 30 -  (8)    Partial or completely  dependent functional status - (7)     COPD -  (6) COPD  6  Age - 60 to 69 (4), > 70  (6) >70 6  TOTAL     Risk Stratifcation scores  - < 10 (0.5%), 11-19 (1.8%), 20-27 (4.2%), 28-40 (10.1%), >40 (26.6%)   12    Although patient is considered low-risk by ARISCAT and AROZULLAH numerically, he continues to pose a risk for post-op pulmonary complications in setting of his severe COPD with bullous emphysema. Would avoid ventilator if able but if recommended by anesthesia, limiting time on ventilator if able. I have discussed the risk factors and recommendations above with the patient.

## 2024-01-13 NOTE — Progress Notes (Unsigned)
 Subjective:   PATIENT ID: Timothy [name] GENDER: male DOB: 12/10/1945, MRN: [id number]   HPI  Chief Complaint  Patient presents with   Follow-up    Emphysema, COPD, Surgical Clearance for hernia repair    Reason for Visit: Follow-up COPD  [name] is a 78 year old retired male with COPD, chronic hypoxemic respiratory failure, HTN, CVA 03/2022 who presents for follow-up.  06/18/21 He is compliant with Trelegy. Denies coughing or wheezing. Shortness of breath with exertion so activity remains limited but able to ambulate within house and perform daily tasks. He is compliant with his oxygen.  He will uses the albuterol three times a week, 2-3 puffs which is improved compared to our last visit. Symptoms usually triggered with exertion or changes in weather. He reports occasional dizziness and unsure if this is related to his blood pressure medications.   11/19/21 Since his last visit he reports worsening shortness of breath. He is starting to wheezing with exertion. Occasional cough sometimes with sputum production. He is also having worsening back and joint pain. Had to go to the ED on 1/20 and treated with pain medication. He uses his albuterol twice a day. Has been compliant with Trelegy and feels this is not working. He is compliant with his oxygen and on 2L. Although symptomatic, denies recent exacerbations requiring steroids or antibiotics in the last 5 months.  01/13/24 Patient has been lost to follow-up for >2 years. He was last seen in 10/29/21. He presents for pre-op clearance for right inguinal hernia repair. He has chronic shortness of breath. Denies chronic cough or wheezing. Previously wore oxygen but not currently needing any. He is compliant with Breztri but only has samples.   Social History: Quit in 2004. 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   Past Medical History:  Diagnosis Date   COPD (chronic obstructive pulmonary disease) (HCC)     Essential hypertension    History of asbestosis 2010   History of cardiac catheterization 2012   Reportedly normal coronaries   Respiratory failure with hypoxia (HCC)    Home oxygen     Family History  Problem Relation Age of Onset   Lung disease Mother        Asbestosis.   Pancreatic cancer Father    Breast cancer Sister      Social History   Occupational History   Not on file  Tobacco Use   Smoking status: Former    Current packs/day: 0.00    Average packs/day: 1 pack/day for 42.0 years (42.0 ttl pk-yrs)    Types: Cigarettes    Start date: 67    Quit date: 2004    Years since quitting: 21.2   Smokeless tobacco: Never  Vaping Use   Vaping status: Never Used  Substance and Sexual Activity   Alcohol use: Yes    Alcohol/week: 1.0 standard drink of alcohol    Types: 1 Cans of beer per week    Comment: occas   Drug use: No   Sexual activity: Not on file    Allergies  Allergen Reactions   Codeine Itching     Outpatient Medications Prior to Visit  Medication Sig Dispense Refill   albuterol (VENTOLIN HFA) 108 (90 Base) MCG/ACT inhaler Inhale 2 puffs into the lungs every 6 (six) hours as needed for wheezing or shortness of breath. 18 g 3   amLODipine (NORVASC) 5 MG tablet Take 5 mg by mouth daily.  atorvastatin (LIPITOR) 40 MG tablet Take 1 tablet (40 mg total) by mouth daily. 90 tablet 2   citalopram (CELEXA) 20 MG tablet Take 20 mg by mouth at bedtime.     clopidogrel (PLAVIX) 75 MG tablet Take 75 mg by mouth daily.     ipratropium-albuterol (DUONEB) 0.5-2.5 (3) MG/3ML SOLN Take 3 mLs by nebulization every 6 (six) hours as needed. 360 mL 5   lisinopril (ZESTRIL) 10 MG tablet Take 10 mg by mouth daily.     OXYGEN 2 LPM     alendronate (FOSAMAX) 70 MG tablet Take 70 mg by mouth once a week. (Patient not taking: Reported on 01/13/2024)     atorvastatin (LIPITOR) 40 MG tablet Take 1 tablet (40 mg total) by mouth daily. 30 tablet 11   gabapentin (NEURONTIN) 100 MG  capsule Take 100 mg by mouth in the morning, at noon, and at bedtime. (Patient not taking: Reported on 01/13/2024)     risperiDONE (RISPERDAL) 0.5 MG tablet Take 0.5 mg by mouth 2 (two) times daily. (Patient not taking: Reported on 01/13/2024)     Fluticasone-Umeclidin-Vilant (TRELEGY ELLIPTA) 200-62.5-25 MCG/ACT AEPB Inhale 1 puff into the lungs daily at 2 PM. (Patient not taking: Reported on 01/13/2024) 60 each 5   No facility-administered medications prior to visit.   Review of Systems  Constitutional:  Negative for chills, diaphoresis, fever, malaise/fatigue and weight loss.  HENT:  Negative for congestion.   Respiratory:  Positive for shortness of breath. Negative for cough, hemoptysis, sputum production and wheezing.   Cardiovascular:  Negative for chest pain, palpitations and leg swelling.    Objective:   Vitals:   01/13/24 1353  BP: (!) 155/88  Pulse: 61  SpO2: 92%  Weight: 143 lb 4.8 oz (65 kg)  Height: 5\' 8"  (1.727 m)   SpO2: 92 %  Physical Exam: General: Well-appearing, no acute distress HENT: [place], AT Eyes: EOMI, no scleral icterus Respiratory: Pursed lip breathing. Diminished but clear to auscultation bilaterally.  No crackles, wheezing or rales Cardiovascular: RRR, -M/R/G, no JVD Extremities:-Edema,-tenderness Neuro: AAO x4, CNII-XII grossly intact Psych: Normal mood, normal affect  Data Reviewed:  Imaging: 11/17/18 CT Chest in PACS reviewed: 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 Unable to visualize 3mm pulmonary nodule in RLL mentioned in report due to architectural distortion CTA 05/24/20 (report only) - Calcified pleural plaques bilaterally. 08/29/21 - CT Chest - Calcified pleural plaques. Bullous emphysema. 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 Reviewed history per EMR including prior hospitalizations with patient and ex-wife. Symptomatic when off bronchodilators. Discussed clinical course and management of COPD including bronchodilator regimen, preventive care and action plan for exacerbation. Discussed pre-op clearance. Although patient low-risk numerically he needs to be compliant with current regimen and remains at risk for post-op pulmonary complications.  Trelegy - failed to control symptoms despite compliance.   Severe COPD with bullous emphysema - uncontrolled --START Breztri TWO puffs TWICE a day  >Please fill out patient assistance forms --CONTINUE Albuterol AS NEEDED  Bilateral calcified pleural plaques, Asbestosis Exposure,  3mm pulmonary nodule - resolved --Reviewed CT from 08/2021 --No further imaging indicated  Peri-operative Assessment of Pulmonary Risk for Non-Thoracic Surgery:  For Mr. Baiz, risk of perioperative pulmonary complications is increased by:  [X]  Age greater than 65 years  [X]  COPD  [ ]   Serum albumin <3.5  [ ]  Smoking  [ ] Obstructive sleep apnea  [ ]  NYHA Class II Pulmonary Hypertension  ARISCAT: Low risk 1.6% risk of in-hospital post-op pulmonary complications (composite including respiratory failure, respiratory infection, pleural effusion, atelectasis, pneumothorax, bronchospasm, aspiration pneumonitis)  Respiratory complications generally occur in 1% of ASA Class I patients, 5% of ASA Class II and 10% of ASA Class III-IV patients These complications rarely result in mortality and include postoperative pneumonia, atelectasis, pulmonary embolism, ARDS and increased time requiring postoperative mechanical ventilation.  Overall, I recommend proceeding with the surgery if the risk for respiratory complications are outweighed by the potential benefits. This will need to be discussed between the patient and surgeon.  To reduce risks of respiratory complications, I  recommend: --Pre- and post-operative incentive spirometry performed frequently while awake --Avoiding/minimizing use of paralytics if possible during anesthesia.  1) RISK FOR PROLONGED MECHANICAL VENTILAION - > 48h   1A) Arozullah - Prolonged mech ventilation risk Arozullah Postperative Pulmonary Risk Score - for mech ventilation dependence >48h USAA, Ann Surg 2000, major non-cardiac surgery) Comment Score  Type of surgery - abd ao aneurysm (27), thoracic (21), neurosurgery / upper abdominal / vascular (21), neck (11)    Emergency Surgery - (11)     ALbumin < 3 or poor nutritional state - (9)     BUN > 30 -  (8)    Partial or completely dependent functional status - (7)     COPD -  (6) COPD  6  Age - 60 to 69 (4), > 70  (6) >70 6  TOTAL     Risk Stratifcation scores  - < 10 (0.5%), 11-19 (1.8%), 20-27 (4.2%), 28-40 (10.1%), >40 (26.6%)   12    Although patient is considered low-risk by ARISCAT and AROZULLAH numerically, he continues to pose a risk for post-op pulmonary complications in setting of his severe COPD with bullous emphysema. Would avoid ventilator if able but if recommended by anesthesia, limiting time on ventilator if able. I have discussed the risk factors and recommendations above with the patient.   Health Maintenance Immunization History  Administered Date(s) Administered   Fluad Quad(high Dose 65+) 09/15/2019, 10/16/2020   Influenza Split 08/16/2013   Influenza Whole 08/09/2018   Influenza, High Dose Seasonal PF 08/26/2015   Influenza,inj,Quad PF,6+ Mos 11/07/2014, 09/22/2017, 08/19/2018   PFIZER(Purple Top)SARS-COV-2 Vaccination 01/24/2020, 02/18/2020   Pneumococcal Conjugate-13 08/26/2015   Pneumococcal Polysaccharide-23 08/12/2012    Orders Placed This Encounter  Procedures   Pulmonary function test    Standing Status:   Future    Expiration Date:   01/12/2025    Where should this test be performed?:   [facility]    Full PFT: includes the  following: basic spirometry, spirometry pre & post bronchodilator, diffusion capacity (DLCO), lung volumes:   Full PFT   Meds ordered this encounter  Medications   budeson-glycopyrrolate-formoterol (BREZTRI AEROSPHERE) 160-9-4.8 MCG/ACT AERO    Sig: Inhale 2 puffs into the lungs in the morning and at bedtime.    Dispense:  10.7 g    Refill:  11   Return in about 3 months (around 04/14/2024) for with NP at [facility], after PFT.  I have spent a total time of 45-minutes on the day of the appointment including chart review, data review, collecting history, coordinating care and discussing medical diagnosis and plan with the patient/family. Past medical history, allergies, medications were reviewed. Pertinent imaging, labs and tests included in this note have been reviewed and  interpreted independently by me.  Clare Fennimore Mechele Collin, MD [facility] 01/13/2024 2:37 PM

## 2024-01-27 DIAGNOSIS — Z299 Encounter for prophylactic measures, unspecified: Secondary | ICD-10-CM | POA: Diagnosis not present

## 2024-01-27 DIAGNOSIS — I10 Essential (primary) hypertension: Secondary | ICD-10-CM | POA: Diagnosis not present

## 2024-01-27 DIAGNOSIS — D692 Other nonthrombocytopenic purpura: Secondary | ICD-10-CM | POA: Diagnosis not present

## 2024-01-27 DIAGNOSIS — J441 Chronic obstructive pulmonary disease with (acute) exacerbation: Secondary | ICD-10-CM | POA: Diagnosis not present

## 2024-01-27 DIAGNOSIS — J449 Chronic obstructive pulmonary disease, unspecified: Secondary | ICD-10-CM | POA: Diagnosis not present

## 2024-01-29 ENCOUNTER — Ambulatory Visit: Payer: Medicare Other | Admitting: Neurology

## 2024-01-29 ENCOUNTER — Encounter: Payer: Self-pay | Admitting: Neurology

## 2024-01-29 VITALS — BP 152/83 | HR 78 | Ht 68.0 in | Wt 143.0 lb

## 2024-01-29 DIAGNOSIS — Z79899 Other long term (current) drug therapy: Secondary | ICD-10-CM | POA: Diagnosis not present

## 2024-01-29 DIAGNOSIS — Z5181 Encounter for therapeutic drug level monitoring: Secondary | ICD-10-CM | POA: Diagnosis not present

## 2024-01-29 DIAGNOSIS — I6529 Occlusion and stenosis of unspecified carotid artery: Secondary | ICD-10-CM

## 2024-01-29 DIAGNOSIS — Z8673 Personal history of transient ischemic attack (TIA), and cerebral infarction without residual deficits: Secondary | ICD-10-CM

## 2024-01-29 DIAGNOSIS — R799 Abnormal finding of blood chemistry, unspecified: Secondary | ICD-10-CM | POA: Diagnosis not present
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
## 2024-01-29 NOTE — Patient Instructions (Signed)
 Will check kidney function and lipid panel If normal kidney function, we will obtain CTA head and neck, and if low function, then we will obtain carotid US.  Please have referring provider send the clearance form  Return as needed

## 2024-01-30 LAB — LIPID PANEL
Chol/HDL Ratio: 2.6 ratio (ref 0.0–5.0)
Cholesterol, Total: 140 mg/dL (ref 100–199)
HDL: 53 mg/dL (ref >39)
LDL Chol Calc (NIH): 72 mg/dL (ref 0–99)
Triglycerides: 74 mg/dL (ref 0–149)
VLDL Cholesterol Cal: 15 mg/dL (ref 5–40)

## 2024-01-30 LAB — BASIC METABOLIC PANEL WITH GFR
BUN/Creatinine Ratio: 19 (ref 10–24)
BUN: 18 mg/dL (ref 8–27)
CO2: 23 mmol/L (ref 20–29)
Calcium: 9.1 mg/dL (ref 8.6–10.2)
Chloride: 104 mmol/L (ref 96–106)
Creatinine, Ser: 0.97 mg/dL (ref 0.76–1.27)
Glucose: 97 mg/dL (ref 70–99)
Potassium: 4.9 mmol/L (ref 3.5–5.2)
Sodium: 140 mmol/L (ref 134–144)
eGFR: 80 mL/min/{1.73_m2} (ref >59)

## 2024-02-01 ENCOUNTER — Other Ambulatory Visit: Payer: Self-pay | Admitting: Neurology

## 2024-02-01 ENCOUNTER — Encounter: Payer: Self-pay | Admitting: Neurology

## 2024-02-01 DIAGNOSIS — J61 Pneumoconiosis due to asbestos and other mineral fibers: Secondary | ICD-10-CM | POA: Diagnosis not present

## 2024-02-01 DIAGNOSIS — J449 Chronic obstructive pulmonary disease, unspecified: Secondary | ICD-10-CM | POA: Diagnosis not present

## 2024-02-01 DIAGNOSIS — Z8673 Personal history of transient ischemic attack (TIA), and cerebral infarction without residual deficits: Secondary | ICD-10-CM

## 2024-02-04 ENCOUNTER — Telehealth: Payer: Self-pay | Admitting: Neurology

## 2024-02-04 NOTE — Telephone Encounter (Signed)
 no auth required sent to [facility] [phone]

## 2024-02-12 DIAGNOSIS — I10 Essential (primary) hypertension: Secondary | ICD-10-CM | POA: Diagnosis not present

## 2024-02-12 DIAGNOSIS — J449 Chronic obstructive pulmonary disease, unspecified: Secondary | ICD-10-CM | POA: Diagnosis not present

## 2024-02-12 DIAGNOSIS — Z299 Encounter for prophylactic measures, unspecified: Secondary | ICD-10-CM | POA: Diagnosis not present

## 2024-02-12 DIAGNOSIS — Z0000 Encounter for general adult medical examination without abnormal findings: Secondary | ICD-10-CM | POA: Diagnosis not present

## 2024-02-12 DIAGNOSIS — I714 Abdominal aortic aneurysm, without rupture, unspecified: Secondary | ICD-10-CM | POA: Diagnosis not present

## 2024-02-12 DIAGNOSIS — Z1389 Encounter for screening for other disorder: Secondary | ICD-10-CM | POA: Diagnosis not present

## 2024-02-12 DIAGNOSIS — Z7189 Other specified counseling: Secondary | ICD-10-CM | POA: Diagnosis not present

## 2024-03-02 DIAGNOSIS — R6889 Other general symptoms and signs: Secondary | ICD-10-CM | POA: Diagnosis not present

## 2024-03-02 DIAGNOSIS — R918 Other nonspecific abnormal finding of lung field: Secondary | ICD-10-CM | POA: Diagnosis not present

## 2024-03-02 DIAGNOSIS — J439 Emphysema, unspecified: Secondary | ICD-10-CM | POA: Diagnosis not present

## 2024-03-02 DIAGNOSIS — R471 Dysarthria and anarthria: Secondary | ICD-10-CM | POA: Diagnosis not present

## 2024-03-02 DIAGNOSIS — Z79899 Other long term (current) drug therapy: Secondary | ICD-10-CM | POA: Diagnosis not present

## 2024-03-02 DIAGNOSIS — J9611 Chronic respiratory failure with hypoxia: Secondary | ICD-10-CM | POA: Diagnosis not present

## 2024-03-02 DIAGNOSIS — Z7982 Long term (current) use of aspirin: Secondary | ICD-10-CM | POA: Diagnosis not present

## 2024-03-02 DIAGNOSIS — Z885 Allergy status to narcotic agent status: Secondary | ICD-10-CM | POA: Diagnosis not present

## 2024-03-02 DIAGNOSIS — I5032 Chronic diastolic (congestive) heart failure: Secondary | ICD-10-CM | POA: Diagnosis not present

## 2024-03-02 DIAGNOSIS — J61 Pneumoconiosis due to asbestos and other mineral fibers: Secondary | ICD-10-CM | POA: Diagnosis not present

## 2024-03-02 DIAGNOSIS — I447 Left bundle-branch block, unspecified: Secondary | ICD-10-CM | POA: Diagnosis not present

## 2024-03-02 DIAGNOSIS — R519 Headache, unspecified: Secondary | ICD-10-CM | POA: Diagnosis not present

## 2024-03-02 DIAGNOSIS — I6623 Occlusion and stenosis of bilateral posterior cerebral arteries: Secondary | ICD-10-CM | POA: Diagnosis not present

## 2024-03-02 DIAGNOSIS — Z743 Need for continuous supervision: Secondary | ICD-10-CM | POA: Diagnosis not present

## 2024-03-02 DIAGNOSIS — R4781 Slurred speech: Secondary | ICD-10-CM | POA: Diagnosis not present

## 2024-03-02 DIAGNOSIS — Z8673 Personal history of transient ischemic attack (TIA), and cerebral infarction without residual deficits: Secondary | ICD-10-CM | POA: Diagnosis not present

## 2024-03-02 DIAGNOSIS — M6281 Muscle weakness (generalized): Secondary | ICD-10-CM | POA: Diagnosis not present

## 2024-03-02 DIAGNOSIS — R2681 Unsteadiness on feet: Secondary | ICD-10-CM | POA: Diagnosis not present

## 2024-03-02 DIAGNOSIS — G4489 Other headache syndrome: Secondary | ICD-10-CM | POA: Diagnosis not present

## 2024-03-02 DIAGNOSIS — J929 Pleural plaque without asbestos: Secondary | ICD-10-CM | POA: Diagnosis not present

## 2024-03-02 DIAGNOSIS — Z66 Do not resuscitate: Secondary | ICD-10-CM | POA: Diagnosis not present

## 2024-03-02 DIAGNOSIS — R110 Nausea: Secondary | ICD-10-CM | POA: Diagnosis not present

## 2024-03-02 DIAGNOSIS — E785 Hyperlipidemia, unspecified: Secondary | ICD-10-CM | POA: Diagnosis not present

## 2024-03-02 DIAGNOSIS — I110 Hypertensive heart disease with heart failure: Secondary | ICD-10-CM | POA: Diagnosis not present

## 2024-03-02 DIAGNOSIS — R29818 Other symptoms and signs involving the nervous system: Secondary | ICD-10-CM | POA: Diagnosis not present

## 2024-03-02 DIAGNOSIS — I639 Cerebral infarction, unspecified: Secondary | ICD-10-CM | POA: Diagnosis not present

## 2024-03-02 DIAGNOSIS — J449 Chronic obstructive pulmonary disease, unspecified: Secondary | ICD-10-CM | POA: Diagnosis not present

## 2024-03-02 DIAGNOSIS — Z7902 Long term (current) use of antithrombotics/antiplatelets: Secondary | ICD-10-CM | POA: Diagnosis not present

## 2024-03-02 DIAGNOSIS — Z87891 Personal history of nicotine dependence: Secondary | ICD-10-CM | POA: Diagnosis not present

## 2024-03-02 DIAGNOSIS — R4701 Aphasia: Secondary | ICD-10-CM | POA: Diagnosis not present

## 2024-03-02 DIAGNOSIS — I6523 Occlusion and stenosis of bilateral carotid arteries: Secondary | ICD-10-CM | POA: Diagnosis not present

## 2024-03-03 DIAGNOSIS — J449 Chronic obstructive pulmonary disease, unspecified: Secondary | ICD-10-CM | POA: Diagnosis not present

## 2024-03-03 DIAGNOSIS — R471 Dysarthria and anarthria: Secondary | ICD-10-CM | POA: Diagnosis not present

## 2024-03-03 DIAGNOSIS — J439 Emphysema, unspecified: Secondary | ICD-10-CM | POA: Diagnosis not present

## 2024-03-03 DIAGNOSIS — R9082 White matter disease, unspecified: Secondary | ICD-10-CM | POA: Diagnosis not present

## 2024-03-03 DIAGNOSIS — Z79899 Other long term (current) drug therapy: Secondary | ICD-10-CM | POA: Diagnosis not present

## 2024-03-03 DIAGNOSIS — I10 Essential (primary) hypertension: Secondary | ICD-10-CM | POA: Diagnosis not present

## 2024-03-03 DIAGNOSIS — J929 Pleural plaque without asbestos: Secondary | ICD-10-CM | POA: Diagnosis not present

## 2024-03-03 DIAGNOSIS — E785 Hyperlipidemia, unspecified: Secondary | ICD-10-CM | POA: Diagnosis not present

## 2024-03-03 DIAGNOSIS — R9089 Other abnormal findings on diagnostic imaging of central nervous system: Secondary | ICD-10-CM | POA: Diagnosis not present

## 2024-03-03 DIAGNOSIS — Z8673 Personal history of transient ischemic attack (TIA), and cerebral infarction without residual deficits: Secondary | ICD-10-CM | POA: Diagnosis not present

## 2024-03-03 DIAGNOSIS — R918 Other nonspecific abnormal finding of lung field: Secondary | ICD-10-CM | POA: Diagnosis not present

## 2024-03-04 ENCOUNTER — Inpatient Hospital Stay: Admission: RE | Admit: 2024-03-04 | Source: Ambulatory Visit

## 2024-03-07 DIAGNOSIS — J61 Pneumoconiosis due to asbestos and other mineral fibers: Secondary | ICD-10-CM | POA: Diagnosis not present

## 2024-03-07 DIAGNOSIS — E785 Hyperlipidemia, unspecified: Secondary | ICD-10-CM | POA: Diagnosis not present

## 2024-03-07 DIAGNOSIS — J984 Other disorders of lung: Secondary | ICD-10-CM | POA: Diagnosis not present

## 2024-03-07 DIAGNOSIS — Z79899 Other long term (current) drug therapy: Secondary | ICD-10-CM | POA: Diagnosis not present

## 2024-03-07 DIAGNOSIS — Z556 Problems related to health literacy: Secondary | ICD-10-CM | POA: Diagnosis not present

## 2024-03-07 DIAGNOSIS — R069 Unspecified abnormalities of breathing: Secondary | ICD-10-CM | POA: Diagnosis not present

## 2024-03-07 DIAGNOSIS — R079 Chest pain, unspecified: Secondary | ICD-10-CM | POA: Diagnosis not present

## 2024-03-07 DIAGNOSIS — R531 Weakness: Secondary | ICD-10-CM | POA: Diagnosis not present

## 2024-03-07 DIAGNOSIS — I69320 Aphasia following cerebral infarction: Secondary | ICD-10-CM | POA: Diagnosis not present

## 2024-03-07 DIAGNOSIS — J449 Chronic obstructive pulmonary disease, unspecified: Secondary | ICD-10-CM | POA: Diagnosis not present

## 2024-03-07 DIAGNOSIS — R918 Other nonspecific abnormal finding of lung field: Secondary | ICD-10-CM | POA: Diagnosis not present

## 2024-03-07 DIAGNOSIS — I69398 Other sequelae of cerebral infarction: Secondary | ICD-10-CM | POA: Diagnosis not present

## 2024-03-07 DIAGNOSIS — R55 Syncope and collapse: Secondary | ICD-10-CM | POA: Diagnosis not present

## 2024-03-07 DIAGNOSIS — Z8673 Personal history of transient ischemic attack (TIA), and cerebral infarction without residual deficits: Secondary | ICD-10-CM | POA: Diagnosis not present

## 2024-03-07 DIAGNOSIS — Z7902 Long term (current) use of antithrombotics/antiplatelets: Secondary | ICD-10-CM | POA: Diagnosis not present

## 2024-03-07 DIAGNOSIS — R0689 Other abnormalities of breathing: Secondary | ICD-10-CM | POA: Diagnosis not present

## 2024-03-07 DIAGNOSIS — I499 Cardiac arrhythmia, unspecified: Secondary | ICD-10-CM | POA: Diagnosis not present

## 2024-03-07 DIAGNOSIS — I5030 Unspecified diastolic (congestive) heart failure: Secondary | ICD-10-CM | POA: Diagnosis not present

## 2024-03-07 DIAGNOSIS — K449 Diaphragmatic hernia without obstruction or gangrene: Secondary | ICD-10-CM | POA: Diagnosis not present

## 2024-03-07 DIAGNOSIS — I5032 Chronic diastolic (congestive) heart failure: Secondary | ICD-10-CM | POA: Diagnosis not present

## 2024-03-07 DIAGNOSIS — Z9981 Dependence on supplemental oxygen: Secondary | ICD-10-CM | POA: Diagnosis not present

## 2024-03-07 DIAGNOSIS — Z87891 Personal history of nicotine dependence: Secondary | ICD-10-CM | POA: Diagnosis not present

## 2024-03-07 DIAGNOSIS — R0602 Shortness of breath: Secondary | ICD-10-CM | POA: Diagnosis not present

## 2024-03-07 DIAGNOSIS — J441 Chronic obstructive pulmonary disease with (acute) exacerbation: Secondary | ICD-10-CM | POA: Diagnosis not present

## 2024-03-07 DIAGNOSIS — J439 Emphysema, unspecified: Secondary | ICD-10-CM | POA: Diagnosis not present

## 2024-03-07 DIAGNOSIS — Z7951 Long term (current) use of inhaled steroids: Secondary | ICD-10-CM | POA: Diagnosis not present

## 2024-03-07 DIAGNOSIS — I110 Hypertensive heart disease with heart failure: Secondary | ICD-10-CM | POA: Diagnosis not present

## 2024-03-07 DIAGNOSIS — J9611 Chronic respiratory failure with hypoxia: Secondary | ICD-10-CM | POA: Diagnosis not present

## 2024-03-07 DIAGNOSIS — K635 Polyp of colon: Secondary | ICD-10-CM | POA: Diagnosis not present

## 2024-03-09 DIAGNOSIS — I69398 Other sequelae of cerebral infarction: Secondary | ICD-10-CM | POA: Diagnosis not present

## 2024-03-09 DIAGNOSIS — R531 Weakness: Secondary | ICD-10-CM | POA: Diagnosis not present

## 2024-03-09 DIAGNOSIS — J61 Pneumoconiosis due to asbestos and other mineral fibers: Secondary | ICD-10-CM | POA: Diagnosis not present

## 2024-03-09 DIAGNOSIS — Z7902 Long term (current) use of antithrombotics/antiplatelets: Secondary | ICD-10-CM | POA: Diagnosis not present

## 2024-03-09 DIAGNOSIS — I5032 Chronic diastolic (congestive) heart failure: Secondary | ICD-10-CM | POA: Diagnosis not present

## 2024-03-09 DIAGNOSIS — I69320 Aphasia following cerebral infarction: Secondary | ICD-10-CM | POA: Diagnosis not present

## 2024-03-09 DIAGNOSIS — J449 Chronic obstructive pulmonary disease, unspecified: Secondary | ICD-10-CM | POA: Diagnosis not present

## 2024-03-09 DIAGNOSIS — E785 Hyperlipidemia, unspecified: Secondary | ICD-10-CM | POA: Diagnosis not present

## 2024-03-09 DIAGNOSIS — I110 Hypertensive heart disease with heart failure: Secondary | ICD-10-CM | POA: Diagnosis not present

## 2024-03-09 DIAGNOSIS — K635 Polyp of colon: Secondary | ICD-10-CM | POA: Diagnosis not present

## 2024-03-09 DIAGNOSIS — Z9981 Dependence on supplemental oxygen: Secondary | ICD-10-CM | POA: Diagnosis not present

## 2024-03-09 DIAGNOSIS — J9611 Chronic respiratory failure with hypoxia: Secondary | ICD-10-CM | POA: Diagnosis not present

## 2024-03-09 DIAGNOSIS — Z7951 Long term (current) use of inhaled steroids: Secondary | ICD-10-CM | POA: Diagnosis not present

## 2024-03-09 DIAGNOSIS — Z556 Problems related to health literacy: Secondary | ICD-10-CM | POA: Diagnosis not present

## 2024-03-11 DIAGNOSIS — I10 Essential (primary) hypertension: Secondary | ICD-10-CM | POA: Diagnosis not present

## 2024-03-11 DIAGNOSIS — Z09 Encounter for follow-up examination after completed treatment for conditions other than malignant neoplasm: Secondary | ICD-10-CM | POA: Diagnosis not present

## 2024-03-11 DIAGNOSIS — J440 Chronic obstructive pulmonary disease with acute lower respiratory infection: Secondary | ICD-10-CM | POA: Diagnosis not present

## 2024-03-11 DIAGNOSIS — Z299 Encounter for prophylactic measures, unspecified: Secondary | ICD-10-CM | POA: Diagnosis not present

## 2024-03-18 DIAGNOSIS — I69320 Aphasia following cerebral infarction: Secondary | ICD-10-CM | POA: Diagnosis not present

## 2024-03-18 DIAGNOSIS — I110 Hypertensive heart disease with heart failure: Secondary | ICD-10-CM | POA: Diagnosis not present

## 2024-03-18 DIAGNOSIS — I5032 Chronic diastolic (congestive) heart failure: Secondary | ICD-10-CM | POA: Diagnosis not present

## 2024-03-18 DIAGNOSIS — Z9981 Dependence on supplemental oxygen: Secondary | ICD-10-CM | POA: Diagnosis not present

## 2024-03-18 DIAGNOSIS — J9611 Chronic respiratory failure with hypoxia: Secondary | ICD-10-CM | POA: Diagnosis not present

## 2024-03-18 DIAGNOSIS — E785 Hyperlipidemia, unspecified: Secondary | ICD-10-CM | POA: Diagnosis not present

## 2024-03-18 DIAGNOSIS — K635 Polyp of colon: Secondary | ICD-10-CM | POA: Diagnosis not present

## 2024-03-18 DIAGNOSIS — I69398 Other sequelae of cerebral infarction: Secondary | ICD-10-CM | POA: Diagnosis not present

## 2024-03-18 DIAGNOSIS — J61 Pneumoconiosis due to asbestos and other mineral fibers: Secondary | ICD-10-CM | POA: Diagnosis not present

## 2024-03-18 DIAGNOSIS — Z556 Problems related to health literacy: Secondary | ICD-10-CM | POA: Diagnosis not present

## 2024-03-18 DIAGNOSIS — Z7951 Long term (current) use of inhaled steroids: Secondary | ICD-10-CM | POA: Diagnosis not present

## 2024-03-18 DIAGNOSIS — J449 Chronic obstructive pulmonary disease, unspecified: Secondary | ICD-10-CM | POA: Diagnosis not present

## 2024-03-18 DIAGNOSIS — Z7902 Long term (current) use of antithrombotics/antiplatelets: Secondary | ICD-10-CM | POA: Diagnosis not present

## 2024-03-18 DIAGNOSIS — R531 Weakness: Secondary | ICD-10-CM | POA: Diagnosis not present

## 2024-03-19 DIAGNOSIS — R531 Weakness: Secondary | ICD-10-CM | POA: Diagnosis not present

## 2024-03-19 DIAGNOSIS — I69398 Other sequelae of cerebral infarction: Secondary | ICD-10-CM | POA: Diagnosis not present

## 2024-03-19 DIAGNOSIS — E785 Hyperlipidemia, unspecified: Secondary | ICD-10-CM | POA: Diagnosis not present

## 2024-03-19 DIAGNOSIS — I69320 Aphasia following cerebral infarction: Secondary | ICD-10-CM | POA: Diagnosis not present

## 2024-03-19 DIAGNOSIS — J9611 Chronic respiratory failure with hypoxia: Secondary | ICD-10-CM | POA: Diagnosis not present

## 2024-03-19 DIAGNOSIS — Z9981 Dependence on supplemental oxygen: Secondary | ICD-10-CM | POA: Diagnosis not present

## 2024-03-19 DIAGNOSIS — J61 Pneumoconiosis due to asbestos and other mineral fibers: Secondary | ICD-10-CM | POA: Diagnosis not present

## 2024-03-19 DIAGNOSIS — Z7902 Long term (current) use of antithrombotics/antiplatelets: Secondary | ICD-10-CM | POA: Diagnosis not present

## 2024-03-19 DIAGNOSIS — I5032 Chronic diastolic (congestive) heart failure: Secondary | ICD-10-CM | POA: Diagnosis not present

## 2024-03-19 DIAGNOSIS — Z556 Problems related to health literacy: Secondary | ICD-10-CM | POA: Diagnosis not present

## 2024-03-19 DIAGNOSIS — Z7951 Long term (current) use of inhaled steroids: Secondary | ICD-10-CM | POA: Diagnosis not present

## 2024-03-19 DIAGNOSIS — I110 Hypertensive heart disease with heart failure: Secondary | ICD-10-CM | POA: Diagnosis not present

## 2024-03-19 DIAGNOSIS — J449 Chronic obstructive pulmonary disease, unspecified: Secondary | ICD-10-CM | POA: Diagnosis not present

## 2024-03-19 DIAGNOSIS — K635 Polyp of colon: Secondary | ICD-10-CM | POA: Diagnosis not present

## 2024-03-21 DIAGNOSIS — Z79899 Other long term (current) drug therapy: Secondary | ICD-10-CM | POA: Diagnosis not present

## 2024-03-21 DIAGNOSIS — J441 Chronic obstructive pulmonary disease with (acute) exacerbation: Secondary | ICD-10-CM | POA: Diagnosis not present

## 2024-03-21 DIAGNOSIS — Z66 Do not resuscitate: Secondary | ICD-10-CM | POA: Diagnosis not present

## 2024-03-21 DIAGNOSIS — I480 Paroxysmal atrial fibrillation: Secondary | ICD-10-CM | POA: Diagnosis not present

## 2024-03-21 DIAGNOSIS — I5030 Unspecified diastolic (congestive) heart failure: Secondary | ICD-10-CM | POA: Diagnosis not present

## 2024-03-21 DIAGNOSIS — J440 Chronic obstructive pulmonary disease with acute lower respiratory infection: Secondary | ICD-10-CM | POA: Diagnosis not present

## 2024-03-21 DIAGNOSIS — Z87891 Personal history of nicotine dependence: Secondary | ICD-10-CM | POA: Diagnosis not present

## 2024-03-21 DIAGNOSIS — Z7951 Long term (current) use of inhaled steroids: Secondary | ICD-10-CM | POA: Diagnosis not present

## 2024-03-21 DIAGNOSIS — I110 Hypertensive heart disease with heart failure: Secondary | ICD-10-CM | POA: Diagnosis not present

## 2024-03-21 DIAGNOSIS — R0602 Shortness of breath: Secondary | ICD-10-CM | POA: Diagnosis not present

## 2024-03-21 DIAGNOSIS — Z7901 Long term (current) use of anticoagulants: Secondary | ICD-10-CM | POA: Diagnosis not present

## 2024-03-21 DIAGNOSIS — E785 Hyperlipidemia, unspecified: Secondary | ICD-10-CM | POA: Diagnosis not present

## 2024-03-21 DIAGNOSIS — J9621 Acute and chronic respiratory failure with hypoxia: Secondary | ICD-10-CM | POA: Diagnosis not present

## 2024-03-21 DIAGNOSIS — Z885 Allergy status to narcotic agent status: Secondary | ICD-10-CM | POA: Diagnosis not present

## 2024-03-21 DIAGNOSIS — G459 Transient cerebral ischemic attack, unspecified: Secondary | ICD-10-CM | POA: Diagnosis not present

## 2024-03-21 DIAGNOSIS — J929 Pleural plaque without asbestos: Secondary | ICD-10-CM | POA: Diagnosis not present

## 2024-03-21 DIAGNOSIS — I639 Cerebral infarction, unspecified: Secondary | ICD-10-CM | POA: Diagnosis not present

## 2024-03-21 DIAGNOSIS — R918 Other nonspecific abnormal finding of lung field: Secondary | ICD-10-CM | POA: Diagnosis not present

## 2024-03-21 DIAGNOSIS — R0603 Acute respiratory distress: Secondary | ICD-10-CM | POA: Diagnosis not present

## 2024-03-21 DIAGNOSIS — J9622 Acute and chronic respiratory failure with hypercapnia: Secondary | ICD-10-CM | POA: Diagnosis not present

## 2024-03-21 DIAGNOSIS — J449 Chronic obstructive pulmonary disease, unspecified: Secondary | ICD-10-CM | POA: Diagnosis not present

## 2024-03-21 DIAGNOSIS — I5032 Chronic diastolic (congestive) heart failure: Secondary | ICD-10-CM | POA: Diagnosis not present

## 2024-03-21 DIAGNOSIS — J22 Unspecified acute lower respiratory infection: Secondary | ICD-10-CM | POA: Diagnosis not present

## 2024-03-21 DIAGNOSIS — Z8673 Personal history of transient ischemic attack (TIA), and cerebral infarction without residual deficits: Secondary | ICD-10-CM | POA: Diagnosis not present

## 2024-03-21 DIAGNOSIS — R0902 Hypoxemia: Secondary | ICD-10-CM | POA: Diagnosis not present

## 2024-03-21 DIAGNOSIS — I499 Cardiac arrhythmia, unspecified: Secondary | ICD-10-CM | POA: Diagnosis not present

## 2024-03-21 DIAGNOSIS — Z792 Long term (current) use of antibiotics: Secondary | ICD-10-CM | POA: Diagnosis not present

## 2024-03-21 DIAGNOSIS — D696 Thrombocytopenia, unspecified: Secondary | ICD-10-CM | POA: Diagnosis not present

## 2024-03-21 DIAGNOSIS — R0600 Dyspnea, unspecified: Secondary | ICD-10-CM | POA: Diagnosis not present

## 2024-03-31 ENCOUNTER — Encounter: Payer: Self-pay | Admitting: Neurology

## 2024-03-31 ENCOUNTER — Ambulatory Visit: Admitting: Neurology

## 2024-03-31 VITALS — BP 117/62 | HR 96 | Ht 68.0 in

## 2024-03-31 DIAGNOSIS — R531 Weakness: Secondary | ICD-10-CM | POA: Diagnosis not present

## 2024-03-31 DIAGNOSIS — Z556 Problems related to health literacy: Secondary | ICD-10-CM | POA: Diagnosis not present

## 2024-03-31 DIAGNOSIS — Z9981 Dependence on supplemental oxygen: Secondary | ICD-10-CM | POA: Diagnosis not present

## 2024-03-31 DIAGNOSIS — I69398 Other sequelae of cerebral infarction: Secondary | ICD-10-CM | POA: Diagnosis not present

## 2024-03-31 DIAGNOSIS — I110 Hypertensive heart disease with heart failure: Secondary | ICD-10-CM | POA: Diagnosis not present

## 2024-03-31 DIAGNOSIS — R4781 Slurred speech: Secondary | ICD-10-CM | POA: Diagnosis not present

## 2024-03-31 DIAGNOSIS — Z7951 Long term (current) use of inhaled steroids: Secondary | ICD-10-CM | POA: Diagnosis not present

## 2024-03-31 DIAGNOSIS — E785 Hyperlipidemia, unspecified: Secondary | ICD-10-CM | POA: Diagnosis not present

## 2024-03-31 DIAGNOSIS — G459 Transient cerebral ischemic attack, unspecified: Secondary | ICD-10-CM

## 2024-03-31 DIAGNOSIS — J9611 Chronic respiratory failure with hypoxia: Secondary | ICD-10-CM | POA: Diagnosis not present

## 2024-03-31 DIAGNOSIS — J61 Pneumoconiosis due to asbestos and other mineral fibers: Secondary | ICD-10-CM | POA: Diagnosis not present

## 2024-03-31 DIAGNOSIS — I69320 Aphasia following cerebral infarction: Secondary | ICD-10-CM | POA: Diagnosis not present

## 2024-03-31 DIAGNOSIS — J449 Chronic obstructive pulmonary disease, unspecified: Secondary | ICD-10-CM | POA: Diagnosis not present

## 2024-03-31 DIAGNOSIS — I5032 Chronic diastolic (congestive) heart failure: Secondary | ICD-10-CM | POA: Diagnosis not present

## 2024-03-31 DIAGNOSIS — K635 Polyp of colon: Secondary | ICD-10-CM | POA: Diagnosis not present

## 2024-03-31 DIAGNOSIS — Z7902 Long term (current) use of antithrombotics/antiplatelets: Secondary | ICD-10-CM | POA: Diagnosis not present

## 2024-03-31 NOTE — Patient Instructions (Addendum)
 Continue current medication including Plavix  Referral to speech therapy  Due to recent TIA, patient is not cleared for hernia surgery until 40-month following TIA Continue to follow-up with PCP Return if symptoms do get worse.

## 2024-03-31 NOTE — Progress Notes (Signed)
 [facility]  PATIENT: Timothy [name] DOB: 23-Oct-1946  REQUESTING CLINICIAN: Orlena Bitters, MD HISTORY FROM: Patient and family  REASON FOR VISIT: Dysarthria    HISTORICAL  CHIEF COMPLAINT:  Chief Complaint  Patient presents with   New Patient (Initial Visit)    Rm 12. HOSPITAL FU - NX Timothy [name] LV 2025/Paper/[facility] hospital referral for acute CVA. Pt reports he cannot talk well since stroke, but is not in pain.     HISTORY OF PRESENT ILLNESS:  This is a 78 year old gentleman with past medical history including COPD, on supplemental oxygen , hypertension, hyperlipidemia, heart failure, previous stroke who is presenting after being admitted to the hospital for dysarthria.  Patient reported presented to hospital due to home slurred speech, had a full workup including MRI brain which was negative for any acute stroke.  He was continued on his home meds including Plavix .  He tells me that his speech is back to normal he still slurred speech and sometimes having word finding difficulty.  He has not done any speech therapy.  OTHER MEDICAL CONDITIONS: COPD on of supplemental oxygen , hypertension, hyperlipidemia, heart failure, inguinal hernia   REVIEW OF SYSTEMS: Full 14 system review of systems performed and negative with exception of: As noted in the HPI   ALLERGIES: Allergies  Allergen Reactions   Codeine Itching    HOME MEDICATIONS: Outpatient Medications Prior to Visit  Medication Sig Dispense Refill   albuterol  (VENTOLIN  HFA) 108 (90 Base) MCG/ACT inhaler Inhale 2 puffs into the lungs every 6 (six) hours as needed for wheezing or shortness of breath. 18 g 3   amLODipine (NORVASC) 5 MG tablet Take 5 mg by mouth daily.     atorvastatin  (LIPITOR) 40 MG tablet Take 1 tablet (40 mg total) by mouth daily. 90 tablet 2   budeson-glycopyrrolate-formoterol (BREZTRI  AEROSPHERE) 160-9-4.8 MCG/ACT AERO Inhale 2 puffs into the lungs in the morning and at bedtime. 10.7 g 11    citalopram (CELEXA) 20 MG tablet Take 20 mg by mouth at bedtime.     clopidogrel  (PLAVIX ) 75 MG tablet Take 75 mg by mouth daily.     ipratropium-albuterol  (DUONEB) 0.5-2.5 (3) MG/3ML SOLN Take 3 mLs by nebulization every 6 (six) hours as needed. 360 mL 5   lisinopril (ZESTRIL) 10 MG tablet Take 10 mg by mouth daily.     OXYGEN  2 LPM     No facility-administered medications prior to visit.    PAST MEDICAL HISTORY: Past Medical History:  Diagnosis Date   COPD (chronic obstructive pulmonary disease) (HCC)    Essential hypertension    History of asbestosis 2010   History of cardiac catheterization 2012   Reportedly normal coronaries   Respiratory failure with hypoxia (HCC)    Home oxygen     PAST SURGICAL HISTORY: Past Surgical History:  Procedure Laterality Date   CARDIAC CATHETERIZATION     x 2   CHOLECYSTECTOMY     2015   CYSTOSCOPY W/ RETROGRADES Bilateral 12/17/2017   Procedure: CYSTOSCOPY WITH RETROGRADE PYELOGRAM;  Surgeon: Timothy Homans, MD;  Location: [facility];  Service: Urology;  Laterality: Bilateral;   HEMORRHOID SURGERY     HERNIA REPAIR  2008   Abdominal    TRANSURETHRAL RESECTION OF BLADDER TUMOR N/A 12/17/2017   Procedure: TRANSURETHRAL RESECTION OF BLADDER TUMOR (TURBT);  Surgeon: Timothy Homans, MD;  Location: [facility];  Service: Urology;  Laterality: N/A;   TRANSURETHRAL RESECTION OF BLADDER TUMOR N/A 02/11/2018   Procedure: TRANSURETHRAL RESECTION OF BLADDER  TUMOR (TURBT) WITH INSTILLATION OF MITOMYCIN ;  Surgeon: Timothy Homans, MD;  Location: [facility];  Service: Urology;  Laterality: N/A;  ONLY NEEDS 30 MIN   VASECTOMY  1998    FAMILY HISTORY: Family History  Problem Relation Age of Onset   Lung disease Mother        Asbestosis.   Pancreatic cancer Father    Breast cancer Sister     SOCIAL HISTORY: Social History   Socioeconomic History   Marital status: Legally Separated    Spouse name: Not on file   Number of children:  Not on file   Years of education: Not on file   Highest education level: Not on file  Occupational History   Not on file  Tobacco Use   Smoking status: Former    Current packs/day: 0.00    Average packs/day: 1 pack/day for 42.0 years (42.0 ttl pk-yrs)    Types: Cigarettes    Start date: 44    Quit date: 2004    Years since quitting: 21.4   Smokeless tobacco: Never  Vaping Use   Vaping status: Never Used  Substance and Sexual Activity   Alcohol use: Yes    Alcohol/week: 1.0 standard drink of alcohol    Types: 1 Cans of beer per week    Comment: occas   Drug use: No   Sexual activity: Not on file  Other Topics Concern   Not on file  Social History Narrative   Not on file   Social Drivers of Health   Financial Resource Strain: Low Risk  (03/22/2024)   Received [facility]   Overall Financial Resource Strain (CARDIA)    Difficulty of Paying Living Expenses: Not hard at all  Food Insecurity: No Food Insecurity (03/22/2024)   Received from [facility]   Hunger Vital Sign    Worried About Running Out of Food in the Last Year: Never true    Ran Out of Food in the Last Year: Never true  Transportation Needs: No Transportation Needs (03/22/2024)   [facility] - Transportation    Lack of Transportation (Medical): No    Lack of Transportation (Non-Medical): No  Physical Activity: Insufficiently Active (08/21/2023)   Received from [facility]   Exercise Vital Sign    Days of Exercise per Week: 2 days    Minutes of Exercise per Session: 10 min  Stress: No Stress Concern Present (08/21/2023)   Received from [facility] - Occupational Stress Questionnaire    Feeling of Stress : Not at all  Social Connections: Moderately Isolated (08/21/2023)   Received from [facility]   Social Connection and Isolation Panel [NHANES]    Frequency of Communication with Friends and Family: More than  three times a week    Frequency of Social Gatherings with Friends and Family: Once a week    Attends Religious Services: Never    Database administrator or Organizations: No    Attends Banker Meetings: Never    Marital Status: Married  Catering manager Violence: Not At Risk (03/22/2024)   Received from [facility]   Humiliation, Afraid, Rape, and Kick questionnaire    Fear of Current or Ex-Partner: No    Emotionally Abused: No    Physically Abused: No    Sexually Abused: No    PHYSICAL EXAM  GENERAL EXAM/CONSTITUTIONAL: Vitals:  Vitals:  03/31/24 1106  BP: 117/62  Pulse: 96  Height: 5\' 8"  (1.727 m)   Body mass index is 21.74 kg/m. Wt Readings from Last 3 Encounters:  01/29/24 143 lb (64.9 kg)  01/13/24 143 lb 4.8 oz (65 kg)  04/25/22 155 lb 4.8 oz (70.4 kg)   Patient is in no distress; well developed, nourished and groomed; neck is supple  MUSCULOSKELETAL: Gait, strength, tone, movements noted in Neurologic exam below  NEUROLOGIC: MENTAL STATUS:      No data to display         awake, alert, oriented to person, place and time recent and remote memory intact normal attention and concentration language fluent, comprehension intact, naming intact, difficulty with repetition  fund of knowledge appropriate  CRANIAL NERVE:  2nd, 3rd, 4th, 6th - Visual fields full to confrontation, extraocular muscles intact, no nystagmus 5th - facial sensation symmetric 7th - facial strength symmetric 8th - hearing intact 9th - palate elevates symmetrically, uvula midline 11th - shoulder shrug symmetric 12th - tongue protrusion midline  MOTOR:  normal bulk and tone, full strength in the BUE, BLE  SENSORY:  normal and symmetric to light touch  COORDINATION:  finger-nose-finger, fine finger movements normal  GAIT/STATION:  normal   DIAGNOSTIC DATA (LABS, IMAGING, TESTING) - I reviewed patient records, labs, notes, testing and imaging myself where  available.  Lab Results  Component Value Date   WBC 5.7 04/26/2022   HGB 12.7 (L) 04/26/2022   HCT 39.7 04/26/2022   MCV 95.7 04/26/2022   PLT 160 04/26/2022      Component Value Date/Time   NA 140 01/29/2024 1448   K 4.9 01/29/2024 1448   CL 104 01/29/2024 1448   CO2 23 01/29/2024 1448   GLUCOSE 97 01/29/2024 1448   GLUCOSE 98 04/26/2022 0601   BUN 18 01/29/2024 1448   CREATININE 0.97 01/29/2024 1448   CALCIUM  9.1 01/29/2024 1448   PROT 5.9 (L) 04/26/2022 0601   ALBUMIN 3.0 (L) 04/26/2022 0601   AST 13 (L) 04/26/2022 0601   ALT 9 04/26/2022 0601   ALKPHOS 47 04/26/2022 0601   BILITOT 0.6 04/26/2022 0601   GFRNONAA >60 04/26/2022 0601   GFRAA >60 02/06/2018 0357   Lab Results  Component Value Date   CHOL 140 01/29/2024   HDL 53 01/29/2024   LDLCALC 72 01/29/2024   TRIG 74 01/29/2024   CHOLHDL 2.6 01/29/2024   Lab Results  Component Value Date   HGBA1C 5.2 04/26/2022   No results found for: "VITAMINB12" No results found for: "TSH"  MRI Brain 03/03/2024 1. Negative for acute and subacute intracranial ischemic injury.  2. Moderate-severe white matter disease, likely ischemic and due to microvascular disease.  3. Multifocal old bihemispheric infarcts.   CTA Head and Neck 03/02/2024 1.    No intracranial large vessel occlusion.  2.    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  3.    Severe multifocal stenosis of the right PCA and moderate stenosis of the left PCA P2 and P3 segments.  4.    Moderate bilateral ACA A2 and A3 segment stenoses.  5.    Bilateral carotid bulb atherosclerosis resulting in less than 50% stenosis of the proximal ICAs.  6.    Severe bullous emphysema.     ASSESSMENT AND PLAN  78 y.o. year old male with history of COPD on supplemental oxygen , hypertension, hyperlipidemia, heart failure, inguinal hernia who is  presenting after being seen in the hospital for slurred  speech.  His MRI Brain was negative for any acute stroke.  Patient diagnosed with TIA, advised him to continue current medications including Plavix .  He is still having difficulty with repetition and word finding.  We will refer him to speech therapy. In terms of his inguinal hernia, since he is recently diagnosed with TIA, we will ask that surgery be postponed until 7-months after TIA.  I will advise him to continue following up with his doctors and I will see him as needed.   1. TIA (transient ischemic attack)   2. Slurred speech     Patient Instructions  Continue current medication including Plavix  Referral to speech therapy  Due to recent TIA, patient is not cleared for hernia surgery until 45-month following TIA Continue to follow-up with PCP Return if symptoms do get worse.  Orders Placed This Encounter  Procedures   Ambulatory referral to Speech Therapy    No orders of the defined types were placed in this encounter.   Return if symptoms worsen or fail to improve.    Cassandra Cleveland, MD 03/31/2024, 5:01 PM  [facility] [address], [address] [place], [place] [address] [phone]

## 2024-04-01 ENCOUNTER — Telehealth: Payer: Self-pay | Admitting: Neurology

## 2024-04-01 DIAGNOSIS — M179 Osteoarthritis of knee, unspecified: Secondary | ICD-10-CM | POA: Diagnosis not present

## 2024-04-01 DIAGNOSIS — J449 Chronic obstructive pulmonary disease, unspecified: Secondary | ICD-10-CM | POA: Diagnosis not present

## 2024-04-01 DIAGNOSIS — Z299 Encounter for prophylactic measures, unspecified: Secondary | ICD-10-CM | POA: Diagnosis not present

## 2024-04-01 DIAGNOSIS — Z09 Encounter for follow-up examination after completed treatment for conditions other than malignant neoplasm: Secondary | ICD-10-CM | POA: Diagnosis not present

## 2024-04-01 DIAGNOSIS — I10 Essential (primary) hypertension: Secondary | ICD-10-CM | POA: Diagnosis not present

## 2024-04-01 DIAGNOSIS — I4891 Unspecified atrial fibrillation: Secondary | ICD-10-CM | POA: Diagnosis not present

## 2024-04-01 DIAGNOSIS — J9611 Chronic respiratory failure with hypoxia: Secondary | ICD-10-CM | POA: Diagnosis not present

## 2024-04-01 NOTE — Telephone Encounter (Signed)
 Referral for speech therapy fax to [facility]. Phone: [phone], Fax: [phone]

## 2024-04-01 NOTE — Telephone Encounter (Signed)
 [facility] called to inform they do not have anyone available for the service pt needs, call sent to out going referral coordinator

## 2024-04-01 NOTE — Telephone Encounter (Signed)
 Patient called to notify [facility] do not have speech therapist on staff.  Refax to referral speech therapy to [facility]. Phone: [phone], Fax: [phone]

## 2024-04-02 DIAGNOSIS — I69320 Aphasia following cerebral infarction: Secondary | ICD-10-CM | POA: Diagnosis not present

## 2024-04-02 DIAGNOSIS — J61 Pneumoconiosis due to asbestos and other mineral fibers: Secondary | ICD-10-CM | POA: Diagnosis not present

## 2024-04-02 DIAGNOSIS — I110 Hypertensive heart disease with heart failure: Secondary | ICD-10-CM | POA: Diagnosis not present

## 2024-04-02 DIAGNOSIS — Z7902 Long term (current) use of antithrombotics/antiplatelets: Secondary | ICD-10-CM | POA: Diagnosis not present

## 2024-04-02 DIAGNOSIS — K635 Polyp of colon: Secondary | ICD-10-CM | POA: Diagnosis not present

## 2024-04-02 DIAGNOSIS — J449 Chronic obstructive pulmonary disease, unspecified: Secondary | ICD-10-CM | POA: Diagnosis not present

## 2024-04-02 DIAGNOSIS — Z556 Problems related to health literacy: Secondary | ICD-10-CM | POA: Diagnosis not present

## 2024-04-02 DIAGNOSIS — I5032 Chronic diastolic (congestive) heart failure: Secondary | ICD-10-CM | POA: Diagnosis not present

## 2024-04-02 DIAGNOSIS — Z7951 Long term (current) use of inhaled steroids: Secondary | ICD-10-CM | POA: Diagnosis not present

## 2024-04-02 DIAGNOSIS — E785 Hyperlipidemia, unspecified: Secondary | ICD-10-CM | POA: Diagnosis not present

## 2024-04-02 DIAGNOSIS — J9611 Chronic respiratory failure with hypoxia: Secondary | ICD-10-CM | POA: Diagnosis not present

## 2024-04-02 DIAGNOSIS — I69398 Other sequelae of cerebral infarction: Secondary | ICD-10-CM | POA: Diagnosis not present

## 2024-04-02 DIAGNOSIS — Z9981 Dependence on supplemental oxygen: Secondary | ICD-10-CM | POA: Diagnosis not present

## 2024-04-02 DIAGNOSIS — R531 Weakness: Secondary | ICD-10-CM | POA: Diagnosis not present

## 2024-04-06 ENCOUNTER — Ambulatory Visit (HOSPITAL_BASED_OUTPATIENT_CLINIC_OR_DEPARTMENT_OTHER)

## 2024-04-06 ENCOUNTER — Encounter (HOSPITAL_BASED_OUTPATIENT_CLINIC_OR_DEPARTMENT_OTHER): Payer: Self-pay | Admitting: Primary Care

## 2024-04-06 ENCOUNTER — Ambulatory Visit (HOSPITAL_BASED_OUTPATIENT_CLINIC_OR_DEPARTMENT_OTHER): Admitting: Primary Care

## 2024-04-06 ENCOUNTER — Telehealth: Payer: Self-pay | Admitting: Primary Care

## 2024-04-06 VITALS — BP 106/61 | HR 75 | Ht 68.0 in | Wt 138.0 lb

## 2024-04-06 DIAGNOSIS — Z87891 Personal history of nicotine dependence: Secondary | ICD-10-CM

## 2024-04-06 DIAGNOSIS — J449 Chronic obstructive pulmonary disease, unspecified: Secondary | ICD-10-CM

## 2024-04-06 DIAGNOSIS — J439 Emphysema, unspecified: Secondary | ICD-10-CM | POA: Diagnosis not present

## 2024-04-06 DIAGNOSIS — J9611 Chronic respiratory failure with hypoxia: Secondary | ICD-10-CM | POA: Diagnosis not present

## 2024-04-06 DIAGNOSIS — J189 Pneumonia, unspecified organism: Secondary | ICD-10-CM

## 2024-04-06 MED ORDER — OHTUVAYRE 3 MG/2.5ML IN SUSP: 3.0000 mg | Freq: Two times a day (BID) | RESPIRATORY_TRACT | Status: DC

## 2024-04-06 MED ORDER — IPRATROPIUM-ALBUTEROL 0.5-2.5 (3) MG/3ML IN SOLN: 3.0000 mL | Freq: Four times a day (QID) | RESPIRATORY_TRACT | 5 refills | Status: DC | PRN

## 2024-04-06 NOTE — Telephone Encounter (Signed)
 Filled out and printed. I have had this signed by Dr Gaynell Keeler. Routing to [facility] so the are aware and so they can receive the papers. I will leave this on my desk in [place]

## 2024-04-06 NOTE — Telephone Encounter (Signed)
 Can we fill out [facility] home pulmonary rehab form and see if Dr. Gaynell Keeler will sign. 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

## 2024-04-06 NOTE — Patient Instructions (Addendum)
 -  ACUTE COPD EXACERBATION: An acute COPD exacerbation is a sudden worsening of COPD symptoms. You were treated with antibiotics and prednisone , and we discussed adding Otovel nebulizer to help reduce inflammation and improve your activity tolerance. Continue using Breztri  Aerosphere, two puffs twice daily, and your ipratropium-albuterol  nebulizer as needed. If Ohtuvayre  is not affordable, Daliresp is an alternative. We also recommend home-based pulmonary rehabilitation and will schedule a spirometry test to assess your lung function.  -SEVERE EMPHYSEMA: Severe emphysema is a condition where the air sacs in the lungs are damaged, leading to breathing difficulties. Continue your current inhaler regimen with Breztri  Aerosphere two puffs morning and evening and start Ohtuvayre  nebulizer q 12 hours. Pulmonary rehabilitation is recommended to help improve your activity tolerance.  -CHRONIC RESPIRATORY FAILURE: Chronic respiratory failure is a long-term condition where the lungs cannot provide enough oxygen  to the body. Continue your oxygen  therapy at two liters per minute.  -RIGHT MIDDLE LOBE PNEUMONIA: Pneumonia is an infection that inflames the air sacs in one or both lungs. You were treated with IV antibiotics and are currently on Augmentin and doxycycline. Complete the course of these antibiotics as prescribed.  -PAROXYSMAL ATRIAL FIBRILLATION: Paroxysmal atrial fibrillation is an irregular and often rapid heart rate that can increase the risk of stroke. We discussed the importance of taking Eliquis to prevent stroke. We will contact your cardiologist and pharmacy team to address the cost issues with Eliquis and consider patient assistance programs or samples.  -HISTORY OF CEREBROVASCULAR ACCIDENT (CVA): A cerebrovascular accident, or stroke, occurs when the blood supply to part of your brain is interrupted. Given your high stroke risk due to atrial fibrillation, we will ensure you are on anticoagulation  therapy with Eliquis once the cost issues are resolved.  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 There have been no significant changes since your previous imaging.  INSTRUCTIONS: Please follow up with your cardiologist to address the cost issues with Eliquis. We will also schedule a spirometry test to assess your lung function. Continue with your current medications and complete the course of antibiotics as prescribed. Consider home-based pulmonary rehabilitation to help improve your activity tolerance.  Orders: Home pulmonary rehab Ohtuvayre  medication form   Follow-up 6 weeks with APP in [facility] or Beth in [place]/ CXR 2-3 days prior  Needs 30 min PFT first available

## 2024-04-06 NOTE — Telephone Encounter (Signed)
 Will await Ohtuvayre  paperwork and submit to [facility] once received  In regards to Eliquis - unless patient has cardiologist and can contact them, I am not sure they will provide samples to patients they do not manage  Patient assistance is through [employer] [place]. Pharmacy team does not manage patient assistance for this. Please ask CMA to work with patient to complete if he is interested and qualifies:  Requirements:  - annual household income is at or below $46,950 for a single person or $63,450 for a family of two.  - have spent at least 3% of yearly household income on out-of-pocket (OOP) prescription expenses in the year for which you are seeking assistance from [facility]   LINK: [email or web address]  Geraldene Kleine, PharmD, MPH, BCPS, CPP Clinical Pharmacist (Rheumatology and Pulmonology)

## 2024-04-06 NOTE — Telephone Encounter (Signed)
 I'm in [facility], we will email/scan to you   Jamie can you given patient assistance information to patient for Eliquis

## 2024-04-06 NOTE — Progress Notes (Signed)
 @Patient  ID: Timothy [name], male    DOB: 1946-05-23, 78 y.o.   MRN: [id number]  No chief complaint on file.   Referring provider: Orlena Bitters, MD  HPI: 78 year old male, former smoker. PMH significant for pulmonary emphysema, COPD, chronic respiratory failure, asbestos induced pleural plaque, CVA, CHF, HTN. Patient of Dr. Washington Hacker.   Hospitalization 5/25 [facility]  Day of admission HPI: Patient is a pleasant 78 year old male with history of COPD, HFpEF, on supplemental oxygen  at baseline of 2 L who presents to the ED via EMS for worsening shortness of breath over the past 1 day. Patient states that it began yesterday evening however worsened this morning and he was unable to catch his breath. Of note patient was previously admitted to our hospital under observation status on 03/02/2024 with symptoms of TIA. On current presentation in the ED patient also presents with A-fib RVR, this has converted to regular sinus rhythm after management of respiratory distress. Interestingly patient also had atrial fibrillation back in 2023, he was not started on anticoagulation at that time as it was his first episode and CHA2DS2-VASc score was 2. Patient has CHA2DS2-VASc score of around 6 currently. I have discussed with him starting Eliquis, continuing antibiotics for possible underlying pneumonia picked up on chest x-ray and right midlung, and further management of COPD exacerbation.   Patient admitted on Home O2? - yes Patient on home anticoagulant? - no Patient admitted with Chronic home foley catheter? - no Foley catheter placed or replaced by another service prior to admission? - no  Mental Status on Admission: The patient is Alert and oriented to PERSON The patient is Alert And oriented to TIME The patient is Alert and oriented to LOCATION  Problem List, Assessment & Plan   ASSESSMENT & PLAN (In order of descending acuity)  Respiratory distress secondary to probable right middle lobe  pneumonia and acute exacerbation of COPD - Treated with IV Rocephin and doxycycline - Will complete course of Augmentin and doxycycline at home - Continue incentive spirometer and Acapella device even while at home - Will need outpatient pulmonary follow-up - Originally on BiPAP, now back on 2 L O2 which is his baseline  TIA/history of CVA now with recurrent/paroxysmal atrial fibrillation - Patient started on Eliquis for paroxysmal A-fib - Needs outpatient follow-up with cardiology  HFpEF - Patient appears to have history of reduced ejection fraction however on most recent echocardiogram around 2 years ago his EF has recovered He is not currently on any diuretics - Will monitor fluid status closely, strict I&O's - Encouraged to keep outpatient cardiology follow-up   04/06/2024- Interim hx  Discussed the use of AI scribe software for clinical note transcription with the patient, who gave verbal consent to proceed.  History of Present Illness   Timothy [name] is a 78 year old male with COPD and atrial fibrillation who presents for follow-up after a recent hospital admission for respiratory distress. He is accompanied by his son.  He was admitted to [facility] on May 25th for respiratory distress, secondary to probable right middle lobe pneumonia and an acute COPD exacerbation. He initially presented to the emergency room via EMS for worsening shortness of breath over the past day. He was treated with IV antibiotics, including Rosadan and doxycycline, and was discharged on Augmentin and doxycycline, as well as a five-day course of prednisone . He was also provided with an incentive spirometer and an acapella device for home use.  He has a history  of atrial fibrillation, with a previous episode in 2023. During his recent hospital stay, he presented with atrial fibrillation with a rapid ventricular rate, which converted to sinus rhythm after management of his respiratory distress. He was started  on Eliquis for paroxysmal atrial fibrillation and a history of cerebrovascular accident (CVA), but he did not obtain the medication due to cost issues.  He feels 'pretty good' since discharge but is not back to his baseline breathing capacity. He is on two liters of oxygen . He experiences episodes of shortness of breath where his chest 'heats up,' and he uses a fan to cool down. He has a nebulizer and uses Duoneb (ipratropium/albuterol ) as needed, but he is currently out of his rescue inhaler.  His medication regimen includes Breztri  Aerosphere, two puffs twice daily and as needed, and two liters of chronic oxygen . He was also discharged on a five-day course of prednisone  40 mg. The EMS provided him with a different kind of albuterol  that improved his breathing significantly.  He has a history of emphysema and pleural plaques, with a CT scan in 2022 showing severe emphysema and bilateral pleural calcifications. He has not had a recent pulmonary function test, and his last CT scan in May confirmed severe emphysema.  He requires assistance from his son for care and transportation. He experiences shortness of breath and difficulty breathing, especially during exertion. No feeling worse than at discharge but is not back to his baseline.       Allergies  Allergen Reactions   Codeine Itching    Immunization History  Administered Date(s) Administered   Fluad Quad(high Dose 65+) 09/15/2019, 10/16/2020   Influenza Split 08/16/2013   Influenza Whole 08/09/2018   Influenza, High Dose Seasonal PF 08/26/2015   Influenza,inj,Quad PF,6+ Mos 11/07/2014, 09/22/2017, 08/19/2018   PFIZER(Purple Top)SARS-COV-2 Vaccination 01/24/2020, 02/18/2020   Pneumococcal Conjugate-13 08/26/2015   Pneumococcal Polysaccharide-23 08/12/2012    Past Medical History:  Diagnosis Date   COPD (chronic obstructive pulmonary disease) (HCC)    Essential hypertension    History of asbestosis 2010   History of cardiac  catheterization 2012   Reportedly normal coronaries   Respiratory failure with hypoxia (HCC)    Home oxygen     Tobacco History: Social History   Tobacco Use  Smoking Status Former   Current packs/day: 0.00   Average packs/day: 1 pack/day for 42.0 years (42.0 ttl pk-yrs)   Types: Cigarettes   Start date: 40   Quit date: 2004   Years since quitting: 21.4  Smokeless Tobacco Never   Counseling given: Not Answered   Outpatient Medications Prior to Visit  Medication Sig Dispense Refill   albuterol  (VENTOLIN  HFA) 108 (90 Base) MCG/ACT inhaler Inhale 2 puffs into the lungs every 6 (six) hours as needed for wheezing or shortness of breath. 18 g 3   amLODipine (NORVASC) 5 MG tablet Take 5 mg by mouth daily.     atorvastatin  (LIPITOR) 40 MG tablet Take 1 tablet (40 mg total) by mouth daily. 90 tablet 2   budeson-glycopyrrolate-formoterol (BREZTRI  AEROSPHERE) 160-9-4.8 MCG/ACT AERO Inhale 2 puffs into the lungs in the morning and at bedtime. 10.7 g 11   citalopram (CELEXA) 20 MG tablet Take 20 mg by mouth at bedtime.     clopidogrel  (PLAVIX ) 75 MG tablet Take 75 mg by mouth daily.     ipratropium-albuterol  (DUONEB) 0.5-2.5 (3) MG/3ML SOLN Take 3 mLs by nebulization every 6 (six) hours as needed. 360 mL 5   lisinopril (ZESTRIL) 10 MG tablet Take  10 mg by mouth daily.     OXYGEN  2 LPM     No facility-administered medications prior to visit.   Review of Systems  Review of Systems  Constitutional: Negative.   HENT: Negative.    Respiratory:  Positive for cough, chest tightness and shortness of breath.   Cardiovascular: Negative.    Physical Exam  There were no vitals taken for this visit. Physical Exam Constitutional:      General: He is not in acute distress.    Appearance: Normal appearance.     Comments: Chronically ill   HENT:     Head: Normocephalic and atraumatic.  Cardiovascular:     Rate and Rhythm: Normal rate and regular rhythm.  Pulmonary:     Effort: Pulmonary  effort is normal.     Breath sounds: Normal breath sounds. No wheezing, rhonchi or rales.     Comments: Diminished; 2L POC Musculoskeletal:        General: Normal range of motion.  Skin:    General: Skin is warm and dry.  Neurological:     General: No focal deficit present.     Mental Status: He is alert and oriented to person, place, and time. Mental status is at baseline.  Psychiatric:        Mood and Affect: Mood normal.        Behavior: Behavior normal.        Thought Content: Thought content normal.        Judgment: Judgment normal.      Lab Results:  CBC    Component Value Date/Time   WBC 5.7 04/26/2022 0601   RBC 4.15 (L) 04/26/2022 0601   HGB 12.7 (L) 04/26/2022 0601   HCT 39.7 04/26/2022 0601   PLT 160 04/26/2022 0601   MCV 95.7 04/26/2022 0601   MCH 30.6 04/26/2022 0601   MCHC 32.0 04/26/2022 0601   RDW 13.1 04/26/2022 0601   LYMPHSABS 1.9 04/25/2022 0954   MONOABS 0.6 04/25/2022 0954   EOSABS 0.2 04/25/2022 0954   BASOSABS 0.1 04/25/2022 0954    BMET    Component Value Date/Time   NA 140 01/29/2024 1448   K 4.9 01/29/2024 1448   CL 104 01/29/2024 1448   CO2 23 01/29/2024 1448   GLUCOSE 97 01/29/2024 1448   GLUCOSE 98 04/26/2022 0601   BUN 18 01/29/2024 1448   CREATININE 0.97 01/29/2024 1448   CALCIUM  9.1 01/29/2024 1448   GFRNONAA >60 04/26/2022 0601   GFRAA >60 02/06/2018 0357    BNP    Component Value Date/Time   BNP 128.0 (H) 02/05/2018 1940    ProBNP No results found for: "PROBNP"  Imaging: No results found.   Assessment & Plan:    1. Bullous emphysema (HCC) (Primary) - DG Chest 2 View; Future  2. Community acquired pneumonia of right middle lobe of lung - DG Chest 2 View; Future  3. Chronic obstructive pulmonary disease, unspecified COPD type (HCC)  4. Chronic hypoxemic respiratory failure (HCC)  Assessment and Plan    Acute COPD exacerbation Recent exacerbation treated with IV antibiotics and prednisone . Symptoms  improved but not at baseline. Experiencing dyspnea and chest heat during episodes. Discussed adding Ohtuvayre  nebulizer to decrease inflammation and increase activity tolerance. Explained Ohtuvayre  is a PDE4 inhibitor, not a steroid, and may be beneficial in decreasing inflammation and reduce flare ups. If Ohtuvayre  is not affordable, Daliresp is an alternative with more GI side effects. Emphasized affordability and potential patient assistance programs. - Continue  Breztri  Aerosphere, two puffs twice daily. - Use ipratropium-albuterol  nebulizer as needed for acute symptoms. - Add Ohtuvayre  nebulizer as a maintenance regimen, to be used morning and evening. - Consider Daliresp if Ohtuvayre  is not affordable. - Recommend home-based pulmonary rehabilitation with [facility] due to transportation and physical limitations. - Schedule a 30-minute spirometry test to assess lung function.  Severe emphysema Severe emphysema confirmed by recent CT scan. Symptoms include dyspnea and decreased activity tolerance. Discussed potential addition of prophylactic antibiotics or low-dose prednisone  if symptoms persist despite current regimen. - Continue current inhaler regimen with Breztri  Aerosphere. Previous tried Brovana , pulmicort  and Trelegy.  - Recommend pulmonary rehabilitation to improve activity tolerance.  Chronic respiratory failure Requires two liters of oxygen  continuously, which is baseline for chronic respiratory failure. - Continue oxygen  therapy at two liters per minute.  Right middle lobe pneumonia Treated with IV antibiotics during hospitalization. Discharged on Augmentin and doxycycline. Symptoms improved but not fully resolved. - Complete course of Augmentin and doxycycline as prescribed. - Get follow-up CXR in 4 weeks prior to visit   Paroxysmal atrial fibrillation Atrial fibrillation with recent episode. CHA2DS2-VASc score of 6, indicating high stroke risk. Not on Eliquis due to cost issues.  Explained importance of anticoagulation to prevent stroke due to blood pooling and clot formation. - Contact pharmacy team to address cost issues with Eliquis. - Consider patient assistance programs or samples for Eliquis.  History of cerebrovascular accident (CVA) TIA with recent hospitalization for symptoms. High stroke risk due to atrial fibrillation. - Ensure anticoagulation with Eliquis once cost issues are resolved.  History of asbestos exposure with pleural plaques Pleural plaques on CT scan, consistent with asbestos exposure. No significant changes since previous imaging.   Antonio Baumgarten, NP 04/06/2024

## 2024-04-06 NOTE — Telephone Encounter (Signed)
 Spoke with patient and will mail form for completion

## 2024-04-06 NOTE — Telephone Encounter (Signed)
 Starting patient on ohtuvayre , form has been completed   Also patient was recently prescribed Eliquis for afib/CVA during hospitalization in May but family states that it was too expensive and was not able to fill prescription, do you have information for patient assistance or do you know anyone in cardiology that can assist with samples

## 2024-04-07 ENCOUNTER — Telehealth: Payer: Self-pay

## 2024-04-07 DIAGNOSIS — J61 Pneumoconiosis due to asbestos and other mineral fibers: Secondary | ICD-10-CM | POA: Diagnosis not present

## 2024-04-07 DIAGNOSIS — Z7951 Long term (current) use of inhaled steroids: Secondary | ICD-10-CM | POA: Diagnosis not present

## 2024-04-07 DIAGNOSIS — I110 Hypertensive heart disease with heart failure: Secondary | ICD-10-CM | POA: Diagnosis not present

## 2024-04-07 DIAGNOSIS — I5032 Chronic diastolic (congestive) heart failure: Secondary | ICD-10-CM | POA: Diagnosis not present

## 2024-04-07 DIAGNOSIS — Z7902 Long term (current) use of antithrombotics/antiplatelets: Secondary | ICD-10-CM | POA: Diagnosis not present

## 2024-04-07 DIAGNOSIS — I69320 Aphasia following cerebral infarction: Secondary | ICD-10-CM | POA: Diagnosis not present

## 2024-04-07 DIAGNOSIS — I69398 Other sequelae of cerebral infarction: Secondary | ICD-10-CM | POA: Diagnosis not present

## 2024-04-07 DIAGNOSIS — Z556 Problems related to health literacy: Secondary | ICD-10-CM | POA: Diagnosis not present

## 2024-04-07 DIAGNOSIS — E785 Hyperlipidemia, unspecified: Secondary | ICD-10-CM | POA: Diagnosis not present

## 2024-04-07 DIAGNOSIS — R531 Weakness: Secondary | ICD-10-CM | POA: Diagnosis not present

## 2024-04-07 DIAGNOSIS — J9611 Chronic respiratory failure with hypoxia: Secondary | ICD-10-CM | POA: Diagnosis not present

## 2024-04-07 DIAGNOSIS — Z9981 Dependence on supplemental oxygen: Secondary | ICD-10-CM | POA: Diagnosis not present

## 2024-04-07 DIAGNOSIS — K635 Polyp of colon: Secondary | ICD-10-CM | POA: Diagnosis not present

## 2024-04-07 DIAGNOSIS — J449 Chronic obstructive pulmonary disease, unspecified: Secondary | ICD-10-CM | POA: Diagnosis not present

## 2024-04-07 NOTE — Telephone Encounter (Signed)
 Received Ohtuvayre new start paperwork. Completed form and faxed with clinicals and insurance card copy to [facility] [employer]   Phone#: [phone] Fax#: [phone]

## 2024-04-08 DIAGNOSIS — Z9981 Dependence on supplemental oxygen: Secondary | ICD-10-CM | POA: Diagnosis not present

## 2024-04-08 DIAGNOSIS — Z556 Problems related to health literacy: Secondary | ICD-10-CM | POA: Diagnosis not present

## 2024-04-08 DIAGNOSIS — I69398 Other sequelae of cerebral infarction: Secondary | ICD-10-CM | POA: Diagnosis not present

## 2024-04-08 DIAGNOSIS — I110 Hypertensive heart disease with heart failure: Secondary | ICD-10-CM | POA: Diagnosis not present

## 2024-04-08 DIAGNOSIS — K635 Polyp of colon: Secondary | ICD-10-CM | POA: Diagnosis not present

## 2024-04-08 DIAGNOSIS — J449 Chronic obstructive pulmonary disease, unspecified: Secondary | ICD-10-CM | POA: Diagnosis not present

## 2024-04-08 DIAGNOSIS — Z7902 Long term (current) use of antithrombotics/antiplatelets: Secondary | ICD-10-CM | POA: Diagnosis not present

## 2024-04-08 DIAGNOSIS — I5032 Chronic diastolic (congestive) heart failure: Secondary | ICD-10-CM | POA: Diagnosis not present

## 2024-04-08 DIAGNOSIS — J61 Pneumoconiosis due to asbestos and other mineral fibers: Secondary | ICD-10-CM | POA: Diagnosis not present

## 2024-04-08 DIAGNOSIS — I69320 Aphasia following cerebral infarction: Secondary | ICD-10-CM | POA: Diagnosis not present

## 2024-04-08 DIAGNOSIS — J9611 Chronic respiratory failure with hypoxia: Secondary | ICD-10-CM | POA: Diagnosis not present

## 2024-04-08 DIAGNOSIS — Z7951 Long term (current) use of inhaled steroids: Secondary | ICD-10-CM | POA: Diagnosis not present

## 2024-04-08 DIAGNOSIS — E785 Hyperlipidemia, unspecified: Secondary | ICD-10-CM | POA: Diagnosis not present

## 2024-04-08 DIAGNOSIS — R531 Weakness: Secondary | ICD-10-CM | POA: Diagnosis not present

## 2024-04-08 NOTE — Telephone Encounter (Signed)
 Received confirmation of Ohtuvayre  enrollment form receipt  Patient ID: [id number]

## 2024-04-14 DIAGNOSIS — Z556 Problems related to health literacy: Secondary | ICD-10-CM | POA: Diagnosis not present

## 2024-04-14 DIAGNOSIS — Z7951 Long term (current) use of inhaled steroids: Secondary | ICD-10-CM | POA: Diagnosis not present

## 2024-04-14 DIAGNOSIS — K635 Polyp of colon: Secondary | ICD-10-CM | POA: Diagnosis not present

## 2024-04-14 DIAGNOSIS — J9611 Chronic respiratory failure with hypoxia: Secondary | ICD-10-CM | POA: Diagnosis not present

## 2024-04-14 DIAGNOSIS — I69320 Aphasia following cerebral infarction: Secondary | ICD-10-CM | POA: Diagnosis not present

## 2024-04-14 DIAGNOSIS — I110 Hypertensive heart disease with heart failure: Secondary | ICD-10-CM | POA: Diagnosis not present

## 2024-04-14 DIAGNOSIS — I5032 Chronic diastolic (congestive) heart failure: Secondary | ICD-10-CM | POA: Diagnosis not present

## 2024-04-14 DIAGNOSIS — J449 Chronic obstructive pulmonary disease, unspecified: Secondary | ICD-10-CM | POA: Diagnosis not present

## 2024-04-14 DIAGNOSIS — I69398 Other sequelae of cerebral infarction: Secondary | ICD-10-CM | POA: Diagnosis not present

## 2024-04-14 DIAGNOSIS — E785 Hyperlipidemia, unspecified: Secondary | ICD-10-CM | POA: Diagnosis not present

## 2024-04-14 DIAGNOSIS — R531 Weakness: Secondary | ICD-10-CM | POA: Diagnosis not present

## 2024-04-14 DIAGNOSIS — J61 Pneumoconiosis due to asbestos and other mineral fibers: Secondary | ICD-10-CM | POA: Diagnosis not present

## 2024-04-14 DIAGNOSIS — Z9981 Dependence on supplemental oxygen: Secondary | ICD-10-CM | POA: Diagnosis not present

## 2024-04-14 DIAGNOSIS — Z7902 Long term (current) use of antithrombotics/antiplatelets: Secondary | ICD-10-CM | POA: Diagnosis not present

## 2024-04-15 DIAGNOSIS — I5032 Chronic diastolic (congestive) heart failure: Secondary | ICD-10-CM | POA: Diagnosis not present

## 2024-04-15 DIAGNOSIS — K635 Polyp of colon: Secondary | ICD-10-CM | POA: Diagnosis not present

## 2024-04-15 DIAGNOSIS — Z9981 Dependence on supplemental oxygen: Secondary | ICD-10-CM | POA: Diagnosis not present

## 2024-04-15 DIAGNOSIS — Z7951 Long term (current) use of inhaled steroids: Secondary | ICD-10-CM | POA: Diagnosis not present

## 2024-04-15 DIAGNOSIS — J61 Pneumoconiosis due to asbestos and other mineral fibers: Secondary | ICD-10-CM | POA: Diagnosis not present

## 2024-04-15 DIAGNOSIS — E785 Hyperlipidemia, unspecified: Secondary | ICD-10-CM | POA: Diagnosis not present

## 2024-04-15 DIAGNOSIS — I69320 Aphasia following cerebral infarction: Secondary | ICD-10-CM | POA: Diagnosis not present

## 2024-04-15 DIAGNOSIS — Z7902 Long term (current) use of antithrombotics/antiplatelets: Secondary | ICD-10-CM | POA: Diagnosis not present

## 2024-04-15 DIAGNOSIS — R531 Weakness: Secondary | ICD-10-CM | POA: Diagnosis not present

## 2024-04-15 DIAGNOSIS — Z556 Problems related to health literacy: Secondary | ICD-10-CM | POA: Diagnosis not present

## 2024-04-15 DIAGNOSIS — I69398 Other sequelae of cerebral infarction: Secondary | ICD-10-CM | POA: Diagnosis not present

## 2024-04-15 DIAGNOSIS — I110 Hypertensive heart disease with heart failure: Secondary | ICD-10-CM | POA: Diagnosis not present

## 2024-04-15 DIAGNOSIS — J9611 Chronic respiratory failure with hypoxia: Secondary | ICD-10-CM | POA: Diagnosis not present

## 2024-04-15 DIAGNOSIS — J449 Chronic obstructive pulmonary disease, unspecified: Secondary | ICD-10-CM | POA: Diagnosis not present

## 2024-04-19 ENCOUNTER — Encounter (HOSPITAL_BASED_OUTPATIENT_CLINIC_OR_DEPARTMENT_OTHER)

## 2024-04-19 ENCOUNTER — Ambulatory Visit (INDEPENDENT_AMBULATORY_CARE_PROVIDER_SITE_OTHER): Admitting: Pulmonary Disease

## 2024-04-19 DIAGNOSIS — Z87891 Personal history of nicotine dependence: Secondary | ICD-10-CM | POA: Diagnosis not present

## 2024-04-19 DIAGNOSIS — J449 Chronic obstructive pulmonary disease, unspecified: Secondary | ICD-10-CM | POA: Diagnosis not present

## 2024-04-19 DIAGNOSIS — G459 Transient cerebral ischemic attack, unspecified: Secondary | ICD-10-CM | POA: Diagnosis not present

## 2024-04-19 DIAGNOSIS — J439 Emphysema, unspecified: Secondary | ICD-10-CM

## 2024-04-19 DIAGNOSIS — I5032 Chronic diastolic (congestive) heart failure: Secondary | ICD-10-CM | POA: Diagnosis not present

## 2024-04-19 DIAGNOSIS — J9811 Atelectasis: Secondary | ICD-10-CM | POA: Diagnosis not present

## 2024-04-19 DIAGNOSIS — R069 Unspecified abnormalities of breathing: Secondary | ICD-10-CM | POA: Diagnosis not present

## 2024-04-19 DIAGNOSIS — Z7902 Long term (current) use of antithrombotics/antiplatelets: Secondary | ICD-10-CM | POA: Diagnosis not present

## 2024-04-19 DIAGNOSIS — E785 Hyperlipidemia, unspecified: Secondary | ICD-10-CM | POA: Diagnosis not present

## 2024-04-19 DIAGNOSIS — K635 Polyp of colon: Secondary | ICD-10-CM | POA: Diagnosis not present

## 2024-04-19 DIAGNOSIS — I509 Heart failure, unspecified: Secondary | ICD-10-CM | POA: Diagnosis not present

## 2024-04-19 DIAGNOSIS — J929 Pleural plaque without asbestos: Secondary | ICD-10-CM | POA: Diagnosis not present

## 2024-04-19 DIAGNOSIS — Z7951 Long term (current) use of inhaled steroids: Secondary | ICD-10-CM | POA: Diagnosis not present

## 2024-04-19 DIAGNOSIS — Z9981 Dependence on supplemental oxygen: Secondary | ICD-10-CM | POA: Diagnosis not present

## 2024-04-19 DIAGNOSIS — I110 Hypertensive heart disease with heart failure: Secondary | ICD-10-CM | POA: Diagnosis not present

## 2024-04-19 DIAGNOSIS — R531 Weakness: Secondary | ICD-10-CM | POA: Diagnosis not present

## 2024-04-19 DIAGNOSIS — Z79899 Other long term (current) drug therapy: Secondary | ICD-10-CM | POA: Diagnosis not present

## 2024-04-19 DIAGNOSIS — R918 Other nonspecific abnormal finding of lung field: Secondary | ICD-10-CM | POA: Diagnosis not present

## 2024-04-19 DIAGNOSIS — Z885 Allergy status to narcotic agent status: Secondary | ICD-10-CM | POA: Diagnosis not present

## 2024-04-19 DIAGNOSIS — Z8673 Personal history of transient ischemic attack (TIA), and cerebral infarction without residual deficits: Secondary | ICD-10-CM | POA: Diagnosis not present

## 2024-04-19 DIAGNOSIS — I69398 Other sequelae of cerebral infarction: Secondary | ICD-10-CM | POA: Diagnosis not present

## 2024-04-19 DIAGNOSIS — Z1152 Encounter for screening for COVID-19: Secondary | ICD-10-CM | POA: Diagnosis not present

## 2024-04-19 DIAGNOSIS — I69320 Aphasia following cerebral infarction: Secondary | ICD-10-CM | POA: Diagnosis not present

## 2024-04-19 DIAGNOSIS — Z66 Do not resuscitate: Secondary | ICD-10-CM | POA: Diagnosis not present

## 2024-04-19 DIAGNOSIS — R0602 Shortness of breath: Secondary | ICD-10-CM | POA: Diagnosis not present

## 2024-04-19 DIAGNOSIS — I5030 Unspecified diastolic (congestive) heart failure: Secondary | ICD-10-CM | POA: Diagnosis not present

## 2024-04-19 DIAGNOSIS — J9611 Chronic respiratory failure with hypoxia: Secondary | ICD-10-CM | POA: Diagnosis not present

## 2024-04-19 DIAGNOSIS — J61 Pneumoconiosis due to asbestos and other mineral fibers: Secondary | ICD-10-CM | POA: Diagnosis not present

## 2024-04-19 DIAGNOSIS — Z792 Long term (current) use of antibiotics: Secondary | ICD-10-CM | POA: Diagnosis not present

## 2024-04-19 DIAGNOSIS — Z7901 Long term (current) use of anticoagulants: Secondary | ICD-10-CM | POA: Diagnosis not present

## 2024-04-19 DIAGNOSIS — Z556 Problems related to health literacy: Secondary | ICD-10-CM | POA: Diagnosis not present

## 2024-04-19 DIAGNOSIS — J441 Chronic obstructive pulmonary disease with (acute) exacerbation: Secondary | ICD-10-CM | POA: Diagnosis not present

## 2024-04-19 DIAGNOSIS — J9621 Acute and chronic respiratory failure with hypoxia: Secondary | ICD-10-CM | POA: Diagnosis not present

## 2024-04-19 DIAGNOSIS — R0902 Hypoxemia: Secondary | ICD-10-CM | POA: Diagnosis not present

## 2024-04-19 DIAGNOSIS — I480 Paroxysmal atrial fibrillation: Secondary | ICD-10-CM | POA: Diagnosis not present

## 2024-04-19 LAB — PULMONARY FUNCTION TEST
DL/VA % pred: 51 %
DL/VA: 2.04 ml/min/mmHg/L
DLCO cor % pred: 32 %
DLCO cor: 7.55 ml/min/mmHg
DLCO unc % pred: 30 %
DLCO unc: 7.12 ml/min/mmHg
FEF 25-75 Post: 0.28 L/s
FEF 25-75 Pre: 0.26 L/s
FEF2575-%Change-Post: 11 %
FEF2575-%Pred-Post: 15 %
FEF2575-%Pred-Pre: 13 %
FEV1-%Change-Post: 5 %
FEV1-%Pred-Post: 23 %
FEV1-%Pred-Pre: 22 %
FEV1-Post: 0.64 L
FEV1-Pre: 0.6 L
FEV1FVC-%Change-Post: 1 %
FEV1FVC-%Pred-Pre: 49 %
FEV6-%Change-Post: 4 %
FEV6-%Pred-Post: 47 %
FEV6-%Pred-Pre: 45 %
FEV6-Post: 1.68 L
FEV6-Pre: 1.6 L
FEV6FVC-%Change-Post: 0 %
FEV6FVC-%Pred-Post: 101 %
FEV6FVC-%Pred-Pre: 101 %
FVC-%Change-Post: 4 %
FVC-%Pred-Post: 46 %
FVC-%Pred-Pre: 44 %
FVC-Post: 1.77 L
FVC-Pre: 1.69 L
Post FEV1/FVC ratio: 36 %
Post FEV6/FVC ratio: 95 %
Pre FEV1/FVC ratio: 36 %
Pre FEV6/FVC Ratio: 95 %

## 2024-04-19 NOTE — Patient Instructions (Signed)
 Full PFT performed today.

## 2024-04-19 NOTE — Progress Notes (Signed)
Full PFT performed today, 

## 2024-04-20 ENCOUNTER — Ambulatory Visit (HOSPITAL_BASED_OUTPATIENT_CLINIC_OR_DEPARTMENT_OTHER): Admitting: Primary Care

## 2024-04-21 NOTE — Discharge Summary (Signed)
 Hospitalist Discharge Summary   Discharge date:   April 21, 2024 Length of stay:    LOS: 1 day    Discharge Service:   [facility] Hospitalists Discharge Attending Physician: Casimiro Charlyne Seidel, MD Discharge to:    To Home with Home Health Condition at Discharge:  fair   Mental Status On day of Discharge:  The patient is Alert and oriented to PERSON The patient is Alert And oriented to TIME The patient is Alert and oriented to [facility] course based on timeline of significant events after admission (by date):    04/21/24: Patient is maintaining O2 saturation in the 90s at 2 L/min nasal cannula which is his usual home setting.  WBC count is normal, chest x-ray shows no new infiltrate or edema.  I have adjusted his dose of risperidone and citalopram to hopefully help with what seems to be panic attacks.  Patient is otherwise well and has been ambulating with physical therapy.  He is ready for discharge today.  04/20/24: Patient is back to baseline O2 requirement.  Workup so far has been unremarkable, white count is normal.  Patient admits anxiety symptoms which may account for his sensation of worsening shortness of breath.  Review of home medications reveals he is on a very small dose of risperidone, I will adjust medication.  He has been able to ambulate with physical therapy today.  ______________________________________   Admission HPI   Patient admitted on: 04/19/2024 10:43 PM  Patient admitted by: Thersia Roger, DO   CHIEF COMPLAINT:  Shortness of breath   Day of admission HPI:  This is a 78 y.o. y.o. 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  States that he had an uneventful day, saw his pulmonologist in [place], but suddenly became SOB and symptoms were refractory to increased O2  at 4L and nebs.     Of note he was admitted from 5/25 -5/30 for COPD exac 2/2 CAP and on 5/6 for TIA   Otherwise there has been no change in status. Patient has been taking medication as prescribed and there has been no recent change in medication or diet. There has been no recent illness/hospitalizations, travel or sick contacts.  Patient denies fevers/chills, weakness, dizziness, chest pain,N/V/C/D, abdominal pain, dysuria/frequency, changes in mental status.  In the ED the patient received albuterol , methylpred.   Medical admission was requested for further workup and management of Acute on chronic hypoxic rsp failure 2/2 COPD exac.   Patient admitted on Home O2? - yes Patient on home anticoagulant? -  yes Patient admitted with Chronic home foley catheter? - no Foley catheter placed or replaced by another service prior to admission? - no  Mental Status on Admission: The patient is Alert and oriented to PERSON The patient is Alert And oriented to TIME The patient is Alert and oriented to LOCATION   Problem List, Assessment & Plan    ASSESSMENT & PLAN (In order of descending acuity)   Acute on chronic hypoxic resp failure 2/2 COPD Exac - Continue empiric antibiotics using azithromycin x 3 weekly - Will complete 5 additional days of prednisone  40 mg - Continue O2, nebs, Breo, Trelegy and Incruse - Resp path panel is negative - Dose of risperidone and citalopram adjusted to help control anxiety symptoms  TIA/history of CVA now with recurrent/paroxysmal atrial fibrillation - Continue Eliquis   HFpEF - Patient appears to have history of reduced ejection fraction however on most recent echocardiogram around 2 years ago his EF has recovered - He is not currently on any diuretics - Continue atorvastatin    HTN - Continue home amlodipine, lisinopril with parameters  ADDITIONAL PATIENT FINDINGS OR OBSERVATIONS   DVT prophylaxis while in hospital:  on other anticoagulant  (apixaban, rivaroxaban, dabigatran, prasugrel)   _____________________________________  Vital Signs: BP 119/61   Pulse 107   Temp 36.5 C (97.7 F) (Oral)   Resp 22   Ht 172.7 cm (5' 8)   Wt 62.8 kg (138 lb 6.4 oz)   SpO2 93%   BMI 21.04 kg/m   Nutrition:                                                  CODE STATUS :                    DNR and DNI   Patient discharged on Home O2? - yes Patient discharged on home anticoagulant? -  yes Patient discharged with Chronic home foley catheter? - no  Time Spent on Discharge I spent greater than 30 minutes counseling and coordinating care for the discharge of this patient. The patient and I discussed the importance of outpatient follow-up as well as concerning signs and symptoms that would require immediate evaluation by a medical professional. The aforementioned conversation participants understand  and show insight. I use teachback to ensure understanding. The above participant/s is aware that not following the discussed plan, recommendations, and follow up can lead to severe negative effects on the patient's health, up to and including death.    Discharge Medications     Your Medication List     STOP taking these medications    amoxicillin-clavulanate 875-125 mg per tablet Commonly known as: AUGMENTIN   doxycycline 100 MG tablet Commonly known as: VIBRA-TABS       START taking these medications    azithromycin 250 MG tablet Commonly known as: ZITHROMAX Z-PAK Take 1 tablet (250 mg total) by mouth 3 (three) times a week. Take 2 tablets (500 mg) on  Day 1,  followed by 1 tablet (250 mg) once daily on Days 2 through 5.       CHANGE how you take these medications    citalopram 20 MG tablet Commonly known as: CeleXA Take 2 tablets (40 mg total) by mouth daily. What changed: how much to take   risperiDONE 1 MG tablet Commonly known as: RisperDAL Take 1 tablet (1 mg total) by mouth two (2) times a  day. What changed:  medication strength how much to take       CONTINUE taking these medications    albuterol  90 mcg/actuation inhaler Commonly known as: PROVENTIL  HFA;VENTOLIN  HFA Inhale 2 puffs.   albuterol  2.5 mg /3 mL (0.083 %) nebulizer solution Inhale 3 mL (2.5 mg total) four (4) times a day as needed.   amlodipine 10 MG tablet Commonly known as: NORVASC Take 0.5 tablets (5 mg total) by mouth daily.   apixaban 5 mg Tab Commonly known as: ELIQUIS Take 1 tablet (5 mg total) by mouth two (2) times a day.   atorvastatin  40 MG tablet Commonly known as: LIPITOR Take 1 tablet (  40 mg total) by mouth daily.   BREZTRI  AEROSPHERE 160-9-4.8 mcg/actuation inhaler Generic drug: budesonide -glycopyr-formoterol Inhale 2 puffs  in the morning and 2 puffs in the evening.   ipratropium-albuterol  0.5-2.5 mg/3 mL nebulizer Commonly known as: DUO-NEB Inhale 3 mL every six (6) hours as needed.   lisinopril 10 MG tablet Commonly known as: PRINIVIL,ZESTRIL Take 1 tablet (10 mg total) by mouth daily.   OXYGEN -AIR DELIVERY SYSTEMS MISC 2 LPM   predniSONE  20 MG tablet Commonly known as: DELTASONE  Take 2 tablets (40 mg total) by mouth daily.   TRELEGY ELLIPTA  100-62.5-25 mcg inhaler Generic drug: fluticasone-umeclidin-vilanter Inhale 1 puff daily.   umeclidinium 62.5 mcg/actuation inhaler Commonly known as: INCRUSE ELLIPTA Inhale 1 puff  in the morning.       ____________________________________________  Discharge Instructions   Nutrition:                                   Activity:                                    Appointments:                         Appointments which have been scheduled for you    May 03, 2024 2:40 PM (Arrive by 2:10 PM) HOSPITAL FOLLOW UP CARDIOLOGY with [facility] CARDIOLOGY AT [facility] [place]) [address] [place] [address] [phone]         Follow Up:                                   Allergies  Allergies[1]    Past Medical History  Past Medical History[2]     Lab Results   Recent Labs    Units 04/19/24 2253 04/20/24 0615  WBC 10*9/L 7.5 4.5  HGB g/dL 87.1 87.3  HCT % 61.7 61.7  PLT 10*9/L 195 201   Recent Labs    Units 04/19/24 2253 04/20/24 0615  NA mmol/L 141 141  K mmol/L 3.7 4.3  CL mmol/L 104 107  CO2 mmol/L 25.6 25.7  BUN mg/dL 13 13  CREATININE mg/dL 9.24* 9.15  GLU mg/dL 897 836  CALCIUM  mg/dL 8.8 8.6  ALBUMIN g/dL 2.9*  --   PROT g/dL 6.3  --   BILITOT mg/dL 0.4  --   AST U/L 15  --   ALT U/L 18  --   ALKPHOS U/L 62  --    Recent Labs    Units 04/19/24 2253  TROPONINI ng/L 20   Recent Labs    Units 04/19/24 2253  INR  0.99    Recent Labs    04/19/24 2257  PHART 7.44  PCO2ART 35.0  PO2ART 72*  HCO3ART 23.8*  O2SATART 97.2  BEART 0.1    Imaging   Echocardiogram Follow Up/ Limited W Doppler Result Date: 04/20/2024 Patient Info Name:     Timothy [name] Age:     4 years DOB:     07/17/1946 Gender:     Male MRN:     [id number] Accession #:     [id number] [facility] Account #:     [id number] Ht:     173 cm Wt:  62 kg BSA:     1.72 m2 BP:     127 /     66 mmHg HR:     75 bpm Exam Date:     04/20/2024 7:14 AM Admit Date:     04/19/2024 Exam Type:     ECHOCARDIOGRAM FOLLOW UP/ LIMITED W DOPPLER Technical Quality:     Poor Reason for Poor Study:     poor echocardiographic windows Staff Sonographer:     Mliss Gate Supervising Physician:     Concha Lonni An MD Referring Physician:     Casimiro Charlyne Seidel Ordering Physician:     Thersia Roger Study Info Indications      - HF, Sob Procedure(s)   Limited 2D, color flow and Doppler transthoracic echocardiogram is performed. Summary   1. Very technically difficult study.   2. The left ventricle is not well visualized but probably normal in size with probably normal wall thickness.   3. The left ventricular systolic function is normal, LVEF is visually estimated  at > 55%.   4. The right ventricle is not well visualized but probably normal in size, with probably normal systolic function. Left Ventricle   The left ventricle is not well visualized but probably normal in size with probably normal wall thickness. The left ventricular systolic function is normal, LVEF is visually estimated at > 55%. Left ventricular diastolic function cannot be accurately assessed. There is evidence of impaired left ventricular relaxation. Right Ventricle   The right ventricle is not well visualized but probably normal in size, with probably normal systolic function. Left Atrium   The left atrium is normal in size. Right Atrium   The right atrium is normal in size. Aortic Valve   The aortic valve is not well visualized. There is no significant aortic regurgitation. There is no evidence of a significant transvalvular gradient. Mitral Valve   The mitral valve leaflets are normal with normal leaflet mobility. There is no significant mitral valve regurgitation. Tricuspid Valve   The tricuspid valve leaflets are normal, with normal leaflet mobility. There is no significant tricuspid regurgitation. Pulmonic Valve   Pulmonary valve is not well visualized. Aorta   The aorta is not well visualized. Inferior Vena Cava   The IVC is not well visualized precluding the ability to accurate assess right atrial pressure. Pericardium/Pleural   There is no pericardial effusion. Ventricles ---------------------------------------------------------------------- Name                                 Value        Normal ---------------------------------------------------------------------- LV Function ---------------------------------------------------------------------- LV EF (4C MOD)                        64 %                LV Diastolic Volume Index (BP MOD)                        28.6 ml/m2     34.0-74.0 LV EF (BP MOD)                        62 %         52-72 RV Dimensions 2D/MM  ----------------------------------------------------------------------  RV Basal Diastolic Dimension  3.5 cm       2.5-4.1 TAPSE                               2.5 cm         >=1.7 Atria ---------------------------------------------------------------------- Name                                 Value        Normal ---------------------------------------------------------------------- LA Dimensions ---------------------------------------------------------------------- LA Volume Index (4C A-L)        9.99 ml/m2               LA Volume Index (2C A-L)        21.63 ml/m2               LA Volume (BP MOD)                   24 ml               LA Volume Index (BP MOD)        14.10 ml/m2   16.00-34.00 RA Dimensions ---------------------------------------------------------------------- RA Area (4C)                      10.7 cm2        <=18.0 RA Area (4C) Index              6.2 cm2/m2               RA ESV Index (4C MOD)             13 ml/m2         18-32 Left Ventricular Outflow Tract ---------------------------------------------------------------------- Name                                 Value        Normal ---------------------------------------------------------------------- LVOT Doppler ---------------------------------------------------------------------- LVOT Peak Velocity                 0.8 m/s               LVOT VTI                             15 cm Aortic Valve ---------------------------------------------------------------------- Name                                 Value        Normal ---------------------------------------------------------------------- AV Doppler ---------------------------------------------------------------------- AV Peak Velocity                   1.0 m/s               AV Peak Gradient                    4 mmHg               AV Mean Gradient                    2 mmHg               AV VTI  21 cm               AV DI (Vel)                            0.80               AV DI (VTI)                           0.72 Mitral Valve ---------------------------------------------------------------------- Name                                 Value        Normal ---------------------------------------------------------------------- MV Diastolic Function ---------------------------------------------------------------------- MV E Peak Velocity                 56 cm/s               MV A Peak Velocity                 87 cm/s               MV E/A                                 0.6               MV Annular TDI ---------------------------------------------------------------------- MV Septal e' Velocity             6.3 cm/s         >=8.0 MV E/e' (Septal)                       8.9               MV Lateral e' Velocity            6.5 cm/s        >=10.0 MV E/e' (Lateral)                      8.6               MV e' Average                     6.4 cm/s               MV E/e' (Average)                      8.7 Report Signatures Finalized by Concha Lonni An  MD on 04/20/2024 03:52 PM  XR Chest Portable Result Date: 04/19/2024 Exam:  Portable Chest  History:  Dyspnea, chest pain  Technique:  Single frontal view.  Comparison:  Mar 21, 2024  FINDINGS/IMPRESSION:   1. Hyperinflation consistent with severe emphysema. 2. Mild bibasilar subsegmental atelectasis. No acute process. 3. Bibasilar calcified pleural plaques. Attention on annual follow-up CT screening for mesothelioma. 4. Severe osteopenia/osteoporosis with chronic distal thoracic proximal lumbar compression fractures.  Signed (Electronic Signature): 04/19/2024 11:57 PM Signed By: Rozann Kail, MD   Casimiro JAYSON Seidel, MD Hospitalist, [facility] 04/21/24, 9:58 AM     [1] Allergies Allergen Reactions  . Codeine Itching  [2] Past Medical History: Diagnosis Date  . Asbestosis      . COPD (chronic obstructive pulmonary  disease)      . Hyperlipidemia   . Hypertension 02/06/2018   Formatting of this note might  be different from the original. 2010-prescribed Lisinopril, non-complinant with medication  . Oxygen  dependent   . TIA (transient ischemic attack)    Patient recently had an MRI that showed three acute or subacute infarctions in the left cerebral hemisphere

## 2024-04-22 NOTE — Telephone Encounter (Signed)
 Will fwd to pharmacy team

## 2024-04-22 NOTE — Telephone Encounter (Signed)
 Patient mailed application to office. Placed in [facility] box.

## 2024-04-23 ENCOUNTER — Telehealth: Payer: Self-pay | Admitting: *Deleted

## 2024-04-23 DIAGNOSIS — I110 Hypertensive heart disease with heart failure: Secondary | ICD-10-CM | POA: Diagnosis not present

## 2024-04-23 DIAGNOSIS — Z556 Problems related to health literacy: Secondary | ICD-10-CM | POA: Diagnosis not present

## 2024-04-23 DIAGNOSIS — I69398 Other sequelae of cerebral infarction: Secondary | ICD-10-CM | POA: Diagnosis not present

## 2024-04-23 DIAGNOSIS — I69320 Aphasia following cerebral infarction: Secondary | ICD-10-CM | POA: Diagnosis not present

## 2024-04-23 DIAGNOSIS — R531 Weakness: Secondary | ICD-10-CM | POA: Diagnosis not present

## 2024-04-23 DIAGNOSIS — I5032 Chronic diastolic (congestive) heart failure: Secondary | ICD-10-CM | POA: Diagnosis not present

## 2024-04-23 DIAGNOSIS — Z9981 Dependence on supplemental oxygen: Secondary | ICD-10-CM | POA: Diagnosis not present

## 2024-04-23 DIAGNOSIS — J61 Pneumoconiosis due to asbestos and other mineral fibers: Secondary | ICD-10-CM | POA: Diagnosis not present

## 2024-04-23 DIAGNOSIS — E785 Hyperlipidemia, unspecified: Secondary | ICD-10-CM | POA: Diagnosis not present

## 2024-04-23 DIAGNOSIS — J9611 Chronic respiratory failure with hypoxia: Secondary | ICD-10-CM | POA: Diagnosis not present

## 2024-04-23 DIAGNOSIS — K635 Polyp of colon: Secondary | ICD-10-CM | POA: Diagnosis not present

## 2024-04-23 DIAGNOSIS — Z7951 Long term (current) use of inhaled steroids: Secondary | ICD-10-CM | POA: Diagnosis not present

## 2024-04-23 DIAGNOSIS — Z7902 Long term (current) use of antithrombotics/antiplatelets: Secondary | ICD-10-CM | POA: Diagnosis not present

## 2024-04-23 DIAGNOSIS — J449 Chronic obstructive pulmonary disease, unspecified: Secondary | ICD-10-CM | POA: Diagnosis not present

## 2024-04-23 NOTE — Telephone Encounter (Signed)
 Copied from CRM [id number]. Topic: Medical Record Request - Provider/Facility Request >> Apr 21, 2024 11:42 AM Joesph PARAS wrote: Reason for CRM: Aire with [facility] (virtual pulmonary clinic) is calling to request that we send over any records pertaining to PFTs and cardiology notes if we have those on hand as well. Can c/b at [phone] for questions.  Called and obtained fax number for virtual rehab. Records faxed to them at [phone] Noithing further needed

## 2024-04-26 ENCOUNTER — Telehealth: Payer: Self-pay

## 2024-04-26 DIAGNOSIS — Z79899 Other long term (current) drug therapy: Secondary | ICD-10-CM | POA: Diagnosis not present

## 2024-04-26 DIAGNOSIS — R0902 Hypoxemia: Secondary | ICD-10-CM | POA: Diagnosis not present

## 2024-04-26 DIAGNOSIS — Z87891 Personal history of nicotine dependence: Secondary | ICD-10-CM | POA: Diagnosis not present

## 2024-04-26 DIAGNOSIS — J440 Chronic obstructive pulmonary disease with acute lower respiratory infection: Secondary | ICD-10-CM | POA: Diagnosis not present

## 2024-04-26 DIAGNOSIS — R7402 Elevation of levels of lactic acid dehydrogenase (LDH): Secondary | ICD-10-CM | POA: Diagnosis not present

## 2024-04-26 DIAGNOSIS — I10 Essential (primary) hypertension: Secondary | ICD-10-CM | POA: Diagnosis not present

## 2024-04-26 DIAGNOSIS — J441 Chronic obstructive pulmonary disease with (acute) exacerbation: Secondary | ICD-10-CM | POA: Diagnosis not present

## 2024-04-26 DIAGNOSIS — Z9981 Dependence on supplemental oxygen: Secondary | ICD-10-CM | POA: Diagnosis not present

## 2024-04-26 DIAGNOSIS — E785 Hyperlipidemia, unspecified: Secondary | ICD-10-CM | POA: Diagnosis not present

## 2024-04-26 DIAGNOSIS — J9611 Chronic respiratory failure with hypoxia: Secondary | ICD-10-CM | POA: Diagnosis not present

## 2024-04-26 DIAGNOSIS — I499 Cardiac arrhythmia, unspecified: Secondary | ICD-10-CM | POA: Diagnosis not present

## 2024-04-26 DIAGNOSIS — K4090 Unilateral inguinal hernia, without obstruction or gangrene, not specified as recurrent: Secondary | ICD-10-CM | POA: Diagnosis not present

## 2024-04-26 DIAGNOSIS — Z66 Do not resuscitate: Secondary | ICD-10-CM | POA: Diagnosis not present

## 2024-04-26 DIAGNOSIS — Z743 Need for continuous supervision: Secondary | ICD-10-CM | POA: Diagnosis not present

## 2024-04-26 DIAGNOSIS — R062 Wheezing: Secondary | ICD-10-CM | POA: Diagnosis not present

## 2024-04-26 DIAGNOSIS — I447 Left bundle-branch block, unspecified: Secondary | ICD-10-CM | POA: Diagnosis not present

## 2024-04-26 DIAGNOSIS — Z7951 Long term (current) use of inhaled steroids: Secondary | ICD-10-CM | POA: Diagnosis not present

## 2024-04-26 DIAGNOSIS — Z7901 Long term (current) use of anticoagulants: Secondary | ICD-10-CM | POA: Diagnosis not present

## 2024-04-26 DIAGNOSIS — K429 Umbilical hernia without obstruction or gangrene: Secondary | ICD-10-CM | POA: Diagnosis not present

## 2024-04-26 DIAGNOSIS — J61 Pneumoconiosis due to asbestos and other mineral fibers: Secondary | ICD-10-CM | POA: Diagnosis not present

## 2024-04-26 DIAGNOSIS — R918 Other nonspecific abnormal finding of lung field: Secondary | ICD-10-CM | POA: Diagnosis not present

## 2024-04-26 DIAGNOSIS — R0600 Dyspnea, unspecified: Secondary | ICD-10-CM | POA: Diagnosis not present

## 2024-04-26 DIAGNOSIS — Z20822 Contact with and (suspected) exposure to covid-19: Secondary | ICD-10-CM | POA: Diagnosis not present

## 2024-04-26 DIAGNOSIS — R6889 Other general symptoms and signs: Secondary | ICD-10-CM | POA: Diagnosis not present

## 2024-04-26 NOTE — Telephone Encounter (Signed)
 Copied from CRM [id number]. Topic: General - Other >> Apr 26, 2024 10:10 AM Whitney O wrote: Reason for CRM: [facility] from pulmonary reyhab program we had 2 patients that was referred to us  and we are missing some info pft the last 2 or 3 and any cardiologist notes if available and this for dr neda >> Apr 26, 2024 10:47 AM Eleanor HERO wrote: E2C2 error report also done   Printed out the the PFT and the last 2 echiocardiogram's . Faxed to [facility] [phone]

## 2024-04-27 ENCOUNTER — Ambulatory Visit (HOSPITAL_COMMUNITY): Attending: Speech Pathology | Admitting: Speech Pathology

## 2024-04-27 DIAGNOSIS — J441 Chronic obstructive pulmonary disease with (acute) exacerbation: Secondary | ICD-10-CM | POA: Diagnosis not present

## 2024-04-27 DIAGNOSIS — Z79899 Other long term (current) drug therapy: Secondary | ICD-10-CM | POA: Diagnosis not present

## 2024-04-27 DIAGNOSIS — R7402 Elevation of levels of lactic acid dehydrogenase (LDH): Secondary | ICD-10-CM | POA: Diagnosis not present

## 2024-04-27 NOTE — Nursing Note (Signed)
   04/27/24 1308  Rapid Rounds  Attendance Nursing;Physician;Advanced Practice Provider (APP);Case Manager;Dietitian/Nutrition Services;Occupational Therapy;Pharmacy;Physical Therapy;Social Work/Services  Expected Discharge Disposition HH Services  Today we still await: Clinical stability   Patient came back to the hospital after getting no relief of symptoms from his nebulizer machine. EMS says it was not working properly. Ordered patient a new nebulizer and a wheelchair from [facility]. He is a patient of [facility] and [facility]. He has home oxygen  that he wears at 2LPM.

## 2024-04-27 NOTE — Consults (Signed)
 OCCUPATIONAL THERAPY Evaluation (04/27/24 0901)  Patient Name:  Timothy [name]      Medical Record Number: [id number]   Date of Birth: 05-17-1946 Sex: Male    Post-Discharge Occupational Therapy Recommendations: Supervision needed for safety with basic self-care routines, Supervision needed for safety with functional transfers, Would benefit from assistance with IADLs/ADLs, 3x weekly, Low intensity  IADL/ADLS Needing Assistance: Meal Preparation, Housekeeping, Tub/shower transfers    Equipment Recommendation OT DME Recommendations: None (recommend use of pt's rollator/rolling walker at home)     OT Treatment Diagnosis: Generalized muscle weakness, Reduced mobility, Unsteadiness on feet      Current therapy recommendation: Resume HHOT, use of rollator/rolling walker   Assessment Problem List: Decreased safety awareness, Decreased strength, Decreased coordination, Decreased activity tolerance, Impaired balance, Impaired fine motor skills, Decreased endurance, Shortness of breath, Decreased mobility, Gait deviation, Fall risk, Impaired ADLs Personal Factors/Comorbidities (Occupational Profile and History Review): Expanded (Moderate) Assessment of Occupational Performance : Balance, Cognitive skills, Dexterity, Endurance, Fine or gross motor coordination, Mobility, Psychosocial skills, Sensation Clinical Decision Making: Low Complexity  Assessment: Pt is a very pleasant 78 year old male who is well known to this hospital system and currently presents with medical dx of COPD exacerbation. Pt was found to be close to his baseline level of functioning this morning, including on his baseline level of supplemental oxygen  (2L). Pt was able to ambulate >100 feet with FWW and SBA. Mod I for other transfers just requiring increased time due to decreased endurance. Pt reports that he is already being followed by [facility] therapy services and is agreeable to resume with them at discharge. Also of note,  pt continues to report having 24/7 support/supervision from his son at home, making this a safe discharge disposition. Pt has all the necessary equipment to promote his independence already available at home. At close of session, pt was agreeable to sit up in a recliner chair, BLE elevated, all needs met, all lines intact, call bell in reach, and nurse [facility] aware. Thank you for this consult.  Today's Interventions: Balance activities, Bed mobility, Conservation, Education - Patient, Endurance activities, Functional mobility, Postular / Proximal stability, Positioning, Safety education, Transfer training, Other Today's Interventions: Initial OT evaluation. Therapeutic activity - pt edu on safe hand placement during STS transfers, pt edu on safe techniques with FWW, functional transfers and mobility, PLB techniques, energy conservation strategies.  Activity Tolerance During Today's Session Tolerated treatment well, Limited by fatigue, Other (Dyspnea on exertion)  Plan Planned Frequency of Treatment: Plan of Care Initiated: 04/27/24 1-2x per day Weekly Frequency: 1-3 days per week Planned Treatment Duration: 05/11/24  Planned Interventions:  Education (Patient/Family/Caregiver), Home Exercise Program, Self-Care/Home Training, Therapeutic Exercise, Therapeutic Activity    GOALS:  Patient and Family Goals: To continue to feel better and return home.  Short Term:  SHORT GOAL #1: Pt will be mod I for all aspects of toileting using AD for support as needed 3/3 trials.  Time Frame : 2 weeks SHORT GOAL #2: Pt will be mod I for LB ADLs with education in adaptive techniques to conserve energy 3/3 trials.  Time Frame : 2 weeks SHORT GOAL #3: Pt will be mod I for grooming ADLs in stance at the sink x5 minutes with no c/o fatigue 3/3 trials.  Time Frame : 2 weeks SHORT GOAL #4: Pt will increase BUE strength by 1 mm grade for improved ADLs, transfers, and mobility.  Time Frame : 2 weeks  Long Term  Goal #1: Pt will  increase functional endurance to 10 minutes in stance for participation in ADLs and light IADLs s/p education in energy conservation and fall prevention strategies 3/3 trials. Time Frame: 4 weeks  Prognosis:  Good Positive Indicators:  PLOF, support of son. Barriers to Discharge: Endurance deficits, Functional strength deficits, Gait instability, Impaired Balance, Inability to safely perform ADLS  Subjective Medical Updates Since Last Visit/Relevant PMH Affecting Clinical Decision Making:   Prior Functional Status Pt reports living in a motor home with his son (in backyard of wife's house), using FWW/rollator/motorized scooter for mobility, independent with ADLs, and requiring assistance from son with IADLs.  Living Situation Living Environment: Other (Pt reports that he lives in a motor home in his wife's yard.) Lives With: Son Home Living: Stairs to enter with rails, One level home, Walk-in shower Rail placement (outside): Rail on right side Number of Stairs to Enter (outside): 2 Caregiver Identified?: Yes (Pt's son) Caregiver Availability: 24 hours Caregiver Ability:  (Unknown)   Equipment available at home: Rolling walker, Rollator, Oxygen  (Motorized scooter. 2L supplemental oxygen  worn at baseline.)    Services patient receives prior to admission: OT, PT (HH PT, OT, RN prior to admission)   Patient / Caregiver reports: Pt reports already feeling better than he did yesterday.   Past Medical History[1] Social History   Tobacco Use  . Smoking status: Former    Passive exposure: Never  . Smokeless tobacco: Never  Substance Use Topics  . Alcohol use: Not Currently    Comment: OCCASIONAL    Past Surgical History[2] Family History[3]   Codeine   Objective Findings Precautions / Restrictions  Falls precautions     Weight Bearing  Non-applicable  Required Braces or Orthoses  Non-applicable  Communication Preference  Verbal     Pain  No c/o pain at rest  or with activity.  Equipment / Environment  Vascular access (PIV, TLC, Port-a-cath, PICC), Purewick/Condom catheter, Supplemental oxygen  (2L supplemental oxygen  at rest and with activity)     Cognition  Orientation Level:  Oriented x 4  Arousal/Alertness:  Appropriate responses to stimuli  Attention Span:  Appears intact  Memory:  Appears intact  Following Commands:  Follows all commands and directions without difficulty  Safety Judgment:  Decreased awareness of need for safety  Awareness of Errors and Problem Solving:  Assistance required to identify errors made, Assistance required to generate solutions, Assistance required to implement solutions  Comments: Ex. cues to maintain appropriate base of support within the walker frame.  Vision / Hearing  Vision: Glasses not present Vision Comments: Pt reports wearing glasses as needed and potential for cataract surgery in the near future. Hearing: No deficit identified      Hand Function: Right Hand Function: Right hand function impaired Right Hand Impairment: grip strength good, coordination impaired, serial opposition impaired Left Hand Function: Left hand function impaired Left Hand Impairment: grip strength good, coordination impaired, serial opposition impaired Hand Dominance: Right  Skin Inspection: Skin Inspection: Intact where visualized  Face/Cervical ROM:    ROM / Strength: UE ROM/Strength: Left Impaired/Limited, Right Impaired/Limited RUE Impairment: Reduced strength LUE Impairment: Reduced strength UE ROM/ Strength Comment: BUE 4-/5 on MMT. LE ROM/ Strength Comment: Please defer to PT evaluation note from this date for details.  Coordination: Coordination: Impaired, Decreased accuracy, Decreased speed Coordination comment: w/ serial opposition for assessment of [facility] skills.  Sensation: Sensory/ Proprioception/ Stereognosis comments: Not tested.  Balance: Static Sitting-Level of Assistance: Independent Dynamic  Sitting-Level of Assistance: Independent  Static Standing-Level of  Assistance: Stand by assistance Dynamic Standing - Level of Assistance: Stand by assistance Standing Balance comments: w/ FWW for support.  Functional Mobility Transfers: Standby assist Bed Mobility - Needs Assistance: Modified Independent Ambulation: x110 feet with FWW and SBA. Cues to maintain proper base of support within the frame of the walker to improve balance and prevent falls. Several standing rest breaks due to c/o SOB.  ADLs ADLs: Standby Assist IADLs: Pt reports having assistance from his son at baseline.  Vitals / Orthostatics Vitals/Orthostatics: Oxygen  saturation noted to be 95% or higher on 2L supplemental oxygen  while ambulating in the hallway.  Patient at end of session: All needs in reach, In chair, Lines intact, Notified Nurse   Occupational Therapy Session Duration OT Individual [mins]: 16 OT Co-Treatment [mins]: 32 Reason for Co-treatment: Poor activity tolerance     AM-PAC-Daily Activity                 Score (in points): % of Functional Impairment, Limitation, Restriction 6: 100% impaired, limited, restricted 7-8: At least 80%, but less than 100% impaired, limited restricted 9-13: At least 60%, but less than 80% impaired, limited restricted 14-19: At least 40%, but less than 60% impaired, limited restricted 20-22: At least 20%, but less than 40% impaired, limited restricted 23: At least 1%, but less than 20% impaired, limited restricted 24: 0% impaired, limited restricted   I attest that I have reviewed the above information. Signed: Vernell FORBES Bound, OT Filed 04/27/2024          [1] Past Medical History: Diagnosis Date  . Asbestosis      . COPD (chronic obstructive pulmonary disease)      . Hyperlipidemia   . Hypertension 02/06/2018   Formatting of this note might be different from the original. 2010-prescribed Lisinopril, non-complinant with medication  . Oxygen   dependent   . 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 HEMORRHIOD    . HERNIA REPAIR    . VASECTOMY    [3] History reviewed. No pertinent family history.

## 2024-04-27 NOTE — Consults (Signed)
 PHYSICAL THERAPY Evaluation (04/27/24 1409)      Patient Name:  Timothy [name]      Medical Record Number: [id number]  Date of Birth: Sep 19, 1946 Sex: Male      Post-Discharge Physical Therapy Recommendations: PT Post Acute Discharge Recommendations: Skilled PT services indicated, 3x weekly, Low intensity  Equipment Recommendation PT DME Recommendations: None (Recommend use of pt's RW at home.)        Treatment Diagnosis: Abnormalities of gait and mobility, Generalized muscle weakness, Unsteadiness on feet     Activity Tolerance: Tolerated treatment well, Limited by fatigue  Current therapy recommendation: HHPT    ASSESSMENT Problem List: Decreased safety awareness, Decreased strength, Impaired balance, Decreased endurance, Shortness of breath, Decreased mobility, Gait deviation, Fall risk, Impaired ADLs    Assessment : Patient is a 78 year old male with a medical dx of COPD with acute exacerbation. At baseline, pt lives with his son in motor home, uses FWW/rollator/motorized scooter for mobility, independent with ADLs, requires assist with IADLs. Denies falls. On this date, pt able to perform bed mobility at the Mod I level, transfer STS with SBA, and ambulate 110 feet with FWW and SBA with several standing rest breaks due to shortness of breath. Pt on 2L supplemental O2 at rest and with activity, O2 sat 95% with exertion. Pt presents close to his baseline. Pt will benefit from skilled PT services in the acute care setting and from resuming HHPT upon discharge to improve safety and functional endurance. Upon completion of session, pt seated in recliner chair, call bell in reach, all lines in place, all needs met, and nurse aware.    Today's Interventions: Gait training, Therapeutic activity Today's Interventions: PT initial evaluation.   Personal Factors/Comorbidities Present: 1-2 (PMH, fall risk)  Examination of Body System: Musculoskeletal, Cardiovascular, Pulmonary,  Neurological, Integumentary, Communication, Activity/participation Clinical Presentation: Stable   Clinical Decision Making: Low     PLAN Planned Frequency of Treatment: Plan of Care Initiated: 04/27/24 1-2x per day Weekly Frequency: 1-3 days per week Planned Treatment Duration: 05/11/24   Planned Interventions: Education (Patient/Family/Caregiver), Gait training, Home exercise program, Therapeutic Exercise, Therapeutic Activity   Goals:  Patient and Family Goals: To return home.   SHORT GOAL #1: Patient will demonstrate independence with all aspects of bed mobility.              Time Frame : 2 weeks SHORT GOAL #2: Patient will demonstrate Mod I with functional transfers with use of least restrictive assistive device.             Time Frame : 2 weeks SHORT GOAL #3: Patient will ambulate 200 feet with Mod I with use of least restrictive assistive device.             Time Frame : 2 weeks                   Long Term Goal #1: Patient will ambulate 300 feet with Mod I with use of least restrictive assistive device. Time Frame: 4 weeks   Prognosis:  Good Positive Indicators: PLOF, support of son Barriers to Discharge: Endurance deficits, Functional strength deficits, Gait instability, Impaired Balance, Inability to safely perform ADLS   SUBJECTIVE Communication Preference: Verbal    Patient reports: Patient agreeable to PT initial evaluation. Pain Comments: No c/o pain.     Prior Functional Status: Pt reports he lives with his son in motor home, uses FWW/rollator/motorized scooter for mobility, independent with ADLs, requires assist with  IADLs. Denies falls. Living Situation Living Environment: Other (Pt reports that he lives in a motor home in his wife's yard.) Lives With: Son Home Living: Stairs to enter with rails, One level home, Walk-in shower Rail placement (outside): Rail on right side Number of Stairs to Enter (outside): 2 Caregiver Identified?: Yes (Pt's son) Caregiver  Ability:  (Unknown) Caregiver Identified?: Yes (Pt's son)  Equipment available at home: Rolling walker, Rollator, Oxygen  (Motorized scooter. 2L supplemental oxygen  worn at baseline.)     Past Medical History[1]        Social History   Tobacco Use  . Smoking status: Former    Passive exposure: Never  . Smokeless tobacco: Never  Substance Use Topics  . Alcohol use: Not Currently    Comment: OCCASIONAL     Past Surgical History[2]         Family History[3]   Allergies: Codeine            Objective Findings Precautions / Restrictions Precautions: Falls precautions Weight Bearing Status: Non-applicable Required Braces or Orthoses: Non-applicable     Equipment / Environment: Vascular access (PIV, TLC, Port-a-cath, PICC), Purewick/Condom catheter, Supplemental oxygen  (2L supplemental oxygen  at rest and with activity)       Cognition: WFL Orientation: Oriented x4 Visual/Perception: Wears glasses for reading only Hearing: No deficit identified   Skin Inspection: Intact where visualized   Upper Extremities UE comment: Will defer official strength testing and range of motion of upper extremities to OT.  Lower Extremities LE ROM: Right WFL, Left WFL LE Strength: Right Impaired/Limited, Left Impaired/Limited RLE Strength Impairment: Reduced strength LLE Strength Impairment: Reduced strength LE comment: 4/5 strength grossly throughout bilateral lower extremities.      Sensation: Not tested Posture: Impaired, Forward head, Rounded shoulders  Dynamic Sitting-Level of Assistance: Independent  Static Standing-Level of Assistance: Stand by assistance Dynamic Standing - Level of Assistance: Stand by assistance Standing Balance comments: With use of FWW.    Bed Mobility: Supine to Sit Supine to Sit assistance level: Modified independent, requires aide device or extra time Bed Mobility comments: Performed supine>sit at the Mod I level, HOB elevated.   Transfers: Sit to  Stand Sit to Stand assistance level: Standby assist, set-up cues, supervision of patient - no hands on Transfer comments: STS from bed with SBA using FWW.    Gait Level of Assistance: Standby assist, set-up cues, supervision of patient - no hands on Gait Assistive Device: Rolling walker Gait Distance Ambulated (ft): 110 ft Skilled Treatment Performed: Pt ambulated 110 feet with FWW and SBA with several standing rest breaks d/t shortness of breath. Cues for appropriate device proximity, upright posture, and pacing.   Stairs: NT    Wheelchair Mobility: NT   Endurance: Impaired  Patient at end of session: Lines intact, Notified Nurse, In chair, All needs in reach  Physical Therapy Session Duration PT Individual [mins]: 16 PT Co-Treatment [mins]: 32 Reason for Co-treatment: Poor activity tolerance      AM-PAC 5 click                Score (in points): % of Functional Impairment, Limitation, Restriction 5: 100% impaired, limited, restricted 6-7: At least 80%, but less than 100% impaired, limited restricted 8-11: At least 60%, but less than 80% impaired, limited restricted 12-16: At least 40%, but less than 60% impaired, limited restricted 17-18: At least 20%, but less than 40% impaired, limited restricted 19: At least 1%, but less than 20% impaired, limited restricted 20: 0% impaired,  limited restricted  'AM-PAC' forms are Copyright protected by [employer] [facility]     I attest that I have reviewed the above information. Signed: Toribio Dao, PT Filed 04/27/2024       [1] Past Medical History: Diagnosis Date  . Asbestosis      . COPD (chronic obstructive pulmonary disease)      . Hyperlipidemia   . Hypertension 02/06/2018   Formatting of this note might be different from the original. 2010-prescribed Lisinopril, non-complinant with medication  . Oxygen  dependent   . TIA (transient ischemic attack)    Patient recently had an MRI that showed three  acute or subacute infarctions in the left cerebral hemisphere  [2] Past Surgical History: Procedure Laterality Date  . HEMORRHIOD    . HERNIA REPAIR    . VASECTOMY    [3] History reviewed. No pertinent family history.

## 2024-04-27 NOTE — Progress Notes (Signed)
 Hospitalist Progress Note 04/27/24  LOS: 1 day       Patient name: Timothy [name] DOB [birth year] MRN#: [id number] PCP: Rosamond Leta NOVAK, MD Date: 04/27/24 Time: 10:53 AM Primary Care Provider:  Rosamond Leta NOVAK, MD Inpatient primary attending provider: Joana Marice Hurst, PA   Timeline of Significant Events: No notes on file  _____________________________________  Admission HPI   Patient admitted on: 04/26/2024  3:33 PM  Patient admitted by: Casimiro Charlyne Seidel, MD  CHIEF COMPLAINT: Short of breath   Day of admission HPI:    History obtained from patient who states that he was doing well today until approximately 1:00 this afternoon when he was feeling short of breath and was not getting any relief from his nebulizer machine.   Per ER notes, EMS noted that his nebulizer machine was not working properly.  Patient confirms that his nebulizer machine is new but is not producing the same nebulization that used to recently.   Patient does not have any cough or cold or fever.  No change in appetite.  No nausea vomiting diarrhea.  No abdominal pain.   Patient has a history of COPD exacerbation and just was here on June 24 admitted for same.  He was discharged on June 25.   However, today, during my H&P, he is speaking with pursed lips.  Normally he is on 2 L of oxygen  at home but currently on 3 L here.  He is using his accessory muscles in his chest and neck to breathe and speaking in brief or sentences to catch his breath.  He does appear in acute respiratory distress during my H&P.   His BP is 85/58 with a MAP of 67.   His heart rate is 114, afebrile.  His lactate is elevated at 2.4. His glucose is elevated at 196.  White count is normal at 9.8.   Rapid flu/RSV/COVID PCR are all negative.   Chest x-ray shows patchy bibasilar opacities. He was recently discharged on antibiotics which he states that he is taking.  It could be outpatient treatment failure and we will change his  antibiotics to levofloxacin (as well as oral doxycycline to cover for atypicals.)   Patient admitted on Home O2? - yes Patient on home anticoagulant? -  yes Patient admitted with Chronic home foley catheter? - no Foley catheter placed or replaced by another service prior to admission? - no   Mental Status on Admission: The patient is Alert and oriented to PERSON The patient is Alert And oriented to TIME The patient is Alert and oriented to LOCATION   Problem List, Assessment & Plan    ASSESSMENT & PLAN (In order of descending acuity)  Principal Problem:   COPD with acute exacerbation    Active Problems:   Pneumonia of both lower lobes due to infectious organism   High serum lactate   Pursed-lip breathing     COPD with acute exacerbation   //  pneumonia of both lower lobes due to infectious organism//pursed-lip breathing -Supplemental oxygen  - Methylprednisolone  40 mg every 6 hours x 8 doses - IV levofloxacin 750 mg daily x 7 days with first dose initiated in ED - Doxycycline 100 mg twice daily x 7 days     High serum lactate -IV fluids of normal saline 100 cc/h x 12 hours - Recheck lactate at 8:00 tonight - Recheck lactate at 6:00 in the morning if still high at 8:00 at night -April 27, 2024 => third lactate reading at 5:30  AM = 0 0.8 = normal  ADDITIONAL PATIENT FINDINGS OR OBSERVATIONS  During morning rounds, patient continues to have pursed lip breathing after PT OT worked with him.  He is using his accessory muscles and is in a tripod stance on his bed sitting at the edge.  He is speaking in short sentences because he is trying to catch his breath.  He was placed on 4 L by OT/PT at 9:30 AM after walking him up and down the halls.  He has been weaned down to 2 L about half an hour later.  He normally wears 2 L at home.  Patient is not a safe discharge today due to having ongoing symptoms of accessory muscle use, pursed lip breathing and tripod stance.  He desaturates with any  exertion.  Patient would benefit from further care today and reevaluation tomorrow.  Continue levofloxacin 750 mg IV x 7 days Continue doxycycline 100 mg p.o. twice daily x 7 days Continue methylprednisolone  40 mg every 6 hours x 8 doses Continue DuoNeb  Patient's nebulizer machine is malfunctioning and case management has discussed with [facility] [employer] for getting him a new machine.   DVT prophylaxis while in hospital:  Eliquis 5 mg twice a day _____________________________________  Discharge estimated within 1-2 days. Anticipated disposition:  To Home  Nutrition: Nutrition Therapy Heart Healthy; Consistent Carb 60/60/60 (4/4/4); Fluid 2000 ml     CODE STATUS :                    DNR and DNI  ______________________________________________________________________  Vital signs in last 24 hours: Temp:  [36.3 C (97.3 F)-36.7 C (98.1 F)] 36.4 C (97.5 F) Pulse:  [73-114] 99 SpO2 Pulse:  [84-115] 84 Resp:  [17-26] 20 BP: (85-134)/(50-86) 123/50 MAP (mmHg):  [67-98] 71 FiO2 (%):  [28 %-32 %] 28 % SpO2:  [92 %-99 %] 94 %  Intake/Output last 3 shifts: I/O last 3 completed shifts: In: -  Out: 750 [Urine:750]   Consults  IP CONSULT TO HOSPITALIST   Allergies  Allergies[1]    Past Medical History  Past Medical History[2]     MEDS Scheduled Meds:Scheduled Medications[3] Continuous Infusions:Infusions Meds[4] PRN Meds:.PRN Medications[5]   Recent Lab Results   Lab Results  Component Value Date   WBC 10.2 04/27/2024   HGB 12.2 (L) 04/27/2024   HCT 37.4 04/27/2024   PLT 186 04/27/2024    Lab Results  Component Value Date   NA 140 04/27/2024   K 4.3 04/27/2024   CL 108 (H) 04/27/2024   CO2 25.7 04/27/2024   BUN 17 04/27/2024   CREATININE 0.71 (L) 04/27/2024   GLU 125 04/27/2024   CALCIUM  8.3 (L) 04/27/2024   MG 2.0 08/21/2023    Lab Results  Component Value Date   BILITOT 0.4 04/27/2024   PROT 5.5 (L) 04/27/2024   ALBUMIN 2.6 (L) 04/27/2024   ALT  24 04/27/2024   AST 14 (L) 04/27/2024   ALKPHOS 56 04/27/2024    Lab Results  Component Value Date   PT 11.0 04/19/2024   INR 0.99 04/19/2024   APTT 61.6 (H) 03/21/2024    Imaging  ECG 12 Lead Result Date: 04/26/2024 Sinus tachycardia Nonspecific intraventricular block T wave abnormality, consider lateral ischemia Abnormal ECG When compared with ECG of 19-Apr-2024 22:58, No significant change was found Confirmed by Cherie Searle ([id number]) on 04/26/2024 4:35:26 PM  XR Chest Portable Result Date: 04/26/2024 Exam:  Portable Chest  History:  Dyspnea  Technique:  Single  frontal view.  Comparison:  Chest radiograph dated 04/19/2024 and prior.  Findings:    Lungs are well-inflated. No pulmonary edema. Patchy bibasilar opacities, similar to prior exam. Severe upper lobe predominant emphysematous change.  No pleural effusion. No pneumothorax.  Unremarkable cardiomediastinal silhouette.         -- Patchy bibasilar opacities, similar to prior exam. Likely atelectasis, although developing infectious process is difficult to entirely exclude.  Signed (Electronic Signature): 04/26/2024 4:15 PM Signed By: Alyce Bellman, MD   Joana JONETTA Hurst, PA Hospitalist, [facility] 04/27/24, 10:53 AM     [1] Allergies Allergen Reactions  . Codeine Itching  [2] Past Medical History: Diagnosis Date  . Asbestosis      . COPD (chronic obstructive pulmonary disease)      . Hyperlipidemia   . Hypertension 02/06/2018   Formatting of this note might be different from the original. 2010-prescribed Lisinopril, non-complinant with medication  . Oxygen  dependent   . 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 amlodipine  5 mg Oral Daily  . apixaban  5 mg Oral BID  . atorvastatin   40 mg Oral Daily  . citalopram  40 mg Oral Daily  . doxycycline  100 mg Oral BID  . insulin  lispro  0-20 Units Subcutaneous ACHS  .  ipratropium-albuterol   3 mL Nebulization Once  . ipratropium-albuterol   3 mL Nebulization Q6H (RT)  . levoFLOXacin  750 mg Intravenous Q24H  . lisinopril  10 mg Oral Daily  . methylPREDNISolone  sodium succinate  125 mg Intravenous Once  . methylPREDNISolone  sodium succinate  40 mg Intravenous Q6H  . risperiDONE  1 mg Oral BID  [4] [5] acetaminophen , acetaminophen , dextrose , dextrose  in water, docusate sodium, glucagon, guaiFENesin , hydrALAZINE, ketorolac , melatonin, ondansetron , phenol

## 2024-04-27 NOTE — Consults (Signed)
 Care Management Initial Transition Planning Assessment            General Care Manager / Social Worker assessed the patient by : In person interview with patient Orientation Level: Oriented X4 Functional level prior to admission: Partially Assisted Who provides care at home?: Family member Level of assistance required: Walking Reason for referral: Discharge Planning, Home Health  Contact/Decision Maker Extended Emergency Contact Information Primary Emergency Contact: Moat,Marcia Address: [address]          [place], [place] [address] Home Phone: [phone] Mobile Phone: [phone] Relation: Spouse  Legal Next of Kin / Guardian / POA / Advance Directives    HCDM (patient stated preference): Timothy [name], Timothy [name] - Spouse - [phone]  Advance Directive (Medical Treatment) Does patient have an advance directive covering medical treatment?: Patient has advance directive covering medical treatment, copy in chart.  Health Care Decision Maker [HCDM] (Medical & Mental Health Treatment) Healthcare Decision Maker: HCDM documented in the HCDM/Contact Info section. Information offered on HCDM, Medical & Mental Health advance directives:: Patient declined information.  Advance Directive (Mental Health Treatment) Does patient have an advance directive covering mental health treatment?: Patient does not have advance directive covering mental health treatment. Reason patient does not have an advance directive covering mental health treatment:: HCDM documented in the HCDM/Contact Info section., Patient does not wish to complete one at this time.  Readmission Information  Have you been hospitalized in the last 30 days?: Yes Name of Hospital: [facility] Were you being cared for at a skilled nursing facility:: No   What day were you discharged from that hospital or facility?: 04/21/24 Number of Days between previous discharge and readmission date: 4-7 days  Type of Readmission:  Related to Previous Admission  Readmission Source: Home    Did the following happen with your discharge?  Did you receive a follow-up/transition call?: No   Did you get your discharge medications?  : Yes    Did you go to your scheduled follow up appointment with your doctor on discharge?: No  If no why?: No appointment made Which services or equipment arranged after your discharge arrived?: Equipment, Home Health  Did you understand your discharge instructions?: Yes    Contributing Factors for Readmission: Failed outpatient treatment, Other (comment) (Nebulizer stopped working- ordered one for him)       Patient Information Lives with:    Type of Residence:          Support Systems/Concerns: Case Research officer, political party, Children, Concern for Caregiver Burnout  Responsibilities/Dependents at home?: N/A  Home Care services in place prior to admission?: Yes Type of Home Care services in place prior to admission: Home OT, Home PT, Palliative, DME or oxygen  Current Home Care provider (Name/Phone #): [facility]  Outpatient/Community Resources in place prior to admission: Clinic Agency detail (Name/Phone #): [facility]; [facility] and [facility]  Equipment Currently Used at Home: respiratory supplies, oxygen , walker, rolling, commode chair, shower chair Current HME Agency (Name/Phone #): [facility]  Currently receiving outpatient dialysis?: N/A    Financial Information    Need for financial assistance?: N/A    Social Determinants of Health Social Determinants of Health were addressed in provider documentation.  Please refer to patient history. Social Drivers of Health   Food Insecurity: No Food Insecurity (04/20/2024)   Hunger Vital Sign   . Worried About Programme researcher, broadcasting/film/video in the Last Year: Never true   . Ran Out of Food in the Last  Year: Never true  Tobacco Use: Medium Risk (04/26/2024)   Patient History   . Smoking  Tobacco Use: Former   . Smokeless Tobacco Use: Never   . Passive Exposure: Never  Transportation Needs: No Transportation Needs (04/20/2024)   PRAPARE - Transportation   . Lack of Transportation (Medical): No   . Lack of Transportation (Non-Medical): No  Alcohol Use: Not At Risk (04/20/2024)   Alcohol Use   . How often do you have a drink containing alcohol?: Never   . How many drinks containing alcohol do you have on a typical day when you are drinking?: 1 - 2   . How often do you have 5 or more drinks on one occasion?: Never  Housing: Low Risk  (04/20/2024)   Housing   . Within the past 12 months, have you ever stayed: outside, in a car, in a tent, in an overnight shelter, or temporarily in someone else's home (i.e. couch-surfing)?: No   . Are you worried about losing your housing?: No  Physical Activity: Inactive (04/20/2024)   Exercise Vital Sign   . Days of Exercise per Week: 0 days   . Minutes of Exercise per Session: 0 min  Utilities: Low Risk  (04/20/2024)   Utilities   . Within the past 12 months, have you been unable to get utilities (heat, electricity) when it was really needed?: No  Stress: No Stress Concern Present (04/20/2024)   [employer] of Occupational Health - Occupational Stress Questionnaire   . Feeling of Stress: Not at all  Interpersonal Safety: Not At Risk (04/20/2024)   Interpersonal Safety   . Unsafe Where You Currently Live: No   . Physically Hurt by Anyone: No   . Abused by Anyone: No  Substance Use: Low Risk  (04/20/2024)   Substance Use   . In the past year, how often have you used prescription drugs for non-medical reasons?: Never   . In the past year, how often have you used illegal drugs?: Never   . In the past year, have you used any substance for non-medical reasons?: No  Intimate Partner Violence: Not At Risk (04/20/2024)   Humiliation, Afraid, Rape, and Kick questionnaire   . Fear of Current or Ex-Partner: No   . Emotionally Abused: No   .  Physically Abused: No   . Sexually Abused: No  Social Connections: Moderately Isolated (04/20/2024)   Social Connection and Isolation Panel   . Frequency of Communication with Friends and Family: More than three times a week   . Frequency of Social Gatherings with Friends and Family: Twice a week   . Attends Religious Services: 1 to 4 times per year   . Active Member of Clubs or Organizations: No   . Attends Banker Meetings: Never   . Marital Status: Separated  Physicist, medical Strain: Low Risk  (04/20/2024)   Overall Financial Resource Strain (CARDIA)   . Difficulty of Paying Living Expenses: Not hard at all  Health Literacy: High Risk (04/20/2024)   Health Literacy   . : Often  Internet Connectivity: No Internet connectivity concern identified (04/20/2024)   Internet Connectivity   . Do you have access to internet services: Yes   . How do you connect to the internet: Personal Device at home   . Is your internet connection strong enough for you to watch video on your device without major problems?: Yes   . Do you have enough data to get through the month?: Yes   .  Does at least one of the devices have a camera that you can use for video chat?: Yes    Complex Discharge Information  Is patient identified as a difficult/complex discharge?: No                                       Interventions:    Discharge Needs Assessment Concerns to be Addressed: discharge planning  Clinical Risk Factors: > 65, Multiple Diagnoses (Chronic), Functional Limitations, Readmission < 30 Days  Barriers to taking medications: N/A  Prior overnight hospital stay or ED visit in last 90 days: Yes        Anticipated Changes Related to Illness: inability to care for self  Equipment Needed After Discharge: respiratory supplies  Discharge Facility/Level of Care Needs:    Readmission Risk of Unplanned Readmission Score: UNPLANNED READMISSION SCORE:  22.67% Predictive Model Details        23%  Factor Value   Calculated 04/27/2024 08:08 18% Number of ED visits in last six months 5   [facility] Risk of Unplanned Readmission Model 18% Number of active inpatient medication orders 39    13% Active NSAID inpatient medication order present    6% Number of hospitalizations in last year 2    6% Active antipsychotic inpatient medication order present    6% ECG/EKG order present in last 6 months    6% Latest calcium  low (8.3 mg/dL)    4% Imaging order present in last 6 months    4% Age 78    4% Latest hemoglobin low (12.2 g/dL)    4% Charlson Comorbidity Index 5    3% Diagnosis of deficiency anemia present    3% Active anticoagulant inpatient medication order present    3% Active corticosteroid inpatient medication order present    1% Future appointment scheduled    0% Current length of stay 0.588 days    Readmitted Within the Last 30 Days? (No if blank) Yes Patient at risk for readmission?: Yes  Discharge Plan Screen findings are: Discharge planning needs identified or anticipated (Comment).  Expected Discharge Date:   Expected Transfer from Critical Care:    Quality data for continuing care services shared with patient and/or representative?: N/A Patient and/or family were provided with choice of facilities / services that are available and appropriate to meet post hospital care needs?: N/A    Initial Assessment complete?: Yes

## 2024-04-28 DIAGNOSIS — R7402 Elevation of levels of lactic acid dehydrogenase (LDH): Secondary | ICD-10-CM | POA: Diagnosis not present

## 2024-04-28 DIAGNOSIS — M545 Low back pain, unspecified: Secondary | ICD-10-CM | POA: Diagnosis not present

## 2024-04-28 DIAGNOSIS — K429 Umbilical hernia without obstruction or gangrene: Secondary | ICD-10-CM | POA: Diagnosis not present

## 2024-04-28 DIAGNOSIS — K4090 Unilateral inguinal hernia, without obstruction or gangrene, not specified as recurrent: Secondary | ICD-10-CM | POA: Diagnosis not present

## 2024-04-28 DIAGNOSIS — Z79899 Other long term (current) drug therapy: Secondary | ICD-10-CM | POA: Diagnosis not present

## 2024-04-28 DIAGNOSIS — R918 Other nonspecific abnormal finding of lung field: Secondary | ICD-10-CM | POA: Diagnosis not present

## 2024-04-28 DIAGNOSIS — R0602 Shortness of breath: Secondary | ICD-10-CM | POA: Diagnosis not present

## 2024-04-28 DIAGNOSIS — J439 Emphysema, unspecified: Secondary | ICD-10-CM | POA: Diagnosis not present

## 2024-04-28 DIAGNOSIS — J441 Chronic obstructive pulmonary disease with (acute) exacerbation: Secondary | ICD-10-CM | POA: Diagnosis not present

## 2024-04-28 DIAGNOSIS — G319 Degenerative disease of nervous system, unspecified: Secondary | ICD-10-CM | POA: Diagnosis not present

## 2024-05-02 DIAGNOSIS — I110 Hypertensive heart disease with heart failure: Secondary | ICD-10-CM | POA: Diagnosis not present

## 2024-05-02 DIAGNOSIS — Z9981 Dependence on supplemental oxygen: Secondary | ICD-10-CM | POA: Diagnosis not present

## 2024-05-02 DIAGNOSIS — J449 Chronic obstructive pulmonary disease, unspecified: Secondary | ICD-10-CM | POA: Diagnosis not present

## 2024-05-02 DIAGNOSIS — J61 Pneumoconiosis due to asbestos and other mineral fibers: Secondary | ICD-10-CM | POA: Diagnosis not present

## 2024-05-02 DIAGNOSIS — E785 Hyperlipidemia, unspecified: Secondary | ICD-10-CM | POA: Diagnosis not present

## 2024-05-02 DIAGNOSIS — Z7902 Long term (current) use of antithrombotics/antiplatelets: Secondary | ICD-10-CM | POA: Diagnosis not present

## 2024-05-02 DIAGNOSIS — Z7951 Long term (current) use of inhaled steroids: Secondary | ICD-10-CM | POA: Diagnosis not present

## 2024-05-02 DIAGNOSIS — J9611 Chronic respiratory failure with hypoxia: Secondary | ICD-10-CM | POA: Diagnosis not present

## 2024-05-02 DIAGNOSIS — K635 Polyp of colon: Secondary | ICD-10-CM | POA: Diagnosis not present

## 2024-05-02 DIAGNOSIS — R531 Weakness: Secondary | ICD-10-CM | POA: Diagnosis not present

## 2024-05-02 DIAGNOSIS — Z556 Problems related to health literacy: Secondary | ICD-10-CM | POA: Diagnosis not present

## 2024-05-02 DIAGNOSIS — I5032 Chronic diastolic (congestive) heart failure: Secondary | ICD-10-CM | POA: Diagnosis not present

## 2024-05-02 DIAGNOSIS — I69398 Other sequelae of cerebral infarction: Secondary | ICD-10-CM | POA: Diagnosis not present

## 2024-05-02 DIAGNOSIS — I69320 Aphasia following cerebral infarction: Secondary | ICD-10-CM | POA: Diagnosis not present

## 2024-05-03 DIAGNOSIS — I110 Hypertensive heart disease with heart failure: Secondary | ICD-10-CM | POA: Diagnosis not present

## 2024-05-03 DIAGNOSIS — I10 Essential (primary) hypertension: Secondary | ICD-10-CM | POA: Diagnosis not present

## 2024-05-03 DIAGNOSIS — Z7902 Long term (current) use of antithrombotics/antiplatelets: Secondary | ICD-10-CM | POA: Diagnosis not present

## 2024-05-03 DIAGNOSIS — R531 Weakness: Secondary | ICD-10-CM | POA: Diagnosis not present

## 2024-05-03 DIAGNOSIS — I5032 Chronic diastolic (congestive) heart failure: Secondary | ICD-10-CM | POA: Diagnosis not present

## 2024-05-03 DIAGNOSIS — J9611 Chronic respiratory failure with hypoxia: Secondary | ICD-10-CM | POA: Diagnosis not present

## 2024-05-03 DIAGNOSIS — Z7951 Long term (current) use of inhaled steroids: Secondary | ICD-10-CM | POA: Diagnosis not present

## 2024-05-03 DIAGNOSIS — J61 Pneumoconiosis due to asbestos and other mineral fibers: Secondary | ICD-10-CM | POA: Diagnosis not present

## 2024-05-03 DIAGNOSIS — I69398 Other sequelae of cerebral infarction: Secondary | ICD-10-CM | POA: Diagnosis not present

## 2024-05-03 DIAGNOSIS — Z556 Problems related to health literacy: Secondary | ICD-10-CM | POA: Diagnosis not present

## 2024-05-03 DIAGNOSIS — K635 Polyp of colon: Secondary | ICD-10-CM | POA: Diagnosis not present

## 2024-05-03 DIAGNOSIS — Z9981 Dependence on supplemental oxygen: Secondary | ICD-10-CM | POA: Diagnosis not present

## 2024-05-03 DIAGNOSIS — E785 Hyperlipidemia, unspecified: Secondary | ICD-10-CM | POA: Diagnosis not present

## 2024-05-03 DIAGNOSIS — G459 Transient cerebral ischemic attack, unspecified: Secondary | ICD-10-CM | POA: Diagnosis not present

## 2024-05-03 DIAGNOSIS — I69320 Aphasia following cerebral infarction: Secondary | ICD-10-CM | POA: Diagnosis not present

## 2024-05-03 DIAGNOSIS — J449 Chronic obstructive pulmonary disease, unspecified: Secondary | ICD-10-CM | POA: Diagnosis not present

## 2024-05-04 DIAGNOSIS — K635 Polyp of colon: Secondary | ICD-10-CM | POA: Diagnosis not present

## 2024-05-04 DIAGNOSIS — I5032 Chronic diastolic (congestive) heart failure: Secondary | ICD-10-CM | POA: Diagnosis not present

## 2024-05-04 DIAGNOSIS — J61 Pneumoconiosis due to asbestos and other mineral fibers: Secondary | ICD-10-CM | POA: Diagnosis not present

## 2024-05-04 DIAGNOSIS — I110 Hypertensive heart disease with heart failure: Secondary | ICD-10-CM | POA: Diagnosis not present

## 2024-05-04 DIAGNOSIS — I69320 Aphasia following cerebral infarction: Secondary | ICD-10-CM | POA: Diagnosis not present

## 2024-05-04 DIAGNOSIS — Z556 Problems related to health literacy: Secondary | ICD-10-CM | POA: Diagnosis not present

## 2024-05-04 DIAGNOSIS — Z7902 Long term (current) use of antithrombotics/antiplatelets: Secondary | ICD-10-CM | POA: Diagnosis not present

## 2024-05-04 DIAGNOSIS — Z09 Encounter for follow-up examination after completed treatment for conditions other than malignant neoplasm: Secondary | ICD-10-CM | POA: Diagnosis not present

## 2024-05-04 DIAGNOSIS — Z9981 Dependence on supplemental oxygen: Secondary | ICD-10-CM | POA: Diagnosis not present

## 2024-05-04 DIAGNOSIS — R531 Weakness: Secondary | ICD-10-CM | POA: Diagnosis not present

## 2024-05-04 DIAGNOSIS — E785 Hyperlipidemia, unspecified: Secondary | ICD-10-CM | POA: Diagnosis not present

## 2024-05-04 DIAGNOSIS — I10 Essential (primary) hypertension: Secondary | ICD-10-CM | POA: Diagnosis not present

## 2024-05-04 DIAGNOSIS — I69398 Other sequelae of cerebral infarction: Secondary | ICD-10-CM | POA: Diagnosis not present

## 2024-05-04 DIAGNOSIS — Z7951 Long term (current) use of inhaled steroids: Secondary | ICD-10-CM | POA: Diagnosis not present

## 2024-05-04 DIAGNOSIS — J449 Chronic obstructive pulmonary disease, unspecified: Secondary | ICD-10-CM | POA: Diagnosis not present

## 2024-05-04 DIAGNOSIS — J9611 Chronic respiratory failure with hypoxia: Secondary | ICD-10-CM | POA: Diagnosis not present

## 2024-05-09 DIAGNOSIS — Z8673 Personal history of transient ischemic attack (TIA), and cerebral infarction without residual deficits: Secondary | ICD-10-CM | POA: Diagnosis not present

## 2024-05-09 DIAGNOSIS — R918 Other nonspecific abnormal finding of lung field: Secondary | ICD-10-CM | POA: Diagnosis not present

## 2024-05-09 DIAGNOSIS — J441 Chronic obstructive pulmonary disease with (acute) exacerbation: Secondary | ICD-10-CM | POA: Diagnosis not present

## 2024-05-09 DIAGNOSIS — Z7902 Long term (current) use of antithrombotics/antiplatelets: Secondary | ICD-10-CM | POA: Diagnosis not present

## 2024-05-09 DIAGNOSIS — R4781 Slurred speech: Secondary | ICD-10-CM | POA: Diagnosis not present

## 2024-05-09 DIAGNOSIS — I10 Essential (primary) hypertension: Secondary | ICD-10-CM | POA: Diagnosis not present

## 2024-05-09 DIAGNOSIS — Z87891 Personal history of nicotine dependence: Secondary | ICD-10-CM | POA: Diagnosis not present

## 2024-05-09 DIAGNOSIS — J9621 Acute and chronic respiratory failure with hypoxia: Secondary | ICD-10-CM | POA: Diagnosis not present

## 2024-05-09 DIAGNOSIS — E785 Hyperlipidemia, unspecified: Secondary | ICD-10-CM | POA: Diagnosis not present

## 2024-05-09 DIAGNOSIS — J9611 Chronic respiratory failure with hypoxia: Secondary | ICD-10-CM | POA: Diagnosis not present

## 2024-05-09 DIAGNOSIS — R4702 Dysphasia: Secondary | ICD-10-CM | POA: Diagnosis not present

## 2024-05-09 DIAGNOSIS — Z7901 Long term (current) use of anticoagulants: Secondary | ICD-10-CM | POA: Diagnosis not present

## 2024-05-09 DIAGNOSIS — Z885 Allergy status to narcotic agent status: Secondary | ICD-10-CM | POA: Diagnosis not present

## 2024-05-09 DIAGNOSIS — R0600 Dyspnea, unspecified: Secondary | ICD-10-CM | POA: Diagnosis not present

## 2024-05-09 DIAGNOSIS — R0602 Shortness of breath: Secondary | ICD-10-CM | POA: Diagnosis not present

## 2024-05-09 DIAGNOSIS — Z66 Do not resuscitate: Secondary | ICD-10-CM | POA: Diagnosis not present

## 2024-05-09 DIAGNOSIS — R609 Edema, unspecified: Secondary | ICD-10-CM | POA: Diagnosis not present

## 2024-05-09 DIAGNOSIS — Z79899 Other long term (current) drug therapy: Secondary | ICD-10-CM | POA: Diagnosis not present

## 2024-05-09 DIAGNOSIS — Z9981 Dependence on supplemental oxygen: Secondary | ICD-10-CM | POA: Diagnosis not present

## 2024-05-09 DIAGNOSIS — I480 Paroxysmal atrial fibrillation: Secondary | ICD-10-CM | POA: Diagnosis not present

## 2024-05-09 DIAGNOSIS — Z91120 Patient's intentional underdosing of medication regimen due to financial hardship: Secondary | ICD-10-CM | POA: Diagnosis not present

## 2024-05-09 DIAGNOSIS — D696 Thrombocytopenia, unspecified: Secondary | ICD-10-CM | POA: Diagnosis not present

## 2024-05-09 DIAGNOSIS — T45516A Underdosing of anticoagulants, initial encounter: Secondary | ICD-10-CM | POA: Diagnosis not present

## 2024-05-09 DIAGNOSIS — J61 Pneumoconiosis due to asbestos and other mineral fibers: Secondary | ICD-10-CM | POA: Diagnosis not present

## 2024-05-09 DIAGNOSIS — R0689 Other abnormalities of breathing: Secondary | ICD-10-CM | POA: Diagnosis not present

## 2024-05-10 ENCOUNTER — Telehealth: Payer: Self-pay | Admitting: Pharmacist

## 2024-05-10 DIAGNOSIS — I63511 Cerebral infarction due to unspecified occlusion or stenosis of right middle cerebral artery: Secondary | ICD-10-CM | POA: Diagnosis not present

## 2024-05-10 DIAGNOSIS — G319 Degenerative disease of nervous system, unspecified: Secondary | ICD-10-CM | POA: Diagnosis not present

## 2024-05-10 DIAGNOSIS — I6782 Cerebral ischemia: Secondary | ICD-10-CM | POA: Diagnosis not present

## 2024-05-10 DIAGNOSIS — I480 Paroxysmal atrial fibrillation: Secondary | ICD-10-CM | POA: Diagnosis not present

## 2024-05-10 DIAGNOSIS — J441 Chronic obstructive pulmonary disease with (acute) exacerbation: Secondary | ICD-10-CM | POA: Diagnosis not present

## 2024-05-10 DIAGNOSIS — D696 Thrombocytopenia, unspecified: Secondary | ICD-10-CM | POA: Diagnosis not present

## 2024-05-10 DIAGNOSIS — Z79899 Other long term (current) drug therapy: Secondary | ICD-10-CM | POA: Diagnosis not present

## 2024-05-10 DIAGNOSIS — G9389 Other specified disorders of brain: Secondary | ICD-10-CM | POA: Diagnosis not present

## 2024-05-10 DIAGNOSIS — J9621 Acute and chronic respiratory failure with hypoxia: Secondary | ICD-10-CM | POA: Diagnosis not present

## 2024-05-10 DIAGNOSIS — R4781 Slurred speech: Secondary | ICD-10-CM | POA: Diagnosis not present

## 2024-05-10 NOTE — Telephone Encounter (Signed)
 Patient's wife states she called about Eliquis application. Is 99% sure this was completed in office but has not heard back. Patient is currently hospitalized for another stroke. Routing to Warren Pouch, CMA  She states patient never started Ohtuvayre ; wife has not heard of this medication. Only patient's name and number were written on initial enrollment form. I have resubmitted Ohtuvayre  forms to [employer] with wife's name as authorized contact  Sherry Pennant, PharmD, MPH, BCPS, CPP Clinical Pharmacist (Rheumatology and Pulmonology)

## 2024-05-10 NOTE — Telephone Encounter (Signed)
 Copied from CRM [id number]. Topic: General - Other >> May 04, 2024  4:09 PM Rilla B wrote: Reason for CRM:  Wife, Lolita calling to check the status of patient assistance paperwork.  Please call.

## 2024-05-10 NOTE — Telephone Encounter (Signed)
 I secure sent it last week I thought, just resent it. It needs Beths sig unless you can get that Im at [facility] and not at [employer]

## 2024-05-11 DIAGNOSIS — Z7902 Long term (current) use of antithrombotics/antiplatelets: Secondary | ICD-10-CM | POA: Diagnosis not present

## 2024-05-11 DIAGNOSIS — I480 Paroxysmal atrial fibrillation: Secondary | ICD-10-CM | POA: Diagnosis not present

## 2024-05-11 DIAGNOSIS — J441 Chronic obstructive pulmonary disease with (acute) exacerbation: Secondary | ICD-10-CM | POA: Diagnosis not present

## 2024-05-11 DIAGNOSIS — D696 Thrombocytopenia, unspecified: Secondary | ICD-10-CM | POA: Diagnosis not present

## 2024-05-11 DIAGNOSIS — J9621 Acute and chronic respiratory failure with hypoxia: Secondary | ICD-10-CM | POA: Diagnosis not present

## 2024-05-11 DIAGNOSIS — Z79899 Other long term (current) drug therapy: Secondary | ICD-10-CM | POA: Diagnosis not present

## 2024-05-11 DIAGNOSIS — Z9981 Dependence on supplemental oxygen: Secondary | ICD-10-CM | POA: Diagnosis not present

## 2024-05-11 DIAGNOSIS — R4781 Slurred speech: Secondary | ICD-10-CM | POA: Diagnosis not present

## 2024-05-11 NOTE — Telephone Encounter (Signed)
 Fax completed.

## 2024-05-11 NOTE — Telephone Encounter (Signed)
 Prescriber form signed for Eliquis PAP  Patient assistance application sent to [employer] for Eliquis  Phone: [phone] Fax: [phone]  Sherry Pennant, PharmD, MPH, BCPS, CPP Clinical Pharmacist (Rheumatology and Pulmonology)

## 2024-05-14 DIAGNOSIS — I10 Essential (primary) hypertension: Secondary | ICD-10-CM | POA: Diagnosis not present

## 2024-05-14 DIAGNOSIS — Z9981 Dependence on supplemental oxygen: Secondary | ICD-10-CM | POA: Diagnosis not present

## 2024-05-14 DIAGNOSIS — J9611 Chronic respiratory failure with hypoxia: Secondary | ICD-10-CM | POA: Diagnosis not present

## 2024-05-14 DIAGNOSIS — J441 Chronic obstructive pulmonary disease with (acute) exacerbation: Secondary | ICD-10-CM | POA: Diagnosis not present

## 2024-05-14 DIAGNOSIS — Z885 Allergy status to narcotic agent status: Secondary | ICD-10-CM | POA: Diagnosis not present

## 2024-05-14 DIAGNOSIS — I110 Hypertensive heart disease with heart failure: Secondary | ICD-10-CM | POA: Diagnosis not present

## 2024-05-14 DIAGNOSIS — R61 Generalized hyperhidrosis: Secondary | ICD-10-CM | POA: Diagnosis not present

## 2024-05-14 DIAGNOSIS — R062 Wheezing: Secondary | ICD-10-CM | POA: Diagnosis not present

## 2024-05-14 DIAGNOSIS — I499 Cardiac arrhythmia, unspecified: Secondary | ICD-10-CM | POA: Diagnosis not present

## 2024-05-14 DIAGNOSIS — Z7902 Long term (current) use of antithrombotics/antiplatelets: Secondary | ICD-10-CM | POA: Diagnosis not present

## 2024-05-14 DIAGNOSIS — R0600 Dyspnea, unspecified: Secondary | ICD-10-CM | POA: Diagnosis not present

## 2024-05-14 DIAGNOSIS — J449 Chronic obstructive pulmonary disease, unspecified: Secondary | ICD-10-CM | POA: Diagnosis not present

## 2024-05-14 DIAGNOSIS — I5033 Acute on chronic diastolic (congestive) heart failure: Secondary | ICD-10-CM | POA: Diagnosis not present

## 2024-05-14 DIAGNOSIS — Z87891 Personal history of nicotine dependence: Secondary | ICD-10-CM | POA: Diagnosis not present

## 2024-05-14 DIAGNOSIS — R918 Other nonspecific abnormal finding of lung field: Secondary | ICD-10-CM | POA: Diagnosis not present

## 2024-05-14 DIAGNOSIS — R0602 Shortness of breath: Secondary | ICD-10-CM | POA: Diagnosis not present

## 2024-05-14 DIAGNOSIS — I447 Left bundle-branch block, unspecified: Secondary | ICD-10-CM | POA: Diagnosis not present

## 2024-05-14 DIAGNOSIS — R0603 Acute respiratory distress: Secondary | ICD-10-CM | POA: Diagnosis not present

## 2024-05-14 DIAGNOSIS — R9431 Abnormal electrocardiogram [ECG] [EKG]: Secondary | ICD-10-CM | POA: Diagnosis not present

## 2024-05-14 DIAGNOSIS — E785 Hyperlipidemia, unspecified: Secondary | ICD-10-CM | POA: Diagnosis not present

## 2024-05-14 DIAGNOSIS — Z79899 Other long term (current) drug therapy: Secondary | ICD-10-CM | POA: Diagnosis not present

## 2024-05-14 DIAGNOSIS — I480 Paroxysmal atrial fibrillation: Secondary | ICD-10-CM | POA: Diagnosis not present

## 2024-05-14 DIAGNOSIS — Z7901 Long term (current) use of anticoagulants: Secondary | ICD-10-CM | POA: Diagnosis not present

## 2024-05-14 DIAGNOSIS — Z8673 Personal history of transient ischemic attack (TIA), and cerebral infarction without residual deficits: Secondary | ICD-10-CM | POA: Diagnosis not present

## 2024-05-14 DIAGNOSIS — Z66 Do not resuscitate: Secondary | ICD-10-CM | POA: Diagnosis not present

## 2024-05-15 DIAGNOSIS — I480 Paroxysmal atrial fibrillation: Secondary | ICD-10-CM | POA: Diagnosis not present

## 2024-05-15 DIAGNOSIS — I509 Heart failure, unspecified: Secondary | ICD-10-CM | POA: Diagnosis not present

## 2024-05-15 DIAGNOSIS — J449 Chronic obstructive pulmonary disease, unspecified: Secondary | ICD-10-CM | POA: Diagnosis not present

## 2024-05-15 DIAGNOSIS — Z79899 Other long term (current) drug therapy: Secondary | ICD-10-CM | POA: Diagnosis not present

## 2024-05-19 DIAGNOSIS — M545 Low back pain, unspecified: Secondary | ICD-10-CM | POA: Diagnosis not present

## 2024-05-19 DIAGNOSIS — J441 Chronic obstructive pulmonary disease with (acute) exacerbation: Secondary | ICD-10-CM | POA: Diagnosis not present

## 2024-05-19 DIAGNOSIS — J9621 Acute and chronic respiratory failure with hypoxia: Secondary | ICD-10-CM | POA: Diagnosis not present

## 2024-05-19 DIAGNOSIS — I480 Paroxysmal atrial fibrillation: Secondary | ICD-10-CM | POA: Diagnosis not present

## 2024-05-19 DIAGNOSIS — R4781 Slurred speech: Secondary | ICD-10-CM | POA: Diagnosis not present

## 2024-05-19 DIAGNOSIS — H269 Unspecified cataract: Secondary | ICD-10-CM | POA: Diagnosis not present

## 2024-05-19 DIAGNOSIS — Z87891 Personal history of nicotine dependence: Secondary | ICD-10-CM | POA: Diagnosis not present

## 2024-05-19 DIAGNOSIS — I5032 Chronic diastolic (congestive) heart failure: Secondary | ICD-10-CM | POA: Diagnosis not present

## 2024-05-19 DIAGNOSIS — G8929 Other chronic pain: Secondary | ICD-10-CM | POA: Diagnosis not present

## 2024-05-19 DIAGNOSIS — I110 Hypertensive heart disease with heart failure: Secondary | ICD-10-CM | POA: Diagnosis not present

## 2024-05-19 DIAGNOSIS — J9622 Acute and chronic respiratory failure with hypercapnia: Secondary | ICD-10-CM | POA: Diagnosis not present

## 2024-05-19 DIAGNOSIS — Z7902 Long term (current) use of antithrombotics/antiplatelets: Secondary | ICD-10-CM | POA: Diagnosis not present

## 2024-05-19 DIAGNOSIS — G319 Degenerative disease of nervous system, unspecified: Secondary | ICD-10-CM | POA: Diagnosis not present

## 2024-05-19 DIAGNOSIS — Z7901 Long term (current) use of anticoagulants: Secondary | ICD-10-CM | POA: Diagnosis not present

## 2024-05-19 DIAGNOSIS — Z8673 Personal history of transient ischemic attack (TIA), and cerebral infarction without residual deficits: Secondary | ICD-10-CM | POA: Diagnosis not present

## 2024-05-19 DIAGNOSIS — J439 Emphysema, unspecified: Secondary | ICD-10-CM | POA: Diagnosis not present

## 2024-05-19 DIAGNOSIS — E785 Hyperlipidemia, unspecified: Secondary | ICD-10-CM | POA: Diagnosis not present

## 2024-05-19 DIAGNOSIS — J61 Pneumoconiosis due to asbestos and other mineral fibers: Secondary | ICD-10-CM | POA: Diagnosis not present

## 2024-05-20 DIAGNOSIS — Z09 Encounter for follow-up examination after completed treatment for conditions other than malignant neoplasm: Secondary | ICD-10-CM | POA: Diagnosis not present

## 2024-05-20 DIAGNOSIS — J9611 Chronic respiratory failure with hypoxia: Secondary | ICD-10-CM | POA: Diagnosis not present

## 2024-05-20 DIAGNOSIS — Z8673 Personal history of transient ischemic attack (TIA), and cerebral infarction without residual deficits: Secondary | ICD-10-CM | POA: Diagnosis not present

## 2024-05-20 DIAGNOSIS — J449 Chronic obstructive pulmonary disease, unspecified: Secondary | ICD-10-CM | POA: Diagnosis not present

## 2024-05-24 DIAGNOSIS — I69320 Aphasia following cerebral infarction: Secondary | ICD-10-CM | POA: Diagnosis not present

## 2024-05-24 DIAGNOSIS — I110 Hypertensive heart disease with heart failure: Secondary | ICD-10-CM | POA: Diagnosis not present

## 2024-05-24 DIAGNOSIS — I69398 Other sequelae of cerebral infarction: Secondary | ICD-10-CM | POA: Diagnosis not present

## 2024-05-24 DIAGNOSIS — R531 Weakness: Secondary | ICD-10-CM | POA: Diagnosis not present

## 2024-05-25 ENCOUNTER — Ambulatory Visit (HOSPITAL_BASED_OUTPATIENT_CLINIC_OR_DEPARTMENT_OTHER): Admitting: Primary Care

## 2024-05-27 DIAGNOSIS — R4781 Slurred speech: Secondary | ICD-10-CM | POA: Diagnosis not present

## 2024-05-27 DIAGNOSIS — J9621 Acute and chronic respiratory failure with hypoxia: Secondary | ICD-10-CM | POA: Diagnosis not present

## 2024-05-27 DIAGNOSIS — I5032 Chronic diastolic (congestive) heart failure: Secondary | ICD-10-CM | POA: Diagnosis not present

## 2024-05-27 DIAGNOSIS — M545 Low back pain, unspecified: Secondary | ICD-10-CM | POA: Diagnosis not present

## 2024-05-27 DIAGNOSIS — I480 Paroxysmal atrial fibrillation: Secondary | ICD-10-CM | POA: Diagnosis not present

## 2024-05-27 DIAGNOSIS — Z87891 Personal history of nicotine dependence: Secondary | ICD-10-CM | POA: Diagnosis not present

## 2024-05-27 DIAGNOSIS — H269 Unspecified cataract: Secondary | ICD-10-CM | POA: Diagnosis not present

## 2024-05-27 DIAGNOSIS — J439 Emphysema, unspecified: Secondary | ICD-10-CM | POA: Diagnosis not present

## 2024-05-27 DIAGNOSIS — G8929 Other chronic pain: Secondary | ICD-10-CM | POA: Diagnosis not present

## 2024-05-27 DIAGNOSIS — Z8673 Personal history of transient ischemic attack (TIA), and cerebral infarction without residual deficits: Secondary | ICD-10-CM | POA: Diagnosis not present

## 2024-05-27 DIAGNOSIS — Z7901 Long term (current) use of anticoagulants: Secondary | ICD-10-CM | POA: Diagnosis not present

## 2024-05-27 DIAGNOSIS — J441 Chronic obstructive pulmonary disease with (acute) exacerbation: Secondary | ICD-10-CM | POA: Diagnosis not present

## 2024-05-27 DIAGNOSIS — J9622 Acute and chronic respiratory failure with hypercapnia: Secondary | ICD-10-CM | POA: Diagnosis not present

## 2024-05-27 DIAGNOSIS — I110 Hypertensive heart disease with heart failure: Secondary | ICD-10-CM | POA: Diagnosis not present

## 2024-05-27 DIAGNOSIS — Z7902 Long term (current) use of antithrombotics/antiplatelets: Secondary | ICD-10-CM | POA: Diagnosis not present

## 2024-05-27 DIAGNOSIS — G319 Degenerative disease of nervous system, unspecified: Secondary | ICD-10-CM | POA: Diagnosis not present

## 2024-05-27 DIAGNOSIS — J61 Pneumoconiosis due to asbestos and other mineral fibers: Secondary | ICD-10-CM | POA: Diagnosis not present

## 2024-05-27 DIAGNOSIS — E785 Hyperlipidemia, unspecified: Secondary | ICD-10-CM | POA: Diagnosis not present

## 2024-05-29 DIAGNOSIS — J439 Emphysema, unspecified: Secondary | ICD-10-CM | POA: Diagnosis not present

## 2024-05-29 DIAGNOSIS — J441 Chronic obstructive pulmonary disease with (acute) exacerbation: Secondary | ICD-10-CM | POA: Diagnosis not present

## 2024-05-29 DIAGNOSIS — M545 Low back pain, unspecified: Secondary | ICD-10-CM | POA: Diagnosis not present

## 2024-06-01 DIAGNOSIS — Z743 Need for continuous supervision: Secondary | ICD-10-CM | POA: Diagnosis not present

## 2024-06-01 DIAGNOSIS — Z7902 Long term (current) use of antithrombotics/antiplatelets: Secondary | ICD-10-CM | POA: Diagnosis not present

## 2024-06-01 DIAGNOSIS — R0682 Tachypnea, not elsewhere classified: Secondary | ICD-10-CM | POA: Diagnosis not present

## 2024-06-01 DIAGNOSIS — R001 Bradycardia, unspecified: Secondary | ICD-10-CM | POA: Diagnosis not present

## 2024-06-01 DIAGNOSIS — J929 Pleural plaque without asbestos: Secondary | ICD-10-CM | POA: Diagnosis not present

## 2024-06-01 DIAGNOSIS — E785 Hyperlipidemia, unspecified: Secondary | ICD-10-CM | POA: Diagnosis not present

## 2024-06-01 DIAGNOSIS — I5032 Chronic diastolic (congestive) heart failure: Secondary | ICD-10-CM | POA: Diagnosis not present

## 2024-06-01 DIAGNOSIS — Z66 Do not resuscitate: Secondary | ICD-10-CM | POA: Diagnosis not present

## 2024-06-01 DIAGNOSIS — Z885 Allergy status to narcotic agent status: Secondary | ICD-10-CM | POA: Diagnosis not present

## 2024-06-01 DIAGNOSIS — R062 Wheezing: Secondary | ICD-10-CM | POA: Diagnosis not present

## 2024-06-01 DIAGNOSIS — R0603 Acute respiratory distress: Secondary | ICD-10-CM | POA: Diagnosis not present

## 2024-06-01 DIAGNOSIS — Z79899 Other long term (current) drug therapy: Secondary | ICD-10-CM | POA: Diagnosis not present

## 2024-06-01 DIAGNOSIS — J439 Emphysema, unspecified: Secondary | ICD-10-CM | POA: Diagnosis not present

## 2024-06-01 DIAGNOSIS — Z7901 Long term (current) use of anticoagulants: Secondary | ICD-10-CM | POA: Diagnosis not present

## 2024-06-01 DIAGNOSIS — I480 Paroxysmal atrial fibrillation: Secondary | ICD-10-CM | POA: Diagnosis not present

## 2024-06-01 DIAGNOSIS — Z604 Social exclusion and rejection: Secondary | ICD-10-CM | POA: Diagnosis not present

## 2024-06-01 DIAGNOSIS — I499 Cardiac arrhythmia, unspecified: Secondary | ICD-10-CM | POA: Diagnosis not present

## 2024-06-01 DIAGNOSIS — I110 Hypertensive heart disease with heart failure: Secondary | ICD-10-CM | POA: Diagnosis not present

## 2024-06-01 DIAGNOSIS — R065 Mouth breathing: Secondary | ICD-10-CM | POA: Diagnosis not present

## 2024-06-01 DIAGNOSIS — Z8673 Personal history of transient ischemic attack (TIA), and cerebral infarction without residual deficits: Secondary | ICD-10-CM | POA: Diagnosis not present

## 2024-06-01 DIAGNOSIS — R918 Other nonspecific abnormal finding of lung field: Secondary | ICD-10-CM | POA: Diagnosis not present

## 2024-06-01 DIAGNOSIS — J441 Chronic obstructive pulmonary disease with (acute) exacerbation: Secondary | ICD-10-CM | POA: Diagnosis not present

## 2024-06-01 DIAGNOSIS — R000 Tachycardia, unspecified: Secondary | ICD-10-CM | POA: Diagnosis not present

## 2024-06-01 DIAGNOSIS — Z87891 Personal history of nicotine dependence: Secondary | ICD-10-CM | POA: Diagnosis not present

## 2024-06-01 DIAGNOSIS — R0602 Shortness of breath: Secondary | ICD-10-CM | POA: Diagnosis not present

## 2024-06-09 DIAGNOSIS — J441 Chronic obstructive pulmonary disease with (acute) exacerbation: Secondary | ICD-10-CM | POA: Diagnosis not present

## 2024-06-09 DIAGNOSIS — I10 Essential (primary) hypertension: Secondary | ICD-10-CM | POA: Diagnosis not present

## 2024-06-09 DIAGNOSIS — Z09 Encounter for follow-up examination after completed treatment for conditions other than malignant neoplasm: Secondary | ICD-10-CM | POA: Diagnosis not present

## 2024-06-09 DIAGNOSIS — I4891 Unspecified atrial fibrillation: Secondary | ICD-10-CM | POA: Diagnosis not present

## 2024-06-11 DIAGNOSIS — J9621 Acute and chronic respiratory failure with hypoxia: Secondary | ICD-10-CM | POA: Diagnosis not present

## 2024-06-11 DIAGNOSIS — Z79899 Other long term (current) drug therapy: Secondary | ICD-10-CM | POA: Diagnosis not present

## 2024-06-11 DIAGNOSIS — Z91148 Patient's other noncompliance with medication regimen for other reason: Secondary | ICD-10-CM | POA: Diagnosis not present

## 2024-06-11 DIAGNOSIS — I499 Cardiac arrhythmia, unspecified: Secondary | ICD-10-CM | POA: Diagnosis not present

## 2024-06-11 DIAGNOSIS — I6789 Other cerebrovascular disease: Secondary | ICD-10-CM | POA: Diagnosis not present

## 2024-06-11 DIAGNOSIS — I5032 Chronic diastolic (congestive) heart failure: Secondary | ICD-10-CM | POA: Diagnosis not present

## 2024-06-11 DIAGNOSIS — I959 Hypotension, unspecified: Secondary | ICD-10-CM | POA: Diagnosis not present

## 2024-06-11 DIAGNOSIS — R4701 Aphasia: Secondary | ICD-10-CM | POA: Diagnosis not present

## 2024-06-11 DIAGNOSIS — J439 Emphysema, unspecified: Secondary | ICD-10-CM | POA: Diagnosis not present

## 2024-06-11 DIAGNOSIS — R062 Wheezing: Secondary | ICD-10-CM | POA: Diagnosis not present

## 2024-06-11 DIAGNOSIS — I252 Old myocardial infarction: Secondary | ICD-10-CM | POA: Diagnosis not present

## 2024-06-11 DIAGNOSIS — E785 Hyperlipidemia, unspecified: Secondary | ICD-10-CM | POA: Diagnosis not present

## 2024-06-11 DIAGNOSIS — R0689 Other abnormalities of breathing: Secondary | ICD-10-CM | POA: Diagnosis not present

## 2024-06-11 DIAGNOSIS — Z8673 Personal history of transient ischemic attack (TIA), and cerebral infarction without residual deficits: Secondary | ICD-10-CM | POA: Diagnosis not present

## 2024-06-11 DIAGNOSIS — R918 Other nonspecific abnormal finding of lung field: Secondary | ICD-10-CM | POA: Diagnosis not present

## 2024-06-11 DIAGNOSIS — I480 Paroxysmal atrial fibrillation: Secondary | ICD-10-CM | POA: Diagnosis not present

## 2024-06-11 DIAGNOSIS — Z7901 Long term (current) use of anticoagulants: Secondary | ICD-10-CM | POA: Diagnosis not present

## 2024-06-11 DIAGNOSIS — I110 Hypertensive heart disease with heart failure: Secondary | ICD-10-CM | POA: Diagnosis not present

## 2024-06-11 DIAGNOSIS — J449 Chronic obstructive pulmonary disease, unspecified: Secondary | ICD-10-CM | POA: Diagnosis not present

## 2024-06-11 DIAGNOSIS — Z743 Need for continuous supervision: Secondary | ICD-10-CM | POA: Diagnosis not present

## 2024-06-11 DIAGNOSIS — I482 Chronic atrial fibrillation, unspecified: Secondary | ICD-10-CM | POA: Diagnosis not present

## 2024-06-11 DIAGNOSIS — Z9981 Dependence on supplemental oxygen: Secondary | ICD-10-CM | POA: Diagnosis not present

## 2024-06-11 DIAGNOSIS — R0603 Acute respiratory distress: Secondary | ICD-10-CM | POA: Diagnosis not present

## 2024-06-11 DIAGNOSIS — Z66 Do not resuscitate: Secondary | ICD-10-CM | POA: Diagnosis not present

## 2024-06-11 DIAGNOSIS — R000 Tachycardia, unspecified: Secondary | ICD-10-CM | POA: Diagnosis not present

## 2024-06-11 DIAGNOSIS — Z7902 Long term (current) use of antithrombotics/antiplatelets: Secondary | ICD-10-CM | POA: Diagnosis not present

## 2024-06-11 DIAGNOSIS — R0602 Shortness of breath: Secondary | ICD-10-CM | POA: Diagnosis not present

## 2024-06-11 DIAGNOSIS — R471 Dysarthria and anarthria: Secondary | ICD-10-CM | POA: Diagnosis not present

## 2024-06-11 DIAGNOSIS — J441 Chronic obstructive pulmonary disease with (acute) exacerbation: Secondary | ICD-10-CM | POA: Diagnosis not present

## 2024-06-11 NOTE — Telephone Encounter (Signed)
 Refaxed OHTUVYARE enrollment form per request of [employer] (they requested on 05/10/2024)

## 2024-06-12 DIAGNOSIS — Z9981 Dependence on supplemental oxygen: Secondary | ICD-10-CM | POA: Diagnosis not present

## 2024-06-12 DIAGNOSIS — Z8673 Personal history of transient ischemic attack (TIA), and cerebral infarction without residual deficits: Secondary | ICD-10-CM | POA: Diagnosis not present

## 2024-06-12 DIAGNOSIS — J9621 Acute and chronic respiratory failure with hypoxia: Secondary | ICD-10-CM | POA: Diagnosis not present

## 2024-06-12 DIAGNOSIS — Z7982 Long term (current) use of aspirin: Secondary | ICD-10-CM | POA: Diagnosis not present

## 2024-06-12 DIAGNOSIS — I639 Cerebral infarction, unspecified: Secondary | ICD-10-CM | POA: Diagnosis not present

## 2024-06-12 DIAGNOSIS — R471 Dysarthria and anarthria: Secondary | ICD-10-CM | POA: Diagnosis not present

## 2024-06-12 DIAGNOSIS — I5032 Chronic diastolic (congestive) heart failure: Secondary | ICD-10-CM | POA: Diagnosis not present

## 2024-06-12 DIAGNOSIS — J441 Chronic obstructive pulmonary disease with (acute) exacerbation: Secondary | ICD-10-CM | POA: Diagnosis not present

## 2024-06-12 DIAGNOSIS — Z7902 Long term (current) use of antithrombotics/antiplatelets: Secondary | ICD-10-CM | POA: Diagnosis not present

## 2024-06-12 DIAGNOSIS — E785 Hyperlipidemia, unspecified: Secondary | ICD-10-CM | POA: Diagnosis not present

## 2024-06-12 DIAGNOSIS — I252 Old myocardial infarction: Secondary | ICD-10-CM | POA: Diagnosis not present

## 2024-06-12 DIAGNOSIS — Z66 Do not resuscitate: Secondary | ICD-10-CM | POA: Diagnosis not present

## 2024-06-12 DIAGNOSIS — I480 Paroxysmal atrial fibrillation: Secondary | ICD-10-CM | POA: Diagnosis not present

## 2024-06-12 DIAGNOSIS — J449 Chronic obstructive pulmonary disease, unspecified: Secondary | ICD-10-CM | POA: Diagnosis not present

## 2024-06-12 DIAGNOSIS — I6789 Other cerebrovascular disease: Secondary | ICD-10-CM | POA: Diagnosis not present

## 2024-06-12 DIAGNOSIS — I110 Hypertensive heart disease with heart failure: Secondary | ICD-10-CM | POA: Diagnosis not present

## 2024-06-12 DIAGNOSIS — Z79899 Other long term (current) drug therapy: Secondary | ICD-10-CM | POA: Diagnosis not present

## 2024-06-12 DIAGNOSIS — R000 Tachycardia, unspecified: Secondary | ICD-10-CM | POA: Diagnosis not present

## 2024-06-12 DIAGNOSIS — Z91148 Patient's other noncompliance with medication regimen for other reason: Secondary | ICD-10-CM | POA: Diagnosis not present

## 2024-06-12 DIAGNOSIS — R4701 Aphasia: Secondary | ICD-10-CM | POA: Diagnosis not present

## 2024-06-12 DIAGNOSIS — J439 Emphysema, unspecified: Secondary | ICD-10-CM | POA: Diagnosis not present

## 2024-06-13 DIAGNOSIS — Z7902 Long term (current) use of antithrombotics/antiplatelets: Secondary | ICD-10-CM | POA: Diagnosis not present

## 2024-06-13 DIAGNOSIS — J9621 Acute and chronic respiratory failure with hypoxia: Secondary | ICD-10-CM | POA: Diagnosis not present

## 2024-06-13 DIAGNOSIS — I480 Paroxysmal atrial fibrillation: Secondary | ICD-10-CM | POA: Diagnosis not present

## 2024-06-13 DIAGNOSIS — J439 Emphysema, unspecified: Secondary | ICD-10-CM | POA: Diagnosis not present

## 2024-06-13 DIAGNOSIS — Z8673 Personal history of transient ischemic attack (TIA), and cerebral infarction without residual deficits: Secondary | ICD-10-CM | POA: Diagnosis not present

## 2024-06-13 DIAGNOSIS — I6789 Other cerebrovascular disease: Secondary | ICD-10-CM | POA: Diagnosis not present

## 2024-06-13 DIAGNOSIS — R4701 Aphasia: Secondary | ICD-10-CM | POA: Diagnosis not present

## 2024-06-13 DIAGNOSIS — I110 Hypertensive heart disease with heart failure: Secondary | ICD-10-CM | POA: Diagnosis not present

## 2024-06-13 DIAGNOSIS — J441 Chronic obstructive pulmonary disease with (acute) exacerbation: Secondary | ICD-10-CM | POA: Diagnosis not present

## 2024-06-13 DIAGNOSIS — R471 Dysarthria and anarthria: Secondary | ICD-10-CM | POA: Diagnosis not present

## 2024-06-13 DIAGNOSIS — G9389 Other specified disorders of brain: Secondary | ICD-10-CM | POA: Diagnosis not present

## 2024-06-13 DIAGNOSIS — Z7982 Long term (current) use of aspirin: Secondary | ICD-10-CM | POA: Diagnosis not present

## 2024-06-13 DIAGNOSIS — I252 Old myocardial infarction: Secondary | ICD-10-CM | POA: Diagnosis not present

## 2024-06-13 DIAGNOSIS — R000 Tachycardia, unspecified: Secondary | ICD-10-CM | POA: Diagnosis not present

## 2024-06-13 DIAGNOSIS — Z79899 Other long term (current) drug therapy: Secondary | ICD-10-CM | POA: Diagnosis not present

## 2024-06-13 DIAGNOSIS — I639 Cerebral infarction, unspecified: Secondary | ICD-10-CM | POA: Diagnosis not present

## 2024-06-13 DIAGNOSIS — Z9981 Dependence on supplemental oxygen: Secondary | ICD-10-CM | POA: Diagnosis not present

## 2024-06-13 DIAGNOSIS — I5032 Chronic diastolic (congestive) heart failure: Secondary | ICD-10-CM | POA: Diagnosis not present

## 2024-06-13 DIAGNOSIS — E785 Hyperlipidemia, unspecified: Secondary | ICD-10-CM | POA: Diagnosis not present

## 2024-06-13 DIAGNOSIS — J449 Chronic obstructive pulmonary disease, unspecified: Secondary | ICD-10-CM | POA: Diagnosis not present

## 2024-06-13 DIAGNOSIS — Z66 Do not resuscitate: Secondary | ICD-10-CM | POA: Diagnosis not present

## 2024-06-13 DIAGNOSIS — Z91148 Patient's other noncompliance with medication regimen for other reason: Secondary | ICD-10-CM | POA: Diagnosis not present

## 2024-06-14 ENCOUNTER — Telehealth: Payer: Self-pay | Admitting: Pharmacist

## 2024-06-14 DIAGNOSIS — R471 Dysarthria and anarthria: Secondary | ICD-10-CM | POA: Diagnosis not present

## 2024-06-14 DIAGNOSIS — Z66 Do not resuscitate: Secondary | ICD-10-CM | POA: Diagnosis not present

## 2024-06-14 DIAGNOSIS — Z7982 Long term (current) use of aspirin: Secondary | ICD-10-CM | POA: Diagnosis not present

## 2024-06-14 DIAGNOSIS — I110 Hypertensive heart disease with heart failure: Secondary | ICD-10-CM | POA: Diagnosis not present

## 2024-06-14 DIAGNOSIS — Z91148 Patient's other noncompliance with medication regimen for other reason: Secondary | ICD-10-CM | POA: Diagnosis not present

## 2024-06-14 DIAGNOSIS — Z8673 Personal history of transient ischemic attack (TIA), and cerebral infarction without residual deficits: Secondary | ICD-10-CM | POA: Diagnosis not present

## 2024-06-14 DIAGNOSIS — J9621 Acute and chronic respiratory failure with hypoxia: Secondary | ICD-10-CM | POA: Diagnosis not present

## 2024-06-14 DIAGNOSIS — R000 Tachycardia, unspecified: Secondary | ICD-10-CM | POA: Diagnosis not present

## 2024-06-14 DIAGNOSIS — Z79899 Other long term (current) drug therapy: Secondary | ICD-10-CM | POA: Diagnosis not present

## 2024-06-14 DIAGNOSIS — Z9981 Dependence on supplemental oxygen: Secondary | ICD-10-CM | POA: Diagnosis not present

## 2024-06-14 DIAGNOSIS — I480 Paroxysmal atrial fibrillation: Secondary | ICD-10-CM | POA: Diagnosis not present

## 2024-06-14 DIAGNOSIS — E785 Hyperlipidemia, unspecified: Secondary | ICD-10-CM | POA: Diagnosis not present

## 2024-06-14 DIAGNOSIS — R0603 Acute respiratory distress: Secondary | ICD-10-CM | POA: Diagnosis not present

## 2024-06-14 DIAGNOSIS — R4701 Aphasia: Secondary | ICD-10-CM | POA: Diagnosis not present

## 2024-06-14 DIAGNOSIS — I6789 Other cerebrovascular disease: Secondary | ICD-10-CM | POA: Diagnosis not present

## 2024-06-14 DIAGNOSIS — I252 Old myocardial infarction: Secondary | ICD-10-CM | POA: Diagnosis not present

## 2024-06-14 DIAGNOSIS — I5032 Chronic diastolic (congestive) heart failure: Secondary | ICD-10-CM | POA: Diagnosis not present

## 2024-06-14 DIAGNOSIS — J439 Emphysema, unspecified: Secondary | ICD-10-CM | POA: Diagnosis not present

## 2024-06-14 DIAGNOSIS — J449 Chronic obstructive pulmonary disease, unspecified: Secondary | ICD-10-CM | POA: Diagnosis not present

## 2024-06-14 DIAGNOSIS — J441 Chronic obstructive pulmonary disease with (acute) exacerbation: Secondary | ICD-10-CM | POA: Diagnosis not present

## 2024-06-14 DIAGNOSIS — Z7902 Long term (current) use of antithrombotics/antiplatelets: Secondary | ICD-10-CM | POA: Diagnosis not present

## 2024-06-14 NOTE — Telephone Encounter (Signed)
 Received fax from [employer] confirming receipt of Ohtuvayre  enrollment form  Patient ID: [id number] Phone#: [phone] Fax#: [phone]

## 2024-06-15 DIAGNOSIS — Z91148 Patient's other noncompliance with medication regimen for other reason: Secondary | ICD-10-CM | POA: Diagnosis not present

## 2024-06-15 DIAGNOSIS — I6789 Other cerebrovascular disease: Secondary | ICD-10-CM | POA: Diagnosis not present

## 2024-06-15 DIAGNOSIS — Z8673 Personal history of transient ischemic attack (TIA), and cerebral infarction without residual deficits: Secondary | ICD-10-CM | POA: Diagnosis not present

## 2024-06-15 DIAGNOSIS — J9621 Acute and chronic respiratory failure with hypoxia: Secondary | ICD-10-CM | POA: Diagnosis not present

## 2024-06-15 DIAGNOSIS — J449 Chronic obstructive pulmonary disease, unspecified: Secondary | ICD-10-CM | POA: Diagnosis not present

## 2024-06-15 DIAGNOSIS — E785 Hyperlipidemia, unspecified: Secondary | ICD-10-CM | POA: Diagnosis not present

## 2024-06-15 DIAGNOSIS — I5032 Chronic diastolic (congestive) heart failure: Secondary | ICD-10-CM | POA: Diagnosis not present

## 2024-06-15 DIAGNOSIS — Z7951 Long term (current) use of inhaled steroids: Secondary | ICD-10-CM | POA: Diagnosis not present

## 2024-06-15 DIAGNOSIS — Z79899 Other long term (current) drug therapy: Secondary | ICD-10-CM | POA: Diagnosis not present

## 2024-06-15 DIAGNOSIS — I110 Hypertensive heart disease with heart failure: Secondary | ICD-10-CM | POA: Diagnosis not present

## 2024-06-15 DIAGNOSIS — J439 Emphysema, unspecified: Secondary | ICD-10-CM | POA: Diagnosis not present

## 2024-06-15 DIAGNOSIS — J441 Chronic obstructive pulmonary disease with (acute) exacerbation: Secondary | ICD-10-CM | POA: Diagnosis not present

## 2024-06-15 DIAGNOSIS — R0603 Acute respiratory distress: Secondary | ICD-10-CM | POA: Diagnosis not present

## 2024-06-15 DIAGNOSIS — Z9989 Dependence on other enabling machines and devices: Secondary | ICD-10-CM | POA: Diagnosis not present

## 2024-06-15 DIAGNOSIS — R471 Dysarthria and anarthria: Secondary | ICD-10-CM | POA: Diagnosis not present

## 2024-06-15 DIAGNOSIS — I480 Paroxysmal atrial fibrillation: Secondary | ICD-10-CM | POA: Diagnosis not present

## 2024-06-15 DIAGNOSIS — R1310 Dysphagia, unspecified: Secondary | ICD-10-CM | POA: Diagnosis not present

## 2024-06-15 DIAGNOSIS — I252 Old myocardial infarction: Secondary | ICD-10-CM | POA: Diagnosis not present

## 2024-06-15 DIAGNOSIS — Z9981 Dependence on supplemental oxygen: Secondary | ICD-10-CM | POA: Diagnosis not present

## 2024-06-15 DIAGNOSIS — Z66 Do not resuscitate: Secondary | ICD-10-CM | POA: Diagnosis not present

## 2024-06-15 DIAGNOSIS — Z7902 Long term (current) use of antithrombotics/antiplatelets: Secondary | ICD-10-CM | POA: Diagnosis not present

## 2024-06-15 DIAGNOSIS — R4701 Aphasia: Secondary | ICD-10-CM | POA: Diagnosis not present

## 2024-06-15 DIAGNOSIS — R000 Tachycardia, unspecified: Secondary | ICD-10-CM | POA: Diagnosis not present

## 2024-06-15 DIAGNOSIS — Z7982 Long term (current) use of aspirin: Secondary | ICD-10-CM | POA: Diagnosis not present

## 2024-06-16 DIAGNOSIS — J9611 Chronic respiratory failure with hypoxia: Secondary | ICD-10-CM | POA: Diagnosis not present

## 2024-06-16 DIAGNOSIS — J9621 Acute and chronic respiratory failure with hypoxia: Secondary | ICD-10-CM | POA: Diagnosis not present

## 2024-06-16 DIAGNOSIS — J449 Chronic obstructive pulmonary disease, unspecified: Secondary | ICD-10-CM | POA: Diagnosis not present

## 2024-06-16 DIAGNOSIS — I480 Paroxysmal atrial fibrillation: Secondary | ICD-10-CM | POA: Diagnosis not present

## 2024-06-16 DIAGNOSIS — Z8673 Personal history of transient ischemic attack (TIA), and cerebral infarction without residual deficits: Secondary | ICD-10-CM | POA: Diagnosis not present

## 2024-06-16 DIAGNOSIS — I5032 Chronic diastolic (congestive) heart failure: Secondary | ICD-10-CM | POA: Diagnosis not present

## 2024-06-16 DIAGNOSIS — Z9981 Dependence on supplemental oxygen: Secondary | ICD-10-CM | POA: Diagnosis not present

## 2024-06-16 DIAGNOSIS — Z66 Do not resuscitate: Secondary | ICD-10-CM | POA: Diagnosis not present

## 2024-06-16 DIAGNOSIS — R1310 Dysphagia, unspecified: Secondary | ICD-10-CM | POA: Diagnosis not present

## 2024-06-16 DIAGNOSIS — I6789 Other cerebrovascular disease: Secondary | ICD-10-CM | POA: Diagnosis not present

## 2024-06-16 DIAGNOSIS — R4701 Aphasia: Secondary | ICD-10-CM | POA: Diagnosis not present

## 2024-06-16 DIAGNOSIS — I10 Essential (primary) hypertension: Secondary | ICD-10-CM | POA: Diagnosis not present

## 2024-06-16 DIAGNOSIS — E785 Hyperlipidemia, unspecified: Secondary | ICD-10-CM | POA: Diagnosis not present

## 2024-06-16 DIAGNOSIS — J439 Emphysema, unspecified: Secondary | ICD-10-CM | POA: Diagnosis not present

## 2024-06-16 DIAGNOSIS — Z91148 Patient's other noncompliance with medication regimen for other reason: Secondary | ICD-10-CM | POA: Diagnosis not present

## 2024-06-16 DIAGNOSIS — Z79899 Other long term (current) drug therapy: Secondary | ICD-10-CM | POA: Diagnosis not present

## 2024-06-16 DIAGNOSIS — H04129 Dry eye syndrome of unspecified lacrimal gland: Secondary | ICD-10-CM | POA: Diagnosis not present

## 2024-06-16 DIAGNOSIS — J441 Chronic obstructive pulmonary disease with (acute) exacerbation: Secondary | ICD-10-CM | POA: Diagnosis not present

## 2024-06-16 DIAGNOSIS — I110 Hypertensive heart disease with heart failure: Secondary | ICD-10-CM | POA: Diagnosis not present

## 2024-06-16 DIAGNOSIS — I252 Old myocardial infarction: Secondary | ICD-10-CM | POA: Diagnosis not present

## 2024-06-16 DIAGNOSIS — Z7902 Long term (current) use of antithrombotics/antiplatelets: Secondary | ICD-10-CM | POA: Diagnosis not present

## 2024-06-16 DIAGNOSIS — R471 Dysarthria and anarthria: Secondary | ICD-10-CM | POA: Diagnosis not present

## 2024-06-16 DIAGNOSIS — R000 Tachycardia, unspecified: Secondary | ICD-10-CM | POA: Diagnosis not present

## 2024-06-16 NOTE — Telephone Encounter (Signed)
 Received fax from [employer] with summary of benefits. Referral form for Ohtuvayre  received. Rx will be triaged to [facility].. Once benefits investigation completed, pharmacy will reach out the patient to schedule shipment. If medication is unaffordable, patient will need to express financial hardship to be referred back to [employer] for patient assistance program pre-screening.   Patient ID: [id number] Pharmacy phone: [phone] [facility] Phone#: [phone]

## 2024-06-22 DIAGNOSIS — H04129 Dry eye syndrome of unspecified lacrimal gland: Secondary | ICD-10-CM | POA: Diagnosis not present

## 2024-06-24 DIAGNOSIS — I480 Paroxysmal atrial fibrillation: Secondary | ICD-10-CM | POA: Diagnosis not present

## 2024-06-24 DIAGNOSIS — J441 Chronic obstructive pulmonary disease with (acute) exacerbation: Secondary | ICD-10-CM | POA: Diagnosis not present

## 2024-06-24 DIAGNOSIS — I5032 Chronic diastolic (congestive) heart failure: Secondary | ICD-10-CM | POA: Diagnosis not present

## 2024-06-24 DIAGNOSIS — I110 Hypertensive heart disease with heart failure: Secondary | ICD-10-CM | POA: Diagnosis not present

## 2024-06-28 DIAGNOSIS — R319 Hematuria, unspecified: Secondary | ICD-10-CM | POA: Diagnosis not present

## 2024-06-28 DIAGNOSIS — H539 Unspecified visual disturbance: Secondary | ICD-10-CM | POA: Diagnosis not present

## 2024-06-28 DIAGNOSIS — J449 Chronic obstructive pulmonary disease, unspecified: Secondary | ICD-10-CM | POA: Diagnosis not present

## 2024-06-28 DIAGNOSIS — Z885 Allergy status to narcotic agent status: Secondary | ICD-10-CM | POA: Diagnosis not present

## 2024-06-28 DIAGNOSIS — G9389 Other specified disorders of brain: Secondary | ICD-10-CM | POA: Diagnosis not present

## 2024-06-28 DIAGNOSIS — R262 Difficulty in walking, not elsewhere classified: Secondary | ICD-10-CM | POA: Diagnosis not present

## 2024-06-28 DIAGNOSIS — Z79899 Other long term (current) drug therapy: Secondary | ICD-10-CM | POA: Diagnosis not present

## 2024-06-28 DIAGNOSIS — R061 Stridor: Secondary | ICD-10-CM | POA: Diagnosis not present

## 2024-06-28 DIAGNOSIS — I499 Cardiac arrhythmia, unspecified: Secondary | ICD-10-CM | POA: Diagnosis not present

## 2024-06-28 DIAGNOSIS — N2889 Other specified disorders of kidney and ureter: Secondary | ICD-10-CM | POA: Diagnosis not present

## 2024-06-28 DIAGNOSIS — E785 Hyperlipidemia, unspecified: Secondary | ICD-10-CM | POA: Diagnosis not present

## 2024-06-28 DIAGNOSIS — Z8673 Personal history of transient ischemic attack (TIA), and cerebral infarction without residual deficits: Secondary | ICD-10-CM | POA: Diagnosis not present

## 2024-06-28 DIAGNOSIS — R9082 White matter disease, unspecified: Secondary | ICD-10-CM | POA: Diagnosis not present

## 2024-06-28 DIAGNOSIS — I10 Essential (primary) hypertension: Secondary | ICD-10-CM | POA: Diagnosis not present

## 2024-06-28 DIAGNOSIS — R0603 Acute respiratory distress: Secondary | ICD-10-CM | POA: Diagnosis not present

## 2024-06-28 DIAGNOSIS — Z7951 Long term (current) use of inhaled steroids: Secondary | ICD-10-CM | POA: Diagnosis not present

## 2024-06-28 DIAGNOSIS — Z792 Long term (current) use of antibiotics: Secondary | ICD-10-CM | POA: Diagnosis not present

## 2024-06-28 DIAGNOSIS — J90 Pleural effusion, not elsewhere classified: Secondary | ICD-10-CM | POA: Diagnosis not present

## 2024-06-28 DIAGNOSIS — Z87891 Personal history of nicotine dependence: Secondary | ICD-10-CM | POA: Diagnosis not present

## 2024-06-28 DIAGNOSIS — K5730 Diverticulosis of large intestine without perforation or abscess without bleeding: Secondary | ICD-10-CM | POA: Diagnosis not present

## 2024-06-28 DIAGNOSIS — R54 Age-related physical debility: Secondary | ICD-10-CM | POA: Diagnosis not present

## 2024-06-28 DIAGNOSIS — Z7902 Long term (current) use of antithrombotics/antiplatelets: Secondary | ICD-10-CM | POA: Diagnosis not present

## 2024-06-28 DIAGNOSIS — I639 Cerebral infarction, unspecified: Secondary | ICD-10-CM | POA: Diagnosis not present

## 2024-06-28 DIAGNOSIS — I5032 Chronic diastolic (congestive) heart failure: Secondary | ICD-10-CM | POA: Diagnosis not present

## 2024-06-28 DIAGNOSIS — R4781 Slurred speech: Secondary | ICD-10-CM | POA: Diagnosis not present

## 2024-06-28 DIAGNOSIS — R062 Wheezing: Secondary | ICD-10-CM | POA: Diagnosis not present

## 2024-06-28 DIAGNOSIS — J9601 Acute respiratory failure with hypoxia: Secondary | ICD-10-CM | POA: Diagnosis not present

## 2024-06-28 DIAGNOSIS — Z9981 Dependence on supplemental oxygen: Secondary | ICD-10-CM | POA: Diagnosis not present

## 2024-06-28 DIAGNOSIS — Z7982 Long term (current) use of aspirin: Secondary | ICD-10-CM | POA: Diagnosis not present

## 2024-06-28 DIAGNOSIS — R404 Transient alteration of awareness: Secondary | ICD-10-CM | POA: Diagnosis not present

## 2024-06-28 DIAGNOSIS — R0602 Shortness of breath: Secondary | ICD-10-CM | POA: Diagnosis not present

## 2024-06-28 DIAGNOSIS — I6389 Other cerebral infarction: Secondary | ICD-10-CM | POA: Diagnosis not present

## 2024-06-28 DIAGNOSIS — I480 Paroxysmal atrial fibrillation: Secondary | ICD-10-CM | POA: Diagnosis not present

## 2024-06-28 DIAGNOSIS — R6520 Severe sepsis without septic shock: Secondary | ICD-10-CM | POA: Diagnosis not present

## 2024-06-28 DIAGNOSIS — Z7401 Bed confinement status: Secondary | ICD-10-CM | POA: Diagnosis not present

## 2024-06-28 DIAGNOSIS — H538 Other visual disturbances: Secondary | ICD-10-CM | POA: Diagnosis not present

## 2024-06-28 DIAGNOSIS — K449 Diaphragmatic hernia without obstruction or gangrene: Secondary | ICD-10-CM | POA: Diagnosis not present

## 2024-06-28 DIAGNOSIS — Z743 Need for continuous supervision: Secondary | ICD-10-CM | POA: Diagnosis not present

## 2024-06-28 DIAGNOSIS — A419 Sepsis, unspecified organism: Secondary | ICD-10-CM | POA: Diagnosis not present

## 2024-06-28 DIAGNOSIS — R069 Unspecified abnormalities of breathing: Secondary | ICD-10-CM | POA: Diagnosis not present

## 2024-06-28 DIAGNOSIS — Z66 Do not resuscitate: Secondary | ICD-10-CM | POA: Diagnosis not present

## 2024-06-28 DIAGNOSIS — R93422 Abnormal radiologic findings on diagnostic imaging of left kidney: Secondary | ICD-10-CM | POA: Diagnosis not present

## 2024-06-28 DIAGNOSIS — R918 Other nonspecific abnormal finding of lung field: Secondary | ICD-10-CM | POA: Diagnosis not present

## 2024-06-28 DIAGNOSIS — J9811 Atelectasis: Secondary | ICD-10-CM | POA: Diagnosis not present

## 2024-06-28 DIAGNOSIS — R0600 Dyspnea, unspecified: Secondary | ICD-10-CM | POA: Diagnosis not present

## 2024-06-28 DIAGNOSIS — I110 Hypertensive heart disease with heart failure: Secondary | ICD-10-CM | POA: Diagnosis not present

## 2024-06-28 DIAGNOSIS — R059 Cough, unspecified: Secondary | ICD-10-CM | POA: Diagnosis not present

## 2024-06-28 DIAGNOSIS — G9341 Metabolic encephalopathy: Secondary | ICD-10-CM | POA: Diagnosis not present

## 2024-06-29 DIAGNOSIS — J441 Chronic obstructive pulmonary disease with (acute) exacerbation: Secondary | ICD-10-CM | POA: Diagnosis not present

## 2024-06-29 DIAGNOSIS — J439 Emphysema, unspecified: Secondary | ICD-10-CM | POA: Diagnosis not present

## 2024-06-29 DIAGNOSIS — M545 Low back pain, unspecified: Secondary | ICD-10-CM | POA: Diagnosis not present

## 2024-07-06 DIAGNOSIS — Z9981 Dependence on supplemental oxygen: Secondary | ICD-10-CM | POA: Diagnosis not present

## 2024-07-06 DIAGNOSIS — Z885 Allergy status to narcotic agent status: Secondary | ICD-10-CM | POA: Diagnosis not present

## 2024-07-06 DIAGNOSIS — R0603 Acute respiratory distress: Secondary | ICD-10-CM | POA: Diagnosis not present

## 2024-07-06 DIAGNOSIS — Z79899 Other long term (current) drug therapy: Secondary | ICD-10-CM | POA: Diagnosis not present

## 2024-07-06 DIAGNOSIS — J449 Chronic obstructive pulmonary disease, unspecified: Secondary | ICD-10-CM | POA: Diagnosis not present

## 2024-07-06 DIAGNOSIS — I10 Essential (primary) hypertension: Secondary | ICD-10-CM | POA: Diagnosis not present

## 2024-07-06 DIAGNOSIS — E785 Hyperlipidemia, unspecified: Secondary | ICD-10-CM | POA: Diagnosis not present

## 2024-07-06 DIAGNOSIS — R0602 Shortness of breath: Secondary | ICD-10-CM | POA: Diagnosis not present

## 2024-07-06 DIAGNOSIS — Z87891 Personal history of nicotine dependence: Secondary | ICD-10-CM | POA: Diagnosis not present

## 2024-07-08 DIAGNOSIS — J9611 Chronic respiratory failure with hypoxia: Secondary | ICD-10-CM | POA: Diagnosis not present

## 2024-07-12 ENCOUNTER — Ambulatory Visit (HOSPITAL_BASED_OUTPATIENT_CLINIC_OR_DEPARTMENT_OTHER): Admitting: Pulmonary Disease

## 2024-07-13 ENCOUNTER — Other Ambulatory Visit (HOSPITAL_BASED_OUTPATIENT_CLINIC_OR_DEPARTMENT_OTHER): Payer: Self-pay

## 2024-07-13 MED ORDER — BREZTRI AEROSPHERE 160-9-4.8 MCG/ACT IN AERO: 2.0000 | INHALATION_SPRAY | Freq: Two times a day (BID) | RESPIRATORY_TRACT | 11 refills | Status: DC

## 2024-07-26 ENCOUNTER — Telehealth (HOSPITAL_BASED_OUTPATIENT_CLINIC_OR_DEPARTMENT_OTHER): Payer: Self-pay

## 2024-07-26 NOTE — Telephone Encounter (Signed)
 Copied from CRM #[id number]. Topic: Clinical - Prescription Issue >> Jul 26, 2024  9:13 AM Leila C wrote: Reason for CRM: Katheryn from [facility] pharmacy [phone] and fax# [phone] Attention: Katheryn, states for Ohtuvayre  nebulizer prescriptions, [place] [employer] has strict guidelines and needs PFT/spirometry report has to have pre and post for medication coverage. Please advise and call back.

## 2024-07-28 ENCOUNTER — Telehealth: Payer: Self-pay | Admitting: Pulmonary Disease

## 2024-07-28 NOTE — Telephone Encounter (Signed)
 Copied from CRM #[id number]. Topic: General - Deceased Patient >> 08-12-24  1:37 PM Ismael A wrote: Name of caller: Michaelpaul Apo   Date of death: 2024-08-10   Name of funeral home: [facility]  Phone number of funeral home: [phone]  Provider that needs to sign form: Dr. Kassie  Timeline for signing: n/a   Please see notification for provider signature regarding patient's death.

## 2024-07-28 DEATH — deceased

## 2024-09-15 ENCOUNTER — Ambulatory Visit (HOSPITAL_BASED_OUTPATIENT_CLINIC_OR_DEPARTMENT_OTHER): Admitting: Pulmonary Disease
